# Patient Record
Sex: Female | Born: 1980 | Hispanic: No | Marital: Single | Smoking: Former smoker
Health system: Southern US, Community
[De-identification: ages and names within clinical notes are randomized; demographics above are authoritative.]

## PROBLEM LIST (undated history)

## (undated) DIAGNOSIS — J45909 Unspecified asthma, uncomplicated: Secondary | ICD-10-CM

## (undated) DIAGNOSIS — B1920 Unspecified viral hepatitis C without hepatic coma: Secondary | ICD-10-CM

## (undated) DIAGNOSIS — B9689 Other specified bacterial agents as the cause of diseases classified elsewhere: Secondary | ICD-10-CM

## (undated) DIAGNOSIS — N760 Acute vaginitis: Secondary | ICD-10-CM

## (undated) DIAGNOSIS — K047 Periapical abscess without sinus: Secondary | ICD-10-CM

## (undated) HISTORY — DX: Unspecified viral hepatitis C without hepatic coma: B19.20

## (undated) HISTORY — DX: Other specified bacterial agents as the cause of diseases classified elsewhere: B96.89

## (undated) HISTORY — DX: Other specified bacterial agents as the cause of diseases classified elsewhere: N76.0

## (undated) HISTORY — PX: TYMPANOSTOMY TUBE PLACEMENT: SHX32

---

## 2008-09-23 ENCOUNTER — Emergency Department (HOSPITAL_COMMUNITY): Admission: EM | Admit: 2008-09-23 | Discharge: 2008-09-23 | Payer: Self-pay | Admitting: Emergency Medicine

## 2009-03-24 ENCOUNTER — Emergency Department (HOSPITAL_COMMUNITY): Admission: EM | Admit: 2009-03-24 | Discharge: 2009-03-24 | Payer: Self-pay | Admitting: Orthopaedic Surgery

## 2009-03-25 ENCOUNTER — Emergency Department (HOSPITAL_COMMUNITY): Admission: EM | Admit: 2009-03-25 | Discharge: 2009-03-25 | Payer: Self-pay | Admitting: Emergency Medicine

## 2009-04-24 ENCOUNTER — Emergency Department (HOSPITAL_COMMUNITY): Admission: EM | Admit: 2009-04-24 | Discharge: 2009-04-24 | Payer: Self-pay | Admitting: Emergency Medicine

## 2009-11-30 ENCOUNTER — Emergency Department (HOSPITAL_COMMUNITY): Admission: EM | Admit: 2009-11-30 | Discharge: 2009-11-30 | Payer: Self-pay | Admitting: Emergency Medicine

## 2010-08-04 ENCOUNTER — Emergency Department (HOSPITAL_COMMUNITY)
Admission: EM | Admit: 2010-08-04 | Discharge: 2010-08-04 | Payer: Self-pay | Source: Home / Self Care | Admitting: Emergency Medicine

## 2010-08-04 LAB — WET PREP, GENITAL: Trich, Wet Prep: NONE SEEN

## 2010-08-04 LAB — CBC
MCH: 29.4 pg (ref 26.0–34.0)
MCHC: 32.4 g/dL (ref 30.0–36.0)
Platelets: 226 10*3/uL (ref 150–400)
RDW: 13.1 % (ref 11.5–15.5)

## 2010-08-04 LAB — URINALYSIS, ROUTINE W REFLEX MICROSCOPIC
Bilirubin Urine: NEGATIVE
Ketones, ur: NEGATIVE mg/dL
Nitrite: NEGATIVE
Protein, ur: NEGATIVE mg/dL

## 2010-08-06 LAB — GC/CHLAMYDIA PROBE AMP, GENITAL: Chlamydia, DNA Probe: NEGATIVE

## 2010-09-24 LAB — WET PREP, GENITAL
Clue Cells Wet Prep HPF POC: NONE SEEN
Trich, Wet Prep: NONE SEEN
Yeast Wet Prep HPF POC: NONE SEEN

## 2010-09-24 LAB — GC/CHLAMYDIA PROBE AMP, GENITAL: Chlamydia, DNA Probe: NEGATIVE

## 2010-09-24 LAB — URINALYSIS, ROUTINE W REFLEX MICROSCOPIC
Glucose, UA: NEGATIVE mg/dL
Hgb urine dipstick: NEGATIVE
Ketones, ur: NEGATIVE mg/dL
Nitrite: NEGATIVE
Protein, ur: NEGATIVE mg/dL
Specific Gravity, Urine: 1.029 (ref 1.005–1.030)
Urobilinogen, UA: 0.2 mg/dL (ref 0.0–1.0)
pH: 5 (ref 5.0–8.0)

## 2010-09-24 LAB — PREGNANCY, URINE: Preg Test, Ur: NEGATIVE

## 2010-10-11 LAB — GC/CHLAMYDIA PROBE AMP, GENITAL: Chlamydia, DNA Probe: NEGATIVE

## 2010-10-11 LAB — POCT PREGNANCY, URINE: Preg Test, Ur: NEGATIVE

## 2010-10-11 LAB — WET PREP, GENITAL: Clue Cells Wet Prep HPF POC: NONE SEEN

## 2010-10-12 LAB — RAPID STREP SCREEN (MED CTR MEBANE ONLY): Streptococcus, Group A Screen (Direct): NEGATIVE

## 2010-11-24 ENCOUNTER — Emergency Department (HOSPITAL_COMMUNITY): Payer: Medicaid Other

## 2010-11-24 ENCOUNTER — Emergency Department (HOSPITAL_COMMUNITY)
Admission: EM | Admit: 2010-11-24 | Discharge: 2010-11-24 | Disposition: A | Discharge status: Home / Self Care | Payer: Medicaid Other | Attending: Emergency Medicine | Admitting: Emergency Medicine

## 2010-11-24 DIAGNOSIS — J069 Acute upper respiratory infection, unspecified: Secondary | ICD-10-CM | POA: Insufficient documentation

## 2010-11-24 DIAGNOSIS — R079 Chest pain, unspecified: Secondary | ICD-10-CM | POA: Insufficient documentation

## 2010-11-24 DIAGNOSIS — R509 Fever, unspecified: Secondary | ICD-10-CM | POA: Insufficient documentation

## 2010-11-24 DIAGNOSIS — R05 Cough: Secondary | ICD-10-CM | POA: Insufficient documentation

## 2010-11-24 DIAGNOSIS — R059 Cough, unspecified: Secondary | ICD-10-CM | POA: Insufficient documentation

## 2010-11-24 DIAGNOSIS — R093 Abnormal sputum: Secondary | ICD-10-CM | POA: Insufficient documentation

## 2010-11-24 DIAGNOSIS — R071 Chest pain on breathing: Secondary | ICD-10-CM | POA: Insufficient documentation

## 2011-05-21 ENCOUNTER — Emergency Department (HOSPITAL_COMMUNITY)
Admission: EM | Admit: 2011-05-21 | Discharge: 2011-05-21 | Disposition: A | Discharge status: Home / Self Care | Payer: Medicaid Other | Attending: Emergency Medicine | Admitting: Emergency Medicine

## 2011-05-21 DIAGNOSIS — B9789 Other viral agents as the cause of diseases classified elsewhere: Secondary | ICD-10-CM | POA: Insufficient documentation

## 2011-05-21 DIAGNOSIS — R5383 Other fatigue: Secondary | ICD-10-CM | POA: Insufficient documentation

## 2011-05-21 DIAGNOSIS — B349 Viral infection, unspecified: Secondary | ICD-10-CM

## 2011-05-21 DIAGNOSIS — R05 Cough: Secondary | ICD-10-CM | POA: Insufficient documentation

## 2011-05-21 DIAGNOSIS — R110 Nausea: Secondary | ICD-10-CM | POA: Insufficient documentation

## 2011-05-21 DIAGNOSIS — R5381 Other malaise: Secondary | ICD-10-CM | POA: Insufficient documentation

## 2011-05-21 DIAGNOSIS — R109 Unspecified abdominal pain: Secondary | ICD-10-CM | POA: Insufficient documentation

## 2011-05-21 DIAGNOSIS — R197 Diarrhea, unspecified: Secondary | ICD-10-CM | POA: Insufficient documentation

## 2011-05-21 DIAGNOSIS — R059 Cough, unspecified: Secondary | ICD-10-CM | POA: Insufficient documentation

## 2011-05-21 LAB — URINALYSIS, ROUTINE W REFLEX MICROSCOPIC
Leukocytes, UA: NEGATIVE
Nitrite: NEGATIVE
Specific Gravity, Urine: 1.026 (ref 1.005–1.030)
Urobilinogen, UA: 1 mg/dL (ref 0.0–1.0)
pH: 6 (ref 5.0–8.0)

## 2011-05-21 LAB — PREGNANCY, URINE: Preg Test, Ur: NEGATIVE

## 2011-05-21 MED ORDER — OXYCODONE-ACETAMINOPHEN 5-325 MG PO TABS: 1.0000 | ORAL_TABLET | ORAL | Status: AC | PRN

## 2011-05-21 MED ORDER — OXYCODONE-ACETAMINOPHEN 5-325 MG PO TABS
1.0000 | ORAL_TABLET | Freq: Once | ORAL | Status: DC
  Filled 2011-05-21: qty 1

## 2011-05-21 NOTE — ED Provider Notes (Signed)
History     CSN: [id number] Arrival date & time: 05/21/2011  3:15 PM   None     Chief Complaint  Patient presents with  . Abdominal Pain    (Consider location/radiation/quality/duration/timing/severity/associated sxs/prior treatment) Patient is a 30 y.o. female presenting with abdominal pain. The history is provided by the patient.  Abdominal Pain The primary symptoms of the illness include abdominal pain, fatigue, nausea and diarrhea. The primary symptoms of the illness do not include shortness of breath. The current episode started more than 2 days ago. The onset of the illness was gradual.  Symptoms associated with the illness do not include back pain.   patient has had nauseousness and some diarrhea the last few days. She is gravida itchy throat is not painful. She states her daughters had the same symptoms. She has an occasional cough. Mild abdominal pain. No rebound or guarding. She has no relief with aspirin ibuprofen. She states her muscles do not hurt.  History reviewed. No pertinent past medical history.  Past Surgical History  Procedure Date  . Tympanostomy tube placement     History reviewed. No pertinent family history.  History  Substance Use Topics  . Smoking status: Never Smoker   . Smokeless tobacco: Not on file  . Alcohol Use: No    OB History    Grav Para Term Preterm Abortions TAB SAB Ect Mult Living                  Review of Systems  Constitutional: Positive for appetite change and fatigue. Negative for activity change.  HENT: Negative for rhinorrhea, trouble swallowing, neck stiffness and sinus pressure.   Eyes: Negative for pain.  Respiratory: Negative for chest tightness and shortness of breath.   Cardiovascular: Negative for chest pain and leg swelling.  Gastrointestinal: Positive for nausea, abdominal pain and diarrhea.  Genitourinary: Negative for flank pain.       Patient states that she could be pregnant. Her last period was early in  the month.  Musculoskeletal: Negative for myalgias and back pain.  Skin: Negative for rash.  Neurological: Negative for weakness, numbness and headaches.  Psychiatric/Behavioral: Negative for behavioral problems.    Allergies  Review of patient's allergies indicates no known allergies.  Home Medications   Current Outpatient Rx  Name Route Sig Dispense Refill  . ASPIRIN 325 MG PO TABS Oral Take 325 mg by mouth daily as needed. For pain     . IBUPROFEN 200 MG PO TABS Oral Take 400 mg by mouth every 6 (six) hours as needed. For pain     . OXYCODONE-ACETAMINOPHEN 5-325 MG PO TABS Oral Take 1 tablet by mouth every 4 (four) hours as needed for pain. 15 tablet 0    BP 117/76  Pulse 104  Temp(Src) 99 F (37.2 C) (Oral)  Resp 20  SpO2 100%  LMP 05/09/2011  Physical Exam  Nursing note and vitals reviewed. Constitutional: She is oriented to person, place, and time. She appears well-developed and well-nourished.  HENT:  Head: Normocephalic and atraumatic.  Eyes: EOM are normal. Pupils are equal, round, and reactive to light.  Neck: Normal range of motion. Neck supple. No tracheal deviation present. No thyromegaly present.  Cardiovascular: Normal rate, regular rhythm and normal heart sounds.   No murmur heard. Pulmonary/Chest: Effort normal and breath sounds normal. No respiratory distress. She has no wheezes. She has no rales.  Abdominal: Soft. Bowel sounds are normal. She exhibits no distension. There is no tenderness. There  is no rebound and no guarding.  Musculoskeletal: Normal range of motion.  Lymphadenopathy:    She has no cervical adenopathy.  Neurological: She is alert and oriented to person, place, and time. No cranial nerve deficit.  Skin: Skin is warm and dry.  Psychiatric: She has a normal mood and affect. Her speech is normal.    ED Course  Procedures (including critical care time)  Labs Reviewed  URINALYSIS, ROUTINE W REFLEX MICROSCOPIC - Abnormal; Notable for the  following:    Bilirubin Urine SMALL (*)    Ketones, ur 40 (*)    All other components within normal limits  PREGNANCY, URINE   No results found.   1. Viral infection       MDM  Abdominal pain and sore throat. Feeling bad overall. She's had recent sick contacts. Some mild dehydration on urinalysis. She's tolerating orals she'll be discharged home.        Juliet Rude. Rubin Payor, MD 05/21/11 1704

## 2011-05-21 NOTE — ED Notes (Signed)
Pt c/o abd pain and an "itchy, scratchy" throat.

## 2011-08-13 ENCOUNTER — Emergency Department (HOSPITAL_COMMUNITY)
Admission: EM | Admit: 2011-08-13 | Discharge: 2011-08-13 | Disposition: A | Discharge status: Home / Self Care | Payer: Self-pay | Attending: Emergency Medicine | Admitting: Emergency Medicine

## 2011-08-13 ENCOUNTER — Encounter (HOSPITAL_COMMUNITY): Payer: Self-pay | Admitting: *Deleted

## 2011-08-13 ENCOUNTER — Emergency Department (HOSPITAL_COMMUNITY): Payer: Self-pay

## 2011-08-13 DIAGNOSIS — R059 Cough, unspecified: Secondary | ICD-10-CM | POA: Insufficient documentation

## 2011-08-13 DIAGNOSIS — J069 Acute upper respiratory infection, unspecified: Secondary | ICD-10-CM | POA: Insufficient documentation

## 2011-08-13 DIAGNOSIS — R070 Pain in throat: Secondary | ICD-10-CM | POA: Insufficient documentation

## 2011-08-13 DIAGNOSIS — J3489 Other specified disorders of nose and nasal sinuses: Secondary | ICD-10-CM | POA: Insufficient documentation

## 2011-08-13 DIAGNOSIS — R05 Cough: Secondary | ICD-10-CM | POA: Insufficient documentation

## 2011-08-13 NOTE — ED Provider Notes (Signed)
History     CSN: [id number]  Arrival date & time 08/13/11  1350   First MD Initiated Contact with Patient 08/13/11 1546      Chief Complaint  Patient presents with  . URI    (Consider location/radiation/quality/duration/timing/severity/associated sxs/prior treatment) HPI Comments: Also today with a cough she's had some inspiratory chest pain. It is diffuse a 3/10 and sharp in nature.  Patient is a 31 y.o. female presenting with URI. The history is provided by the patient.  URI The primary symptoms include sore throat and cough. Primary symptoms do not include fever, ear pain, abdominal pain, nausea or vomiting. The current episode started 3 to 5 days ago. This is a new problem. The problem has not changed since onset. The cough began 3 to 5 days ago. The cough is non-productive, dry and hacking.  The onset of the illness is associated with exposure to sick contacts. Symptoms associated with the illness include congestion and rhinorrhea. The illness is not associated with sinus pressure.    History reviewed. No pertinent past medical history.  Past Surgical History  Procedure Date  . Tympanostomy tube placement     No family history on file.  History  Substance Use Topics  . Smoking status: Former Games developer  . Smokeless tobacco: Not on file  . Alcohol Use: No    OB History    Grav Para Term Preterm Abortions TAB SAB Ect Mult Living                  Review of Systems  Constitutional: Negative for fever.  HENT: Positive for congestion, sore throat and rhinorrhea. Negative for ear pain and sinus pressure.   Respiratory: Positive for cough.   Gastrointestinal: Negative for nausea, vomiting and abdominal pain.  All other systems reviewed and are negative.    Allergies  Review of patient's allergies indicates no known allergies.  Home Medications   Current Outpatient Rx  Name Route Sig Dispense Refill  . ASPIRIN 325 MG PO TABS Oral Take 325 mg by mouth daily as  needed. For pain     . IBUPROFEN 200 MG PO TABS Oral Take 400 mg by mouth every 6 (six) hours as needed. For pain       BP 131/85  Pulse 79  Temp(Src) 98 F (36.7 C) (Oral)  Resp 24  Ht 5\' 9"  (1.753 m)  Wt 280 lb (127.007 kg)  BMI 41.35 kg/m2  SpO2 100%  LMP 07/23/2011  Physical Exam  Nursing note and vitals reviewed. Constitutional: She is oriented to person, place, and time. She appears well-developed and well-nourished. No distress.  HENT:  Head: Normocephalic and atraumatic.  Right Ear: Tympanic membrane and ear canal normal.  Left Ear: Tympanic membrane and ear canal normal.  Nose: Mucosal edema and rhinorrhea present.  Mouth/Throat: Posterior oropharyngeal erythema present.  Eyes: Conjunctivae and EOM are normal. Pupils are equal, round, and reactive to light.  Neck: Normal range of motion. Neck supple.  Cardiovascular: Normal rate, regular rhythm and intact distal pulses.   No murmur heard. Pulmonary/Chest: Effort normal and breath sounds normal. No respiratory distress. She has no wheezes. She has no rales.  Abdominal: Soft. She exhibits no distension. There is no tenderness. There is no rebound and no guarding.  Musculoskeletal: Normal range of motion. She exhibits no edema and no tenderness.  Neurological: She is alert and oriented to person, place, and time.  Skin: Skin is warm and dry. No rash noted. No erythema.  Psychiatric: She has a normal mood and affect. Her behavior is normal.    ED Course  Procedures (including critical care time)  Labs Reviewed - No data to display Dg Chest 2 View  08/13/2011  *RADIOLOGY REPORT*  Clinical Data: Cough and chest pain for 3 days.  CHEST - 2 VIEW  Comparison: None.  Findings: Normal heart size with clear lung fields.  No bony abnormality.  No change from priors.  IMPRESSION: No active cardiopulmonary disease.  Original Report Authenticated By: Elsie Stain, M.D.     No diagnosis found.    MDM   Pt with symptoms  consistent with viral URI.  Well appearing here.  No signs of breathing difficulty  No signs of pharyngitis, otitis or abnormal abdominal findings.   CXR wnl and pt to return with any further problems.         Gwyneth Sprout, MD 08/13/11 [phone]

## 2011-08-13 NOTE — ED Notes (Signed)
Pt is her with cold symptoms nose drainage sneezing and then congestion and pain with deep breath in chest

## 2011-09-05 ENCOUNTER — Emergency Department (HOSPITAL_COMMUNITY)
Admission: EM | Admit: 2011-09-05 | Discharge: 2011-09-05 | Disposition: A | Discharge status: Home / Self Care | Payer: Self-pay | Attending: Emergency Medicine | Admitting: Emergency Medicine

## 2011-09-05 ENCOUNTER — Encounter (HOSPITAL_COMMUNITY): Payer: Self-pay | Admitting: Emergency Medicine

## 2011-09-05 DIAGNOSIS — M542 Cervicalgia: Secondary | ICD-10-CM | POA: Insufficient documentation

## 2011-09-05 DIAGNOSIS — K0381 Cracked tooth: Secondary | ICD-10-CM | POA: Insufficient documentation

## 2011-09-05 DIAGNOSIS — R599 Enlarged lymph nodes, unspecified: Secondary | ICD-10-CM | POA: Insufficient documentation

## 2011-09-05 DIAGNOSIS — K029 Dental caries, unspecified: Secondary | ICD-10-CM | POA: Insufficient documentation

## 2011-09-05 DIAGNOSIS — S025XXA Fracture of tooth (traumatic), initial encounter for closed fracture: Secondary | ICD-10-CM

## 2011-09-05 DIAGNOSIS — K089 Disorder of teeth and supporting structures, unspecified: Secondary | ICD-10-CM | POA: Insufficient documentation

## 2011-09-05 DIAGNOSIS — K0889 Other specified disorders of teeth and supporting structures: Secondary | ICD-10-CM

## 2011-09-05 LAB — POCT PREGNANCY, URINE: Preg Test, Ur: NEGATIVE

## 2011-09-05 MED ORDER — PENICILLIN V POTASSIUM 500 MG PO TABS: 500.0000 mg | ORAL_TABLET | Freq: Three times a day (TID) | ORAL | Status: AC

## 2011-09-05 MED ORDER — HYDROCODONE-ACETAMINOPHEN 7.5-325 MG/15ML PO SOLN: 15.0000 mL | ORAL | Status: AC | PRN

## 2011-09-05 MED ORDER — IBUPROFEN 800 MG PO TABS
800.0000 mg | ORAL_TABLET | Freq: Once | ORAL | Status: AC
  Administered 2011-09-05: 800 mg via ORAL
  Filled 2011-09-05: qty 1

## 2011-09-05 MED ORDER — HYDROCODONE-ACETAMINOPHEN 5-325 MG PO TABS
1.0000 | ORAL_TABLET | Freq: Once | ORAL | Status: AC
  Administered 2011-09-05: 1 via ORAL
  Filled 2011-09-05: qty 1

## 2011-09-05 NOTE — ED Notes (Signed)
Pt st's she has had sore throat since this am

## 2011-09-05 NOTE — Discharge Instructions (Signed)
Please read over the instructions below. The swelling lymph node in your left neck is likely a response to your recent cold symptoms or your infected tooth on the left upper side. We are starting you on an antibiotic to treat the tooth for several days before your follow up with his dentist. Take as directed and be sure to finish. If the swollen lymph nodes persist after you have had the tooth pulled you will need to follow up with an ear nose and throat physician we have provided the referral. We have also attached several pages of free and/or low-cost dental resources in the community for you to arrange follow up with a dentist. We have also provided primary care referrals as discussed.  Dental Caries  Tooth decay (dental caries, cavities) is the most common of all oral diseases. It occurs in all ages but is more common in children and young adults.  CAUSES  Bacteria in your mouth combine with foods (particularly sugars and starches) to produce plaque. Plaque is a substance that sticks to the hard surfaces of teeth. The bacteria in the plaque produce acids that attack the enamel of teeth. Repeated acid attacks dissolve the enamel and create holes in the teeth. Root surfaces of teeth may also get these holes.  Other contributing factors include:   Frequent snacking and drinking of cavity-producing foods and liquids.   Poor oral hygiene.   Dry mouth.   Substance abuse such as methamphetamine.   Broken or poor fitting dental restorations.   Eating disorders.   Gastroesophageal reflux disease (GERD).   Certain radiation treatments to the head and neck.  SYMPTOMS  At first, dental decay appears as white, chalky areas on the enamel. In this early stage, symptoms are seldom present. As the decay progresses, pits and holes may appear on the enamel surfaces. Progression of the decay will lead to softening of the hard layers of the tooth. At this point you may experience some pain or achy feeling  after sweet, hot, or cold foods or drinks are consumed. If left untreated, the decay will reach the internal structures of the tooth and produce severe pain. Extensive dental treatment, such as root canal therapy, may be needed to save the tooth at this late stage of decay development.  DIAGNOSIS  Most cavities will be detected during regular check-ups. A thorough medical and dental history will be taken by the dentist. The dentist will use instruments to check the surfaces of your teeth for any breakdown or discoloration. Some dentists have special instruments, such as lasers, that detect tooth decay. Dental X-rays may also show some cavities that are not visible to the eye (such as between the contact areas of the teeth). TREATMENT  Treatment involves removal of the tooth decay and replacement with a restorative material such as silver, gold, or composite (white) material. However, if the decay involves a large area of the tooth and there is little remaining healthy tooth structure, a cap (crown) will be fitted over the remaining structure. If the decay involves the center part of the tooth (pulp), root canal treatment will be needed before any type of dental restoration is placed. If the tooth is severely destroyed by the decay process, leaving the remaining tooth structures unrestorable, the tooth will need to be pulled (extracted). Some early tooth decay may be reversed by fluoride treatments and thorough brushing and flossing at home. PREVENTION   Eat healthy foods. Restrict the amount of sugary, starchy foods and liquids you  consume. Avoid frequent snacking and drinking of unhealthy foods and liquids.   Sealants can help with prevention of cavities. Sealants are composite resins applied onto the biting surfaces of teeth at risk for decay. They smooth out the pits and grooves and prevent food from being trapped in them. This is done in early childhood before tooth decay has started.   Fluoride  tablets may also be prescribed to children between 6 months and 65 years of age if your drinking water is not fluoridated. The fluoride absorbed by the tooth enamel makes teeth less susceptible to decay. Thorough daily cleaning with a toothbrush and dental floss is the best way to prevent cavities. Use of a fluoride toothpaste is highly recommended. Fluoride mouth rinses may be used in specific cases.   Topical application of fluoride by your dentist is important in children.   Regular visits with a dentist for checkups and cleanings are also important.  SEEK IMMEDIATE DENTAL CARE IF:  You have a fever.   You develop redness and swelling of your face, jaw, or neck.   You develop swelling around a tooth.   You are unable to open your mouth or cannot swallow.   You have severe pain uncontrolled by pain medicine.  Document Released: 03/16/2002 Document Revised: 03/06/2011 Document Reviewed: 11/29/2010 [facility] Patient Information 2012 [place], [place]  .Dental Fracture You have a dental fracture or injury. This can mean the tooth is loose, has a chip in the enamel or is broken. If just the outer enamel is chipped, there is a good chance the tooth will not become infected. The only treatment needed may be to smooth off a rough edge. Fractures into the deeper layers (dentin and pulp) cause greater pain and are more likely to become infected. These require you to see a dentist as soon as possible to save the tooth. Loose teeth may need to be wired or bonded with a plastic splint to hold them in place. A paste may be painted on the open area of the broken tooth to reduce the pain. Antibiotics and pain medicine may be prescribed. Choosing a soft or liquid diet and rinsing the mouth out with warm water after meals may be helpful. See your dentist as recommended. Failure to seek care or follow up with a dentist or other specialist as recommended could result in the loss of your tooth, infection, or  permanent dental problems. SEEK MEDICAL CARE IF:   You have increased pain not controlled with medicines.   You have swelling around the tooth, in the face or neck.   You have bleeding which starts, continues, or gets worse.   You have a fever.  Document Released: 08/01/2004 Document Revised: 03/06/2011 Document Reviewed: 05/16/2009 [facility] Patient Information 2012 [place], [place].  Lymphadenopathy Lymphadenopathy means "disease of the lymph glands." But the term is usually used to describe swollen or enlarged lymph glands, also called lymph nodes. These are the bean-shaped organs found in many locations including the neck, underarm, and groin. Lymph glands are part of the immune system, which fights infections in your body. Lymphadenopathy can occur in just one area of the body, such as the neck, or it can be generalized, with lymph node enlargement in several areas. The nodes found in the neck are the most common sites of lymphadenopathy. CAUSES  When your immune system responds to germs (such as viruses or bacteria ), infection-fighting cells and fluid build up. This causes the glands to grow in size. This is usually  not something to worry about. Sometimes, the glands themselves can become infected and inflamed. This is called lymphadenitis. Enlarged lymph nodes can be caused by many diseases:  Bacterial disease, such as strep throat or a skin infection.   Viral disease, such as a common cold.   Other germs, such as lyme disease, tuberculosis, or sexually transmitted diseases.   Cancers, such as lymphoma (cancer of the lymphatic system) or leukemia (cancer of the white blood cells).   Inflammatory diseases such as lupus or rheumatoid arthritis.   Reactions to medications.  Many of the diseases above are rare, but important. This is why you should see your caregiver if you have lymphadenopathy. SYMPTOMS   Swollen, enlarged lumps in the neck, back of the head or other locations.     Tenderness.   Warmth or redness of the skin over the lymph nodes.   Fever.  DIAGNOSIS  Enlarged lymph nodes are often near the source of infection. They can help healthcare providers diagnose your illness. For instance:   Swollen lymph nodes around the jaw might be caused by an infection in the mouth.   Enlarged glands in the neck often signal a throat infection.   Lymph nodes that are swollen in more than one area often indicate an illness caused by a virus.  Your caregiver most likely will know what is causing your lymphadenopathy after listening to your history and examining you. Blood tests, x-rays or other tests may be needed. If the cause of the enlarged lymph node cannot be found, and it does not go away by itself, then a biopsy may be needed. Your caregiver will discuss this with you. TREATMENT  Treatment for your enlarged lymph nodes will depend on the cause. Many times the nodes will shrink to normal size by themselves, with no treatment. Antibiotics or other medicines may be needed for infection. Only take over-the-counter or prescription medicines for pain, discomfort or fever as directed by your caregiver. HOME CARE INSTRUCTIONS  Swollen lymph glands usually return to normal when the underlying medical condition goes away. If they persist, contact your health-care provider. He/she might prescribe antibiotics or other treatments, depending on the diagnosis. Take any medications exactly as prescribed. Keep any follow-up appointments made to check on the condition of your enlarged nodes.  SEEK MEDICAL CARE IF:   Swelling lasts for more than two weeks.   You have symptoms such as weight loss, night sweats, fatigue or fever that does not go away.   The lymph nodes are hard, seem fixed to the skin or are growing rapidly.   Skin over the lymph nodes is red and inflamed. This could mean there is an infection.  SEEK IMMEDIATE MEDICAL CARE IF:   Fluid starts leaking from the area  of the enlarged lymph node.   You develop a fever of 102 F (38.9 C) or greater.   Severe pain develops (not necessarily at the site of a large lymph node).   You develop chest pain or shortness of breath.   You develop worsening abdominal pain.  MAKE SURE YOU:   Understand these instructions.   Will watch your condition.   Will get help right away if you are not doing well or get worse.  Document Released: 04/02/2008 Document Revised: 03/06/2011 Document Reviewed: 04/02/2008 [facility] Patient Information 2012 [place], [place].  RESOURCE GUIDE  Dental Problems  Patients with Medicaid: [facility]  [facility] [address]                                           [phone] [address] [employer] [address] Phone:  [phone]                                                  Phone:  [phone]  If unable to pay or uninsured, contact:  [facility] or [facility]. to become qualified for the adult dental clinic.  Chronic Pain Problems Contact [facility]  [phone] Patients need to be referred by their primary care doctor.  Insufficient Money for Medicine Contact [employer]:  call "211" or [facility] 307-213-2929.  No Primary Care Doctor Call [facility]  6231910914 Other agencies that provide inexpensive medical care    [facility]  [phone]    [facility]  [phone]    [facility]  [phone]    [facility]  [phone]    Planned Parenthood  [phone]    [facility]  [phone]  Psychological Services [facility] Behavioral Health  [phone] [facility]  [phone] [facility] Mental Health   [phone] (emergency services 201-615-8293)  Substance Abuse Resources [facility]  (507)688-3316 [facility] [phone] [facility] [place] [phone] Floydene Flock 209-494-3612 Residential & Outpatient Substance Abuse Program   [phone]  Abuse/Neglect [facility] Child Abuse Hotline (205)579-2354 [facility] Child Abuse Hotline 647-761-0623 (After Hours)  Emergency Shelter [facility] 959-398-7016  Maternity Homes [facility] [place] [facility] 6846933397 [facility] Services [phone]  MRSA Hotline #:   762-178-2446    [facility] Resources  [facility]     [employer]                          [facility] Dept. [address] [place]                       [address]      [address]  Blondell Reveal Phone:  [phone]                                   Phone:  [phone]                 Phone:  [phone]  [facility] Mental Health Phone:  [phone]  [facility] Child Abuse Hotline 817-785-8201 [phone] (After Hours)

## 2011-09-05 NOTE — ED Notes (Addendum)
Pt reports having a 'bump' under her chin that she noticed hurts this morning when she messes with it.  States that she has had a runny nose.  Denies sore throat.  Pt noted to have a small lymph node that is swollen under her chin.

## 2011-09-07 NOTE — ED Provider Notes (Signed)
History     CSN: [id number]  Arrival date & time 09/05/11  1710   First MD Initiated Contact with Patient 09/05/11 2118      Chief Complaint  Patient presents with  . Sore Throat     Patient is a 31 y.o. female presenting with pharyngitis.  Sore Throat This is a new problem. The current episode started today. The problem occurs constantly. The problem has been gradually worsening. Associated symptoms include neck pain and swollen glands. Pertinent negatives include no fever or sore throat. She has tried nothing for the symptoms.  Patient reports she awoke this morning with a painful, swollen nodule to her left neck. Admits to cold symptoms for approximately one week. Was seen in ED 08/13/2011 for cold sx's and dx'd w/ a viral URI. Also states she has had intermittent tooth pain to the left upper tooth that has worsened over the last one to 2 days,  due to a dental carie and partial tooth fracture fracture.   History reviewed. No pertinent past medical history.  Past Surgical History  Procedure Date  . Tympanostomy tube placement     History reviewed. No pertinent family history.  History  Substance Use Topics  . Smoking status: Former Games developer  . Smokeless tobacco: Not on file  . Alcohol Use: No    OB History    Grav Para Term Preterm Abortions TAB SAB Ect Mult Living                  Review of Systems  Constitutional: Negative.  Negative for fever.  HENT: Positive for neck pain. Negative for sore throat.   Eyes: Negative.   Respiratory: Negative.   Cardiovascular: Negative.   Gastrointestinal: Negative.   Genitourinary: Negative.   Skin: Negative.   Neurological: Negative.   Hematological: Negative.   Psychiatric/Behavioral: Negative.     Allergies  Review of patient's allergies indicates no known allergies.  Home Medications   Current Outpatient Rx  Name Route Sig Dispense Refill  . OVER THE COUNTER MEDICATION Oral Take 2 capsules by mouth 2 (two) times  daily as needed. For allergies  Over the counter allergy medication    . HYDROCODONE-ACETAMINOPHEN 7.5-325 MG/15ML PO SOLN Oral Take 15 mLs by mouth every 4 (four) hours as needed for pain. 50 mL 0  . PENICILLIN V POTASSIUM 500 MG PO TABS Oral Take 1 tablet (500 mg total) by mouth 3 (three) times daily. 30 tablet 0    BP 131/78  Pulse 78  Temp(Src) 98 F (36.7 C) (Oral)  Resp 20  SpO2 96%  LMP 07/23/2011  Physical Exam  Constitutional: She is oriented to person, place, and time. She appears well-developed and well-nourished.  HENT:  Head: Normocephalic and atraumatic.  Right Ear: Hearing, tympanic membrane, external ear and ear canal normal.  Left Ear: Hearing, tympanic membrane, external ear and ear canal normal.  Nose: Nose normal.  Mouth/Throat: Uvula is midline, oropharynx is clear and moist and mucous membranes are normal.    Eyes: Conjunctivae are normal.  Neck: Neck supple.         Small approx .5 cm swollen, anterior cerv lymph node to (L) neck that is TTP. No soft tissue swelling or erythema.  Cardiovascular: Normal rate and regular rhythm.   Pulmonary/Chest: Effort normal and breath sounds normal.  Abdominal: Soft. Bowel sounds are normal.  Musculoskeletal: Normal range of motion.  Neurological: She is alert and oriented to person, place, and time.  Skin: Skin is  warm and dry. No erythema.  Psychiatric: She has a normal mood and affect.    ED Course  Procedures   Findings and clinical impression discussed with patient. Will treat with antibiotics for obvious tooth decay and possible abscess to left upper second bicuspid. Will prescribe a short course of medication for pain and provide referrals for dental follow up.   Labs Reviewed  POCT PREGNANCY, URINE  LAB REPORT - SCANNED   No results found.   1. Swollen lymph nodes   2. Pain, dental   3. Dental caries   4. Fracture of tooth       MDM  HPI/PE and clinical findings c/w 1. lymphadenopathy  (swollen, tender .5 cm (L) cervical lymph node likely related to URI and/or advanced dental carie to (L) upper 2nd bicuspid 2. Dental carie/old dental fracture/Dental pain        Leanne Chang, NP 09/08/11 [phone]

## 2011-09-08 NOTE — ED Provider Notes (Signed)
Medical screening examination/treatment/procedure(s) were performed by non-physician practitioner and as supervising physician I was immediately available for consultation/collaboration.  Juliet Rude. Rubin Payor, MD 09/08/11 1610

## 2012-02-04 ENCOUNTER — Encounter (HOSPITAL_COMMUNITY): Payer: Self-pay

## 2012-02-04 ENCOUNTER — Emergency Department (HOSPITAL_COMMUNITY)
Admission: EM | Admit: 2012-02-04 | Discharge: 2012-02-04 | Disposition: A | Discharge status: Home / Self Care | Payer: Self-pay | Attending: Emergency Medicine | Admitting: Emergency Medicine

## 2012-02-04 DIAGNOSIS — M545 Low back pain, unspecified: Secondary | ICD-10-CM | POA: Insufficient documentation

## 2012-02-04 DIAGNOSIS — Z87891 Personal history of nicotine dependence: Secondary | ICD-10-CM | POA: Insufficient documentation

## 2012-02-04 DIAGNOSIS — M549 Dorsalgia, unspecified: Secondary | ICD-10-CM

## 2012-02-04 LAB — POCT I-STAT, CHEM 8
BUN: 12 mg/dL (ref 6–23)
Calcium, Ion: 1.23 mmol/L (ref 1.12–1.23)
Chloride: 104 meq/L (ref 96–112)
Creatinine, Ser: 1 mg/dL (ref 0.50–1.10)
Glucose, Bld: 85 mg/dL (ref 70–99)
HCT: 42 % (ref 36.0–46.0)
Hemoglobin: 14.3 g/dL (ref 12.0–15.0)
Potassium: 3.9 meq/L (ref 3.5–5.1)
Sodium: 138 meq/L (ref 135–145)
TCO2: 23 mmol/L (ref 0–100)

## 2012-02-04 LAB — CBC WITH DIFFERENTIAL/PLATELET
Hemoglobin: 13.1 g/dL (ref 12.0–15.0)
Lymphocytes Relative: 39 % (ref 12–46)
Lymphs Abs: 2.4 10*3/uL (ref 0.7–4.0)
MCV: 86.7 fL (ref 78.0–100.0)
Monocytes Relative: 7 % (ref 3–12)
Neutrophils Relative %: 50 % (ref 43–77)
Platelets: 205 10*3/uL (ref 150–400)
RBC: 4.58 MIL/uL (ref 3.87–5.11)
WBC: 6.2 10*3/uL (ref 4.0–10.5)

## 2012-02-04 LAB — URINALYSIS, ROUTINE W REFLEX MICROSCOPIC
Hgb urine dipstick: NEGATIVE
Nitrite: NEGATIVE
Specific Gravity, Urine: 1.023 (ref 1.005–1.030)
Urobilinogen, UA: 1 mg/dL (ref 0.0–1.0)
pH: 7 (ref 5.0–8.0)

## 2012-02-04 LAB — URINE MICROSCOPIC-ADD ON

## 2012-02-04 LAB — POCT PREGNANCY, URINE: Preg Test, Ur: NEGATIVE

## 2012-02-04 MED ORDER — IBUPROFEN 800 MG PO TABS: 800.0000 mg | ORAL_TABLET | Freq: Three times a day (TID) | ORAL | Status: AC

## 2012-02-04 MED ORDER — CYCLOBENZAPRINE HCL 10 MG PO TABS
10.0000 mg | ORAL_TABLET | Freq: Once | ORAL | Status: AC
  Administered 2012-02-04: 10 mg via ORAL
  Filled 2012-02-04: qty 1

## 2012-02-04 MED ORDER — KETOROLAC TROMETHAMINE 60 MG/2ML IM SOLN
60.0000 mg | Freq: Once | INTRAMUSCULAR | Status: AC
  Administered 2012-02-04: 60 mg via INTRAMUSCULAR
  Filled 2012-02-04: qty 2

## 2012-02-04 MED ORDER — CYCLOBENZAPRINE HCL 10 MG PO TABS: 10.0000 mg | ORAL_TABLET | Freq: Three times a day (TID) | ORAL | Status: AC | PRN

## 2012-02-04 NOTE — ED Notes (Signed)
Pt complains of left flank pain with any position, onset yesterday.

## 2012-02-04 NOTE — ED Provider Notes (Signed)
History     CSN: [id number]  Arrival date & time 02/04/12  1058   First MD Initiated Contact with Patient 02/04/12 1258      Chief Complaint  Patient presents with  . Flank Pain    (Consider location/radiation/quality/duration/timing/severity/associated sxs/prior treatment) Patient is a 31 y.o. female presenting with back pain. The history is provided by the patient. No language interpreter was used.  Back Pain  This is a new problem. The current episode started 12 to 24 hours ago. The problem occurs hourly. The problem has not changed since onset.The pain is associated with no known injury. Pain location: L para lumbar pain. The quality of the pain is described as aching and burning. Radiates to: L buttocks. The pain is at a severity of 7/10. The pain is moderate. The symptoms are aggravated by bending, twisting and certain positions. The pain is the same all the time. Pertinent negatives include no fever, no bowel incontinence, no perianal numbness, no bladder incontinence, no paresthesias, no paresis, no tingling and no weakness. She has tried nothing for the symptoms.  LL back pain that radiates into her L buttocks.  No numbness/ tingling  to lower extremities.  No bowel or bladder incontinence.  Taken nothing for pain.    No past medical history on file.  Past Surgical History  Procedure Date  . Tympanostomy tube placement     No family history on file.  History  Substance Use Topics  . Smoking status: Former Games developer  . Smokeless tobacco: Not on file  . Alcohol Use: No    OB History    Grav Para Term Preterm Abortions TAB SAB Ect Mult Living                  Review of Systems  Constitutional: Negative.  Negative for fever.  HENT: Negative.   Eyes: Negative.   Respiratory: Negative.   Cardiovascular: Negative.   Gastrointestinal: Negative.  Negative for bowel incontinence.  Genitourinary: Negative for bladder incontinence.  Musculoskeletal: Positive for back  pain. Negative for gait problem.  Neurological: Negative.  Negative for tingling, weakness and paresthesias.  Psychiatric/Behavioral: Negative.   All other systems reviewed and are negative.    Allergies  Review of patient's allergies indicates no known allergies.  Home Medications  No current outpatient prescriptions on file.  BP 138/76  Pulse 71  Temp 99 F (37.2 C) (Oral)  Resp 18  SpO2 100%  Physical Exam  Nursing note and vitals reviewed. Constitutional: She is oriented to person, place, and time. She appears well-developed and well-nourished.  HENT:  Head: Normocephalic and atraumatic.  Eyes: Conjunctivae and EOM are normal. Pupils are equal, round, and reactive to light.  Neck: Normal range of motion. Neck supple.  Cardiovascular: Normal rate.   Pulmonary/Chest: Effort normal.  Abdominal: Soft.  Musculoskeletal: Normal range of motion. She exhibits tenderness. She exhibits no edema.       L lower back tenderness L buttocks tenderness.  Ambulating without difficulty.    Neurological: She is alert and oriented to person, place, and time. She has normal reflexes. No cranial nerve deficit. Coordination normal.  Skin: Skin is warm and dry.  Psychiatric: She has a normal mood and affect.    ED Course  Procedures (including critical care time)  Labs Reviewed  URINALYSIS, ROUTINE W REFLEX MICROSCOPIC - Abnormal; Notable for the following:    Leukocytes, UA TRACE (*)     All other components within normal limits  URINE MICROSCOPIC-ADD  ON - Abnormal; Notable for the following:    Squamous Epithelial / LPF FEW (*)     All other components within normal limits  CBC WITH DIFFERENTIAL  POCT PREGNANCY, URINE  POCT I-STAT, CHEM 8   No results found.   No diagnosis found.    MDM  31yo female with LL back pain and radiation to L buttocks.  No red flags or cauda equina symptoms.  Better after ibuprofen in the ER.  rx for muscle relaxor as well.  Follow up with pcp of  choice or one from list.   Labs Reviewed  URINALYSIS, ROUTINE W REFLEX MICROSCOPIC - Abnormal; Notable for the following:    Leukocytes, UA TRACE (*)     All other components within normal limits  URINE MICROSCOPIC-ADD ON - Abnormal; Notable for the following:    Squamous Epithelial / LPF FEW (*)     All other components within normal limits  CBC WITH DIFFERENTIAL  POCT PREGNANCY, URINE  POCT I-STAT, CHEM 8  LAB REPORT - SCANNED          Remi Haggard, NP 02/05/12 1320

## 2012-02-04 NOTE — ED Notes (Signed)
Left side flank pain hurting for a few days denies dysuria vag d/c staes has not started her period this month and states lmp was last of June states she has reg periods and does not use birth control

## 2012-02-07 NOTE — ED Provider Notes (Signed)
Medical screening examination/treatment/procedure(s) were performed by non-physician practitioner and as supervising physician I was immediately available for consultation/collaboration.  Farrah Skoda R. Driana Dazey, MD 02/07/12 1053 

## 2012-07-30 ENCOUNTER — Encounter (HOSPITAL_COMMUNITY): Payer: Self-pay | Admitting: Emergency Medicine

## 2012-07-30 ENCOUNTER — Emergency Department (HOSPITAL_COMMUNITY)
Admission: EM | Admit: 2012-07-30 | Discharge: 2012-07-30 | Disposition: A | Discharge status: Home / Self Care | Payer: Self-pay | Attending: Emergency Medicine | Admitting: Emergency Medicine

## 2012-07-30 DIAGNOSIS — Z3202 Encounter for pregnancy test, result negative: Secondary | ICD-10-CM | POA: Insufficient documentation

## 2012-07-30 DIAGNOSIS — K089 Disorder of teeth and supporting structures, unspecified: Secondary | ICD-10-CM | POA: Insufficient documentation

## 2012-07-30 DIAGNOSIS — K047 Periapical abscess without sinus: Secondary | ICD-10-CM | POA: Insufficient documentation

## 2012-07-30 DIAGNOSIS — Z87891 Personal history of nicotine dependence: Secondary | ICD-10-CM | POA: Insufficient documentation

## 2012-07-30 DIAGNOSIS — K0889 Other specified disorders of teeth and supporting structures: Secondary | ICD-10-CM

## 2012-07-30 LAB — CBC
HCT: 40.9 % (ref 36.0–46.0)
MCHC: 33.3 g/dL (ref 30.0–36.0)
RDW: 13.7 % (ref 11.5–15.5)

## 2012-07-30 LAB — URINALYSIS, ROUTINE W REFLEX MICROSCOPIC
Glucose, UA: NEGATIVE mg/dL
Ketones, ur: 40 mg/dL — AB
Nitrite: NEGATIVE
Protein, ur: 30 mg/dL — AB
Urobilinogen, UA: 0.2 mg/dL (ref 0.0–1.0)

## 2012-07-30 LAB — BASIC METABOLIC PANEL
BUN: 10 mg/dL (ref 6–23)
GFR calc Af Amer: >90 mL/min (ref >90)
GFR calc non Af Amer: >90 mL/min (ref >90)
Potassium: 3.9 mEq/L (ref 3.5–5.1)

## 2012-07-30 LAB — URINE MICROSCOPIC-ADD ON

## 2012-07-30 MED ORDER — HYDROCODONE-ACETAMINOPHEN 5-325 MG PO TABS
2.0000 | ORAL_TABLET | Freq: Once | ORAL | Status: AC
  Administered 2012-07-30: 2 via ORAL
  Filled 2012-07-30: qty 2

## 2012-07-30 MED ORDER — PENICILLIN V POTASSIUM 250 MG PO TABS
500.0000 mg | ORAL_TABLET | Freq: Once | ORAL | Status: AC
  Administered 2012-07-30: 500 mg via ORAL
  Filled 2012-07-30: qty 2

## 2012-07-30 MED ORDER — PENICILLIN V POTASSIUM 500 MG PO TABS: 500.0000 mg | ORAL_TABLET | Freq: Four times a day (QID) | ORAL | Status: DC

## 2012-07-30 MED ORDER — HYDROCODONE-ACETAMINOPHEN 5-325 MG PO TABS: ORAL_TABLET | ORAL | Status: DC

## 2012-07-30 NOTE — ED Notes (Signed)
Pt presents to ED with c/o abdominal pain and nausea and tooth pain. Pt states she feels tooth is draining and making her nauseous.

## 2012-07-30 NOTE — ED Provider Notes (Signed)
History     CSN: [id number]  Arrival date & time 07/30/12  1538   First MD Initiated Contact with Patient 07/30/12 1849      Chief Complaint  Patient presents with  . Abdominal Pain    (Consider location/radiation/quality/duration/timing/severity/associated sxs/prior treatment) HPI  Patient reports she's had pain in her left lower tooth for the past week. She states it's painful to chew. She also states things taste bad. She denies fever, vomiting, diarrhea, cough, or abdominal pain. She does state she has nausea. She states she did see a dentist about a year ago however she did not like that dentist.  PCP none  History reviewed. No pertinent past medical history.  Past Surgical History  Procedure Date  . Tympanostomy tube placement     History reviewed. No pertinent family history.  History  Substance Use Topics  . Smoking status: Former Smoker quit one year ago   . Smokeless tobacco: Not on file  . Alcohol Use: No   employed  OB History    Grav Para Term Preterm Abortions TAB SAB Ect Mult Living                  Review of Systems  All other systems reviewed and are negative.    Allergies  Coconut flavor and Latex  Home Medications   Current Outpatient Rx  Name  Route  Sig  Dispense  Refill  . NAPROXEN 500 MG PO TABS   Oral   Take 500 mg by mouth once.           BP 145/77  Pulse 77  Temp 99.2 F (37.3 C) (Oral)  Resp 16  SpO2 100%  Vital signs normal    Physical Exam  Nursing note and vitals reviewed. Constitutional: She is oriented to person, place, and time. She appears well-developed and well-nourished.  Non-toxic appearance. She does not appear ill. No distress.  HENT:  Head: Normocephalic and atraumatic.  Right Ear: External ear normal.  Left Ear: External ear normal.  Nose: Nose normal. No mucosal edema or rhinorrhea.  Mouth/Throat: Oropharynx is clear and moist and mucous membranes are normal. No dental abscesses or uvula  swelling.       The patient indicates she has pain in #14 which is rotted to the gumline with some mild swelling of the surrounding the tooth. However the pain she presented to the emergency department his #18 without obvious cavity. There is mild swelling of the gum surrounding the tooth. She is missing several other teeth. There is tarter.  Eyes: Conjunctivae normal and EOM are normal. Pupils are equal, round, and reactive to light.  Neck: Normal range of motion and full passive range of motion without pain. Neck supple.       There is no cervical lymphadenopathy or swelling underneath the mandible.  Pulmonary/Chest: Effort normal and breath sounds normal. No respiratory distress. She has no rhonchi. She exhibits no crepitus.  Abdominal: Normal appearance.  Musculoskeletal: Normal range of motion. She exhibits no edema and no tenderness.       Moves all extremities well.   Lymphadenopathy:    She has no cervical adenopathy.  Neurological: She is alert and oriented to person, place, and time. She has normal strength. No cranial nerve deficit.  Skin: Skin is warm, dry and intact. No rash noted. No erythema. No pallor.  Psychiatric: She has a normal mood and affect. Her speech is normal and behavior is normal. Her mood appears not anxious.  ED Course  Procedures (including critical care time)   Medications  penicillin v potassium (VEETID) tablet 500 mg (500 mg Oral Given 07/30/12 1934)  HYDROcodone-acetaminophen (NORCO/VICODIN) 5-325 MG per tablet 2 tablet (2 tablet Oral Given 07/30/12 1934)   Patient verified that she does not have any abdominal pain. She has nausea.  Results for orders placed during the hospital encounter of 07/30/12  URINALYSIS, ROUTINE W REFLEX MICROSCOPIC      Component Value Range   Color, Urine YELLOW  YELLOW   APPearance HAZY (*) CLEAR   Specific Gravity, Urine 1.038 (*) 1.005 - 1.030   pH 5.5  5.0 - 8.0   Glucose, UA NEGATIVE  NEGATIVE mg/dL   Hgb urine  dipstick NEGATIVE  NEGATIVE   Bilirubin Urine SMALL (*) NEGATIVE   Ketones, ur 40 (*) NEGATIVE mg/dL   Protein, ur 30 (*) NEGATIVE mg/dL   Urobilinogen, UA 0.2  0.0 - 1.0 mg/dL   Nitrite NEGATIVE  NEGATIVE   Leukocytes, UA TRACE (*) NEGATIVE  CBC      Component Value Range   WBC 9.5  4.0 - 10.5 K/uL   RBC 4.81  3.87 - 5.11 MIL/uL   Hemoglobin 13.6  12.0 - 15.0 g/dL   HCT 16.1  09.6 - 04.5 %   MCV 85.0  78.0 - 100.0 fL   MCH 28.3  26.0 - 34.0 pg   MCHC 33.3  30.0 - 36.0 g/dL   RDW 40.9  81.1 - 91.4 %   Platelets 239  150 - 400 K/uL  BASIC METABOLIC PANEL      Component Value Range   Sodium 138  135 - 145 mEq/L   Potassium 3.9  3.5 - 5.1 mEq/L   Chloride 100  96 - 112 mEq/L   CO2 25  19 - 32 mEq/L   Glucose, Bld 79  70 - 99 mg/dL   BUN 10  6 - 23 mg/dL   Creatinine, Ser 7.82  0.50 - 1.10 mg/dL   Calcium 9.4  8.4 - 95.6 mg/dL   GFR calc non Af Amer >90  >90 mL/min   GFR calc Af Amer >90  >90 mL/min  POCT PREGNANCY, URINE      Component Value Range   Preg Test, Ur NEGATIVE  NEGATIVE  URINE MICROSCOPIC-ADD ON      Component Value Range   Squamous Epithelial / LPF FEW (*) RARE   WBC, UA 3-6  <3 WBC/hpf   Bacteria, UA FEW (*) RARE   Urine-Other MUCOUS PRESENT     Laboratory interpretation all normal except        1. Toothache   2. Abscessed tooth     New Prescriptions   HYDROCODONE-ACETAMINOPHEN (NORCO/VICODIN) 5-325 MG PER TABLET    Take 1 or 2 po Q 6hrs for pain MUST FILL WITH PEN VK   PENICILLIN V POTASSIUM (VEETID) 500 MG TABLET    Take 1 tablet (500 mg total) by mouth 4 (four) times daily.    Plan discharge  Devoria Albe, MD, FACEP   MDM          Ward Givens, MD 07/30/12 2017

## 2012-08-01 LAB — URINE CULTURE

## 2012-09-14 ENCOUNTER — Encounter (HOSPITAL_COMMUNITY): Payer: Self-pay | Admitting: Emergency Medicine

## 2012-09-14 ENCOUNTER — Emergency Department (HOSPITAL_COMMUNITY)
Admission: EM | Admit: 2012-09-14 | Discharge: 2012-09-14 | Disposition: A | Discharge status: Home / Self Care | Payer: Self-pay | Attending: Emergency Medicine | Admitting: Emergency Medicine

## 2012-09-14 DIAGNOSIS — Z87891 Personal history of nicotine dependence: Secondary | ICD-10-CM | POA: Insufficient documentation

## 2012-09-14 DIAGNOSIS — K2960 Other gastritis without bleeding: Secondary | ICD-10-CM | POA: Insufficient documentation

## 2012-09-14 LAB — URINALYSIS, MICROSCOPIC ONLY
Bilirubin Urine: NEGATIVE
Hgb urine dipstick: NEGATIVE
Nitrite: NEGATIVE
Protein, ur: NEGATIVE mg/dL
Specific Gravity, Urine: 1.029 (ref 1.005–1.030)
Urobilinogen, UA: 0.2 mg/dL (ref 0.0–1.0)

## 2012-09-14 LAB — CBC WITH DIFFERENTIAL/PLATELET
Basophils Absolute: 0 10*3/uL (ref 0.0–0.1)
Basophils Relative: 0 % (ref 0–1)
Eosinophils Absolute: 0.3 10*3/uL (ref 0.0–0.7)
Eosinophils Relative: 3 % (ref 0–5)
HCT: 40.1 % (ref 36.0–46.0)
Hemoglobin: 13.3 g/dL (ref 12.0–15.0)
Lymphocytes Relative: 50 % — ABNORMAL HIGH (ref 12–46)
Lymphs Abs: 4.7 10*3/uL — ABNORMAL HIGH (ref 0.7–4.0)
MCH: 28.2 pg (ref 26.0–34.0)
MCHC: 33.2 g/dL (ref 30.0–36.0)
MCV: 85 fL (ref 78.0–100.0)
Monocytes Absolute: 0.7 10*3/uL (ref 0.1–1.0)
Monocytes Relative: 7 % (ref 3–12)
Neutro Abs: 3.8 10*3/uL (ref 1.7–7.7)
Neutrophils Relative %: 41 % — ABNORMAL LOW (ref 43–77)
Platelets: 239 10*3/uL (ref 150–400)
RBC: 4.72 MIL/uL (ref 3.87–5.11)
RDW: 13.9 % (ref 11.5–15.5)
WBC: 9.4 10*3/uL (ref 4.0–10.5)

## 2012-09-14 LAB — COMPREHENSIVE METABOLIC PANEL
ALT: 18 U/L (ref 0–35)
AST: 19 U/L (ref 0–37)
Albumin: 3.8 g/dL (ref 3.5–5.2)
Alkaline Phosphatase: 85 U/L (ref 39–117)
BUN: 11 mg/dL (ref 6–23)
CO2: 28 mEq/L (ref 19–32)
Calcium: 9.7 mg/dL (ref 8.4–10.5)
Chloride: 99 mEq/L (ref 96–112)
Creatinine, Ser: 1.01 mg/dL (ref 0.50–1.10)
GFR calc Af Amer: 86 mL/min — ABNORMAL LOW (ref >90)
GFR calc non Af Amer: 74 mL/min — ABNORMAL LOW (ref >90)
Glucose, Bld: 97 mg/dL (ref 70–99)
Potassium: 4.2 mEq/L (ref 3.5–5.1)
Sodium: 137 mEq/L (ref 135–145)
Total Bilirubin: 0.2 mg/dL — ABNORMAL LOW (ref 0.3–1.2)
Total Protein: 8.2 g/dL (ref 6.0–8.3)

## 2012-09-14 LAB — URINE CULTURE
Colony Count: NO GROWTH
Culture: NO GROWTH

## 2012-09-14 LAB — LIPASE, BLOOD: Lipase: 34 U/L (ref 11–59)

## 2012-09-14 LAB — POCT I-STAT TROPONIN I: Troponin i, poc: 0 ng/mL (ref 0.00–0.08)

## 2012-09-14 LAB — POCT PREGNANCY, URINE: Preg Test, Ur: NEGATIVE

## 2012-09-14 MED ORDER — PANTOPRAZOLE SODIUM 20 MG PO TBEC: 40.0000 mg | DELAYED_RELEASE_TABLET | Freq: Every day | ORAL | Status: DC

## 2012-09-14 MED ORDER — FAMOTIDINE 20 MG PO TABS: 20.0000 mg | ORAL_TABLET | Freq: Two times a day (BID) | ORAL | Status: DC

## 2012-09-14 MED ORDER — PANTOPRAZOLE SODIUM 40 MG PO TBEC
40.0000 mg | DELAYED_RELEASE_TABLET | Freq: Once | ORAL | Status: AC
  Administered 2012-09-14: 40 mg via ORAL
  Filled 2012-09-14: qty 1

## 2012-09-14 MED ORDER — FAMOTIDINE 20 MG PO TABS
20.0000 mg | ORAL_TABLET | Freq: Once | ORAL | Status: AC
  Administered 2012-09-14: 20 mg via ORAL
  Filled 2012-09-14: qty 1

## 2012-09-14 NOTE — ED Provider Notes (Addendum)
History     CSN: [id number]  Arrival date & time 09/14/12  0158   First MD Initiated Contact with Patient 09/14/12 [phone]      Chief Complaint  Patient presents with  . Abdominal Pain    (Consider location/radiation/quality/duration/timing/severity/associated sxs/prior treatment) The history is provided by the patient.  Megan Valenzuela is a 32 y.o. female otherwise healthy here presenting with abdominal pain. Epigastric pain for the last 2 months. She experiences pain at night before she sleeps and sometimes wakes her up from sleep. Pain does not radiate and denies any nausea vomiting or diarrhea. Pain not worse with food. No urinary symptoms or fevers. She said tonight it was bad and she walked around now she is pain-free.   History reviewed. No pertinent past medical history.  Past Surgical History  Procedure Laterality Date  . Tympanostomy tube placement      No family history on file.  History  Substance Use Topics  . Smoking status: Former Games developer  . Smokeless tobacco: Not on file  . Alcohol Use: No    OB History   Grav Para Term Preterm Abortions TAB SAB Ect Mult Living                  Review of Systems  Gastrointestinal: Positive for abdominal pain.  All other systems reviewed and are negative.    Allergies  Coconut flavor and Latex  Home Medications   Current Outpatient Rx  Name  Route  Sig  Dispense  Refill  . OVER THE COUNTER MEDICATION   Oral   Take by mouth 2 (two) times daily. HCG Weightloss supplement           BP 165/102  Temp(Src) 97.7 F (36.5 C) (Oral)  Resp 20  SpO2 100%  LMP 08/27/2012  Physical Exam  Nursing note and vitals reviewed. Constitutional: She is oriented to person, place, and time. She appears well-developed and well-nourished.  HENT:  Head: Normocephalic.  Mouth/Throat: Oropharynx is clear and moist.    Poor dentition overall. L upper molar there is a broken tooth (chronic), no surrounding erythema or fluctuance    Eyes: Conjunctivae are normal. Pupils are equal, round, and reactive to light.  Neck: Normal range of motion. Neck supple.  Cardiovascular: Normal rate, regular rhythm and normal heart sounds.   Pulmonary/Chest: Effort normal and breath sounds normal. No respiratory distress. She has no wheezes. She has no rales.  Abdominal: Soft. Bowel sounds are normal.  Obese, mild epigastric tenderness, no rebound. No RUQ tenderness.   Musculoskeletal: Normal range of motion.  Neurological: She is alert and oriented to person, place, and time.  Skin: Skin is warm and dry.  Psychiatric: She has a normal mood and affect. Her behavior is normal. Judgment and thought content normal.    ED Course  Procedures (including critical care time)  Labs Reviewed  CBC WITH DIFFERENTIAL - Abnormal; Notable for the following:    Neutrophils Relative 41 (*)    Lymphocytes Relative 50 (*)    Lymphs Abs 4.7 (*)    All other components within normal limits  COMPREHENSIVE METABOLIC PANEL - Abnormal; Notable for the following:    Total Bilirubin 0.2 (*)    GFR calc non Af Amer 74 (*)    GFR calc Af Amer 86 (*)    All other components within normal limits  URINALYSIS, MICROSCOPIC ONLY - Abnormal; Notable for the following:    APPearance CLOUDY (*)    Bacteria, UA MANY (*)  Squamous Epithelial / LPF MANY (*)    All other components within normal limits  URINE CULTURE  LIPASE, BLOOD  POCT PREGNANCY, URINE  POCT I-STAT TROPONIN I   No results found.   No diagnosis found.    MDM  Megan Valenzuela is a 32 y.o. female here with epigastric pain at night. Her symptoms are consistent with reflux. I recommend protonix, pepcid. Not concerned for cholecystitis. UA contaminated and she has no symptoms. She doesn't have a doctor so I gave her resource guide and told her to see a GI doctor.           Richardean Canal, MD 09/14/12 0410  Richardean Canal, MD 09/14/12 [phone]

## 2012-09-14 NOTE — ED Notes (Signed)
C/o upper abd pain x 20 min.  Denies nausea, vomiting, diarrhea, or urinary complaints.  States she has had the same pain intermittently over the past 2 months.

## 2012-09-23 ENCOUNTER — Encounter (HOSPITAL_COMMUNITY): Payer: Self-pay

## 2012-09-23 ENCOUNTER — Emergency Department (HOSPITAL_COMMUNITY)
Admission: EM | Admit: 2012-09-23 | Discharge: 2012-09-23 | Disposition: A | Discharge status: Home / Self Care | Payer: Self-pay | Attending: Emergency Medicine | Admitting: Emergency Medicine

## 2012-09-23 DIAGNOSIS — Z87891 Personal history of nicotine dependence: Secondary | ICD-10-CM | POA: Insufficient documentation

## 2012-09-23 DIAGNOSIS — R112 Nausea with vomiting, unspecified: Secondary | ICD-10-CM | POA: Insufficient documentation

## 2012-09-23 DIAGNOSIS — Z79899 Other long term (current) drug therapy: Secondary | ICD-10-CM | POA: Insufficient documentation

## 2012-09-23 LAB — COMPREHENSIVE METABOLIC PANEL
BUN: 10 mg/dL (ref 6–23)
CO2: 24 mEq/L (ref 19–32)
Calcium: 9.5 mg/dL (ref 8.4–10.5)
Chloride: 104 mEq/L (ref 96–112)
Creatinine, Ser: 0.82 mg/dL (ref 0.50–1.10)
GFR calc Af Amer: >90 mL/min (ref >90)
GFR calc non Af Amer: >90 mL/min (ref >90)
Glucose, Bld: 100 mg/dL — ABNORMAL HIGH (ref 70–99)
Total Bilirubin: 0.1 mg/dL — ABNORMAL LOW (ref 0.3–1.2)

## 2012-09-23 LAB — CBC WITH DIFFERENTIAL/PLATELET
Eosinophils Absolute: 0.3 10*3/uL (ref 0.0–0.7)
Hemoglobin: 13.1 g/dL (ref 12.0–15.0)
Lymphocytes Relative: 34 % (ref 12–46)
Lymphs Abs: 3 10*3/uL (ref 0.7–4.0)
MCH: 28.2 pg (ref 26.0–34.0)
Monocytes Relative: 8 % (ref 3–12)
Neutro Abs: 4.9 10*3/uL (ref 1.7–7.7)
Neutrophils Relative %: 55 % (ref 43–77)
Platelets: 210 10*3/uL (ref 150–400)
RBC: 4.64 MIL/uL (ref 3.87–5.11)
WBC: 8.8 10*3/uL (ref 4.0–10.5)

## 2012-09-23 LAB — LIPASE, BLOOD: Lipase: 43 U/L (ref 11–59)

## 2012-09-23 MED ORDER — ONDANSETRON HCL 8 MG PO TABS: 8.0000 mg | ORAL_TABLET | Freq: Three times a day (TID) | ORAL | Status: DC | PRN

## 2012-09-23 MED ORDER — ONDANSETRON HCL 4 MG/2ML IJ SOLN
4.0000 mg | Freq: Once | INTRAMUSCULAR | Status: AC
  Administered 2012-09-23: 4 mg via INTRAVENOUS
  Filled 2012-09-23: qty 2

## 2012-09-23 MED ORDER — SODIUM CHLORIDE 0.9 % IV BOLUS (SEPSIS)
500.0000 mL | Freq: Once | INTRAVENOUS | Status: AC
  Administered 2012-09-23: 500 mL via INTRAVENOUS

## 2012-09-23 MED ORDER — PANTOPRAZOLE SODIUM 40 MG IV SOLR: 40.0000 mg | Freq: Once | INTRAVENOUS | Status: DC

## 2012-09-23 MED ORDER — PANTOPRAZOLE SODIUM 40 MG PO TBEC
40.0000 mg | DELAYED_RELEASE_TABLET | Freq: Once | ORAL | Status: AC
  Administered 2012-09-23: 40 mg via ORAL
  Filled 2012-09-23: qty 1

## 2012-09-23 MED ORDER — FENTANYL CITRATE 0.05 MG/ML IJ SOLN
100.0000 ug | Freq: Once | INTRAMUSCULAR | Status: AC
  Administered 2012-09-23: 100 ug via INTRAVENOUS
  Filled 2012-09-23: qty 2

## 2012-09-23 NOTE — ED Provider Notes (Signed)
History     CSN: [id number]  Arrival date & time 09/23/12  2956   First MD Initiated Contact with Patient 09/23/12 0305      Chief Complaint  Patient presents with  . Abdominal Pain    (Consider location/radiation/quality/duration/timing/severity/associated sxs/prior treatment) HPI This is a 32 year old female who will about 1 AM with upper abdominal pain. She describes the pain as cramping and "I could hear it gurgling". The pain was moderate to severe earlier but is improving. It is associated with nausea and vomiting. She has vomited twice. The nausea persists. She has had no diarrhea. The pain is not worse with palpation or movement. She took Alka-Seltzer, Vicodin, "a stomach pill" and ginger ale without relief. She denies fever or chills.  History reviewed. No pertinent past medical history.  Past Surgical History  Procedure Laterality Date  . Tympanostomy tube placement      No family history on file.  History  Substance Use Topics  . Smoking status: Former Games developer  . Smokeless tobacco: Never Used  . Alcohol Use: No    OB History   Grav Para Term Preterm Abortions TAB SAB Ect Mult Living                  Review of Systems  All other systems reviewed and are negative.    Allergies  Coconut flavor and Latex  Home Medications   Current Outpatient Rx  Name  Route  Sig  Dispense  Refill  . OVER THE COUNTER MEDICATION   Oral   Take by mouth 2 (two) times daily. HCG Weightloss supplement         . famotidine (PEPCID) 20 MG tablet   Oral   Take 1 tablet (20 mg total) by mouth 2 (two) times daily.   20 tablet   0   . pantoprazole (PROTONIX) 20 MG tablet   Oral   Take 2 tablets (40 mg total) by mouth daily.   30 tablet   0     BP 149/113  Pulse 87  Temp(Src) 98 F (36.7 C) (Oral)  Resp 20  SpO2 100%  LMP 08/27/2012  Physical Exam General: Well-developed, obese female in no acute distress; appearance consistent with age of record HENT:  normocephalic, atraumatic Eyes: pupils equal round and reactive to light; extraocular muscles intact Neck: supple Heart: regular rate and rhythm Lungs: clear to auscultation bilaterally Abdomen: soft; nondistended; nontender; no masses or hepatosplenomegaly; bowel sounds present Extremities: No deformity; full range of motion Neurologic: Awake, alert and oriented; motor function intact in all extremities and symmetric; no facial droop Skin: Warm and dry Psychiatric: Flat affect    ED Course  Procedures (including critical care time)     MDM   Nursing notes and vitals signs, including pulse oximetry, reviewed.  Summary of this visit's results, reviewed by myself:  Labs:  Results for orders placed during the hospital encounter of 09/23/12 (from the past 24 hour(s))  COMPREHENSIVE METABOLIC PANEL     Status: Abnormal   Collection Time    09/23/12  3:20 AM      Result Value Range   Sodium 140  135 - 145 mEq/L   Potassium 4.0  3.5 - 5.1 mEq/L   Chloride 104  96 - 112 mEq/L   CO2 24  19 - 32 mEq/L   Glucose, Bld 100 (*) 70 - 99 mg/dL   BUN 10  6 - 23 mg/dL   Creatinine, Ser 2.13  0.50 - 1.10  mg/dL   Calcium 9.5  8.4 - 45.4 mg/dL   Total Protein 7.8  6.0 - 8.3 g/dL   Albumin 3.7  3.5 - 5.2 g/dL   AST 17  0 - 37 U/L   ALT 15  0 - 35 U/L   Alkaline Phosphatase 81  39 - 117 U/L   Total Bilirubin 0.1 (*) 0.3 - 1.2 mg/dL   GFR calc non Af Amer >90  >90 mL/min   GFR calc Af Amer >90  >90 mL/min  CBC WITH DIFFERENTIAL     Status: None   Collection Time    09/23/12  3:20 AM      Result Value Range   WBC 8.8  4.0 - 10.5 K/uL   RBC 4.64  3.87 - 5.11 MIL/uL   Hemoglobin 13.1  12.0 - 15.0 g/dL   HCT 09.8  11.9 - 14.7 %   MCV 84.5  78.0 - 100.0 fL   MCH 28.2  26.0 - 34.0 pg   MCHC 33.4  30.0 - 36.0 g/dL   RDW 82.9  56.2 - 13.0 %   Platelets 210  150 - 400 K/uL   Neutrophils Relative 55  43 - 77 %   Neutro Abs 4.9  1.7 - 7.7 K/uL   Lymphocytes Relative 34  12 - 46 %   Lymphs  Abs 3.0  0.7 - 4.0 K/uL   Monocytes Relative 8  3 - 12 %   Monocytes Absolute 0.7  0.1 - 1.0 K/uL   Eosinophils Relative 3  0 - 5 %   Eosinophils Absolute 0.3  0.0 - 0.7 K/uL   Basophils Relative 0  0 - 1 %   Basophils Absolute 0.0  0.0 - 0.1 K/uL  LIPASE, BLOOD     Status: None   Collection Time    09/23/12  3:20 AM      Result Value Range   Lipase 43  11 - 59 U/L    4:35 AM Feels better after IV medications and fluids. Symptoms consistent with a gastrointestinal virus although gastritis exacerbation is a possibility. No tenderness to suggest gallbladder disease.       Hanley Seamen, MD 09/23/12 [phone]

## 2012-09-23 NOTE — ED Notes (Signed)
Patient presents with an acute onset of upper abdominal pain, nausea and vomiting since around 1 am, which woke her out of sleep. Last meal around 3 pm yesterday. Ate hamburgers and french fries prepared by her mother at home. Patient states "I think I got food poisoning." To her knowledge, no one else who ate this is sick. Patient tried Catering manager, "a stomach pill", Vicodin and ginger ale with no relief. Denies fevers, chills, sweats, constipation or diarrhea.

## 2012-11-11 ENCOUNTER — Emergency Department (HOSPITAL_COMMUNITY)
Admission: EM | Admit: 2012-11-11 | Discharge: 2012-11-11 | Disposition: A | Discharge status: Home / Self Care | Payer: Medicaid Other | Attending: Emergency Medicine | Admitting: Emergency Medicine

## 2012-11-11 ENCOUNTER — Encounter (HOSPITAL_COMMUNITY): Payer: Self-pay | Admitting: Emergency Medicine

## 2012-11-11 DIAGNOSIS — Z91040 Latex allergy status: Secondary | ICD-10-CM | POA: Insufficient documentation

## 2012-11-11 DIAGNOSIS — K0889 Other specified disorders of teeth and supporting structures: Secondary | ICD-10-CM

## 2012-11-11 DIAGNOSIS — K047 Periapical abscess without sinus: Secondary | ICD-10-CM

## 2012-11-11 DIAGNOSIS — K029 Dental caries, unspecified: Secondary | ICD-10-CM | POA: Insufficient documentation

## 2012-11-11 DIAGNOSIS — K089 Disorder of teeth and supporting structures, unspecified: Secondary | ICD-10-CM | POA: Insufficient documentation

## 2012-11-11 DIAGNOSIS — Z87891 Personal history of nicotine dependence: Secondary | ICD-10-CM | POA: Insufficient documentation

## 2012-11-11 MED ORDER — HYDROCODONE-ACETAMINOPHEN 5-325 MG PO TABS: 1.0000 | ORAL_TABLET | ORAL | Status: DC | PRN

## 2012-11-11 MED ORDER — HYDROCODONE-ACETAMINOPHEN 5-325 MG PO TABS
2.0000 | ORAL_TABLET | Freq: Once | ORAL | Status: AC
  Administered 2012-11-11: 2 via ORAL
  Filled 2012-11-11: qty 2

## 2012-11-11 MED ORDER — PENICILLIN V POTASSIUM 500 MG PO TABS: 500.0000 mg | ORAL_TABLET | Freq: Four times a day (QID) | ORAL | Status: DC

## 2012-11-11 NOTE — Discharge Instructions (Signed)
You have a dental injury. Use the resource guide listed below to help you find a dentist if you do not already have one to followup with. It is very important that you get evaluated by a dentist as soon as possible. Call tomorrow to schedule an appointment. Use your pain medication as prescribed and do not operate heavy machinery while on pain medication. Note that your pain medication contains acetaminophen (Tylenol) & its is not reccommended that you use additional acetaminophen (Tylenol) while taking this medication. Take your full course of antibiotics. Read the instructions below.  Eat a soft or liquid diet and rinse your mouth out after meals with warm water. You should see a dentist or return here at once if you have increased swelling, increased pain or uncontrolled bleeding from the site of your injury.   SEEK MEDICAL CARE IF:   You have increased pain not controlled with medicines.   You have swelling around your tooth, in your face or neck.   You have bleeding which starts, continues, or gets worse.   You have a fever >101  If you are unable to open your mouth  RESOURCE GUIDE  Dental Problems  Patients with Medicaid: [facility]                     [facility] [address]                                           [address] Phone:  [phone]                                                  Phone:  [phone]  If unable to pay or uninsured, contact:  [facility] or [facility] to become qualified for the adult dental clinic.  Chronic Pain Problems Contact [facility]  [phone] Patients need to be referred by their primary care doctor.  Insufficient Money for Medicine Contact [employer]:  call "211" or [facility] 271-5999.  No Primary Care Doctor Call [facility]  832-8000 Other agencies that provide inexpensive medical care    [facility]  [phone]    [facility]  [phone]    [facility]  [phone]    [facility]  832-4777    [facility]  [phone]    [facility]  [phone]  Psychological Services [facility]  [phone] [facility]  [phone] [facility]   800 853-5163 (emergency services 641-4993)  Substance Abuse Resources [facility]  336-882-2125 [facility] 336-784-9470 [facility] 336-285-9073 [facility] 336-845-3988 Residential & Outpatient Substance Abuse Program  800-659-3381  Abuse/Neglect [place] Child Abuse Hotline (336) 641-3795 [place] Child Abuse Hotline 800-378-5315 (After Hours)  Emergency Shelter [facility] (336) 271-5985  [facility] (336) 275-9566 [facility] (704) 372-4663  MRSA Hotline #:   832-7006    [place] [facility]  [facility] [place]     [employer]                            [facility] [address] [place]                       [address]      [address]  [place]                                                [place]                            [place] Phone:  [phone]                                   Phone:  [phone]                 Phone:  [phone]  [facility] Phone:  [phone]  [place] Child Abuse Hotline (336) 342-1394 (336) 342-3537 (After Hours)    

## 2012-11-11 NOTE — ED Provider Notes (Signed)
History     CSN: [id number]  Arrival date & time 11/11/12  2246   First MD Initiated Contact with Patient 11/11/12 2258      Chief Complaint  Patient presents with  . Dental Pain    (Consider location/radiation/quality/duration/timing/severity/associated sxs/prior treatment) Patient is a 32 y.o. female presenting with tooth pain. The history is provided by the patient and medical records. No language interpreter was used.  Dental PainPrimary symptoms do not include headaches, fever or cough.  Additional symptoms do not include: facial swelling, trouble swallowing, drooling, ear pain and nosebleeds.    Nicha Hemann is a 32 y.o. female  with no medical hx presents to the Emergency Department complaining of gradual, persistent, progressively worsening dental pain onset tonight after she was digging at her tooth with her finger nail because she was bored.  Associated symptoms include pain at the site, sensation of swelling around the tooth.  Nothing makes it better and continuing to pick at it makes it worse.  Pt denies fever, chills, headache, neck pain, chest pain, SOB, abd pain, N/V/D.  Pt rates the pain at a 6/10.    History reviewed. No pertinent past medical history.  Past Surgical History  Procedure Laterality Date  . Tympanostomy tube placement      No family history on file.  History  Substance Use Topics  . Smoking status: Former Games developer  . Smokeless tobacco: Never Used  . Alcohol Use: No    OB History   Grav Para Term Preterm Abortions TAB SAB Ect Mult Living                  Review of Systems  Constitutional: Negative for fever, chills and appetite change.  HENT: Positive for dental problem. Negative for ear pain, nosebleeds, facial swelling, rhinorrhea, drooling, trouble swallowing, neck pain, neck stiffness and postnasal drip.   Eyes: Negative for pain and redness.  Respiratory: Negative for cough and wheezing.   Cardiovascular: Negative for chest pain.   Gastrointestinal: Negative for nausea, vomiting and abdominal pain.  Skin: Negative for color change and rash.  Neurological: Negative for weakness, light-headedness and headaches.  All other systems reviewed and are negative.    Allergies  Coconut flavor and Latex  Home Medications   Current Outpatient Rx  Name  Route  Sig  Dispense  Refill  . HYDROcodone-acetaminophen (NORCO/VICODIN) 5-325 MG per tablet   Oral   Take 1 tablet by mouth every 4 (four) hours as needed for pain.   15 tablet   0   . penicillin v potassium (VEETID) 500 MG tablet   Oral   Take 1 tablet (500 mg total) by mouth 4 (four) times daily.   40 tablet   0     BP 127/82  Pulse 80  Temp(Src) 98.3 F (36.8 C) (Oral)  Resp 18  SpO2 97%  LMP 10/14/2012  Physical Exam  Nursing note and vitals reviewed. Constitutional: She is oriented to person, place, and time. She appears well-developed and well-nourished.  HENT:  Head: Normocephalic.  Right Ear: Tympanic membrane, external ear and ear canal normal.  Left Ear: Tympanic membrane, external ear and ear canal normal.  Nose: Nose normal. No mucosal edema or rhinorrhea. Right sinus exhibits no maxillary sinus tenderness and no frontal sinus tenderness. Left sinus exhibits no maxillary sinus tenderness and no frontal sinus tenderness.  Mouth/Throat: Uvula is midline, oropharynx is clear and moist and mucous membranes are normal. No oral lesions. Abnormal dentition. Dental abscesses and  dental caries present. No edematous or lacerations. No oropharyngeal exudate, posterior oropharyngeal edema, posterior oropharyngeal erythema or tonsillar abscesses.  Pt with small area or erythema and fluctuance in the right upper gumline consistent with an abscess  Eyes: Conjunctivae are normal. Pupils are equal, round, and reactive to light. Right eye exhibits no discharge. Left eye exhibits no discharge.  Neck: Normal range of motion and full passive range of motion without  pain. Neck supple. No rigidity. Normal range of motion present.  Cardiovascular: Normal rate, regular rhythm, S1 normal, S2 normal and normal heart sounds.   Pulses:      Radial pulses are 2+ on the right side, and 2+ on the left side.       Dorsalis pedis pulses are 2+ on the right side, and 2+ on the left side.       Posterior tibial pulses are 2+ on the right side, and 2+ on the left side.  Pulmonary/Chest: Effort normal and breath sounds normal. No respiratory distress. She has no wheezes.  Musculoskeletal: Normal range of motion.  Lymphadenopathy:    She has no cervical adenopathy.  Neurological: She is alert and oriented to person, place, and time. She exhibits normal muscle tone. Coordination normal.  Skin: Skin is warm and dry. No rash noted. No erythema.  Psychiatric: She has a normal mood and affect.    ED Course  INCISION AND DRAINAGE Date/Time: 11/11/2012 11:08 PM Performed by: Dierdre Forth Authorized by: Dierdre Forth Consent: Verbal consent obtained. Risks and benefits: risks, benefits and alternatives were discussed Consent given by: patient Patient understanding: patient states understanding of the procedure being performed Patient consent: the patient's understanding of the procedure matches consent given Procedure consent: procedure consent matches procedure scheduled Relevant documents: relevant documents present and verified Site marked: the operative site was marked Required items: required blood products, implants, devices, and special equipment available Patient identity confirmed: verbally with patient and arm band Time out: Immediately prior to procedure a "time out" was called to verify the correct patient, procedure, equipment, support staff and site/side marked as required. Type: abscess Body area: mouth (dental abscess tooth # 5) Patient sedated: no Needle gauge: 18 Complexity: simple Drainage: purulent Drainage amount: moderate Wound  treatment: wound left open Patient tolerance: Patient tolerated the procedure well with no immediate complications.   (including critical care time)  Labs Reviewed - No data to display No results found.   1. Dental abscess   2. Pain, dental       MDM  Sherald Barge presents with dental pain and abscess on exam.  Patient with toothache. Small gross abscess on exam.  I&D without complication of tooth #5 and expression of puss; pt with some relief afterwards.  Exam unconcerning for Ludwig's angina or spread of infection.  Will treat with penicillin and pain medicine.  Urged patient to follow-up with dentist.           Dierdre Forth, PA-C 11/11/12 [id number]

## 2012-11-11 NOTE — ED Notes (Signed)
C/o toothache that started tonight.

## 2012-11-11 NOTE — ED Provider Notes (Signed)
Medical screening examination/treatment/procedure(s) were performed by non-physician practitioner and as supervising physician I was immediately available for consultation/collaboration.    Vida Roller, MD 11/11/12 [phone]

## 2012-11-12 ENCOUNTER — Encounter (HOSPITAL_COMMUNITY): Payer: Self-pay | Admitting: Emergency Medicine

## 2012-11-12 ENCOUNTER — Emergency Department (HOSPITAL_COMMUNITY)
Admission: EM | Admit: 2012-11-12 | Discharge: 2012-11-13 | Disposition: A | Discharge status: Home / Self Care | Payer: Medicaid Other | Attending: Emergency Medicine | Admitting: Emergency Medicine

## 2012-11-12 DIAGNOSIS — Z91040 Latex allergy status: Secondary | ICD-10-CM | POA: Insufficient documentation

## 2012-11-12 DIAGNOSIS — K047 Periapical abscess without sinus: Secondary | ICD-10-CM | POA: Insufficient documentation

## 2012-11-12 DIAGNOSIS — K029 Dental caries, unspecified: Secondary | ICD-10-CM | POA: Insufficient documentation

## 2012-11-12 DIAGNOSIS — Z87891 Personal history of nicotine dependence: Secondary | ICD-10-CM | POA: Insufficient documentation

## 2012-11-12 NOTE — ED Notes (Signed)
Patient seen last night for facial swelling on right side of face.  Patient states that she laid down tonight and woke up with the swelling again.

## 2012-11-13 MED ORDER — HYDROCODONE-ACETAMINOPHEN 5-325 MG PO TABS
1.0000 | ORAL_TABLET | Freq: Once | ORAL | Status: AC
  Administered 2012-11-13: 1 via ORAL
  Filled 2012-11-13: qty 1

## 2012-11-13 MED ORDER — AMOXICILLIN 500 MG PO CAPS: 500.0000 mg | ORAL_CAPSULE | Freq: Three times a day (TID) | ORAL | Status: DC

## 2012-11-13 MED ORDER — AMOXICILLIN 500 MG PO CAPS
500.0000 mg | ORAL_CAPSULE | Freq: Once | ORAL | Status: AC
Start: 2012-11-13 — End: 2012-11-13
  Administered 2012-11-13: 500 mg via ORAL
  Filled 2012-11-13: qty 1

## 2012-11-13 NOTE — ED Notes (Signed)
Pt c/o pain and swelling to right side of face onset yesterday, worse today.  Also st's she has had pain in tooth on that side since yesterday

## 2012-11-13 NOTE — ED Provider Notes (Signed)
Medical screening examination/treatment/procedure(s) were performed by non-physician practitioner and as supervising physician I was immediately available for consultation/collaboration.  Jasmine Awe, MD 11/13/12 [phone]

## 2012-11-13 NOTE — ED Provider Notes (Signed)
History     CSN: [id number]  Arrival date & time 11/12/12  2325   First MD Initiated Contact with Patient 11/13/12 [phone]      Chief Complaint  Patient presents with  . Facial Swelling    (Consider location/radiation/quality/duration/timing/severity/associated sxs/prior treatment) HPI Megan Valenzuela is a 32 y.o. female who presents to ED with complaint of right facial swelling and dental pain. Pt was seen yesterday for same problem, diagnosed with a tooth abscess. States today swelling not improved, feels like worsening. Pt did not fill her antibiotic medication, bc states she could not afford it. States no fever, chills, headache, neck pain or stiffness, no malaise. Pain does not radiate. Pain is sharp.    History reviewed. No pertinent past medical history.  Past Surgical History  Procedure Laterality Date  . Tympanostomy tube placement      No family history on file.  History  Substance Use Topics  . Smoking status: Former Games developer  . Smokeless tobacco: Never Used  . Alcohol Use: No    OB History   Grav Para Term Preterm Abortions TAB SAB Ect Mult Living                  Review of Systems  Constitutional: Negative for fever and chills.  HENT: Positive for facial swelling and dental problem. Negative for ear pain and congestion.   Neurological: Negative for headaches.    Allergies  Coconut flavor and Latex  Home Medications   Current Outpatient Rx  Name  Route  Sig  Dispense  Refill  . HYDROcodone-acetaminophen (NORCO/VICODIN) 5-325 MG per tablet   Oral   Take 1 tablet by mouth every 4 (four) hours as needed for pain.   15 tablet   0   . penicillin v potassium (VEETID) 500 MG tablet   Oral   Take 1 tablet (500 mg total) by mouth 4 (four) times daily.   40 tablet   0     BP 122/83  Pulse 63  Temp(Src) 98.8 F (37.1 C) (Oral)  Resp 18  SpO2 99%  LMP 10/14/2012  Physical Exam  Nursing note and vitals reviewed. Constitutional: She appears  well-developed and well-nourished. No distress.  HENT:  Right upper 1st bicuspid carries with surrounding gum swelling and an abscess. Tender to palpation. Minimal right maxilary facial swelling.   Neck: Neck supple.  Cardiovascular: Normal rate, regular rhythm and normal heart sounds.   Pulmonary/Chest: Effort normal and breath sounds normal. No respiratory distress. She has no wheezes. She has no rales.  Lymphadenopathy:    She has no cervical adenopathy.  Skin: Skin is warm and dry.    ED Course  Procedures (including critical care time)  Labs Reviewed - No data to display No results found.   1. Dental abscess       MDM  Pt with right facial swelling. Will switch to amoxicillin, on 4 dollar list. Given first does in ED. Pt is non toxic, afebrile, joking and smiling int he room. Plan to follow up with an oral surgeon.    Filed Vitals:   11/12/12 2336  BP: 122/83  Pulse: 63  Temp: 98.8 F (37.1 C)  TempSrc: Oral  Resp: 18  SpO2: 99%       Dorsey Authement A Hailey Miles, PA-C 11/13/12 0041

## 2012-11-25 ENCOUNTER — Encounter (HOSPITAL_COMMUNITY): Payer: Self-pay | Admitting: Adult Health

## 2012-11-25 ENCOUNTER — Emergency Department (HOSPITAL_COMMUNITY)
Admission: EM | Admit: 2012-11-25 | Discharge: 2012-11-25 | Disposition: A | Discharge status: Home / Self Care | Payer: Medicaid Other | Attending: Emergency Medicine | Admitting: Emergency Medicine

## 2012-11-25 DIAGNOSIS — IMO0001 Reserved for inherently not codable concepts without codable children: Secondary | ICD-10-CM | POA: Insufficient documentation

## 2012-11-25 DIAGNOSIS — Z87891 Personal history of nicotine dependence: Secondary | ICD-10-CM | POA: Insufficient documentation

## 2012-11-25 DIAGNOSIS — M722 Plantar fascial fibromatosis: Secondary | ICD-10-CM | POA: Insufficient documentation

## 2012-11-25 MED ORDER — IBUPROFEN 800 MG PO TABS: 800.0000 mg | ORAL_TABLET | Freq: Three times a day (TID) | ORAL | Status: DC

## 2012-11-25 NOTE — ED Notes (Addendum)
Presents with left bottom of foot pain that began 4 days ago, pain is located in arch of foot. Described as tightness and "it just hurts" nothing makes pain better, standing ans walking make pain worse.  Denies injury, no edema, no deformity.

## 2012-11-25 NOTE — ED Provider Notes (Signed)
History    This chart was scribed for non-physician practitioner, Roxy Horseman PA-C working with Vida Roller, MD by Donne Anon, ED Scribe. This patient was seen in room TR11C/TR11C and the patient's care was started at 1727.   CSN: [id number]  Arrival date & time 11/25/12  1631   First MD Initiated Contact with Patient 11/25/12 1727      Chief Complaint  Patient presents with  . Foot Pain     The history is provided by the patient. No language interpreter was used.   HPI Comments: Megan Valenzuela is a 32 y.o. female who presents to the Emergency Department complaining of 4 days of gradual onset, gradually worsening, constant left foot pain localized in her arch. She denies any recent injury. She states walking makes the pain worse. She denies swelling, numbness or any other pain. She states she often wears flip flops and has a new job where she stands more than usual. She has not tried any OTC medication.   History reviewed. No pertinent past medical history.  Past Surgical History  Procedure Laterality Date  . Tympanostomy tube placement      History reviewed. No pertinent family history.  History  Substance Use Topics  . Smoking status: Former Games developer  . Smokeless tobacco: Never Used  . Alcohol Use: No    OB History   Grav Para Term Preterm Abortions TAB SAB Ect Mult Living                  Review of Systems  Musculoskeletal: Positive for myalgias. Negative for joint swelling.  All other systems reviewed and are negative.    Allergies  Coconut flavor and Latex  Home Medications  No current outpatient prescriptions on file.  BP 128/76  Pulse 95  Temp(Src) 98.7 F (37.1 C) (Oral)  Resp 16  SpO2 96%  LMP 10/14/2012  Physical Exam  Nursing note and vitals reviewed. Constitutional: She is oriented to person, place, and time. She appears well-developed and well-nourished. No distress.  HENT:  Head: Normocephalic and atraumatic.  Eyes: EOM are  normal.  Neck: Neck supple. No tracheal deviation present.  Cardiovascular: Normal rate and intact distal pulses.   Distal pulses intact. Brisk capillary refill.   Pulmonary/Chest: Effort normal. No respiratory distress.  Musculoskeletal: Normal range of motion.  Tenderness to palpation of left plantar fascia. No bony tenderness. No obvious abnormality or deformity.   Neurological: She is alert and oriented to person, place, and time.  Sensation intact.  Skin: Skin is warm and dry.  Psychiatric: She has a normal mood and affect. Her behavior is normal.    ED Course  Procedures (including critical care time) DIAGNOSTIC STUDIES: Oxygen Saturation is 96% on room air, adequate by my interpretation.    COORDINATION OF CARE: 6:15 PM Discussed treatment plan which includes shoes with good arch support, ice massage, gentle stretching, and ibuprofen 3x daily for 1 week with pt at bedside and pt agreed to plan. Discussed plantar fasciitis diagnosis. Will refer to a foot and ankle physician. Advised pt to follow up in 1 week if symptoms have not improved.  6:21 PM Pt clears [place] rules for ankle imaging, no imaging required at this time.     Labs Reviewed - No data to display No results found.   1. Plantar fasciitis       MDM  Patient with plantar fasciitis. No imaging required at this time based on [place] rules. Will discharge with ibuprofen, and  rice therapy.   I personally performed the services described in this documentation, which was scribed in my presence. The recorded information has been reviewed and is accurate.        Roxy Horseman, PA-C 11/25/12 2344

## 2012-11-25 NOTE — ED Notes (Signed)
Pt ambulated with no noted difficulty.

## 2012-11-26 ENCOUNTER — Emergency Department (HOSPITAL_COMMUNITY)
Admission: EM | Admit: 2012-11-26 | Discharge: 2012-11-26 | Disposition: A | Discharge status: Home / Self Care | Payer: Medicaid Other | Attending: Emergency Medicine | Admitting: Emergency Medicine

## 2012-11-26 ENCOUNTER — Encounter (HOSPITAL_COMMUNITY): Payer: Self-pay | Admitting: *Deleted

## 2012-11-26 DIAGNOSIS — K089 Disorder of teeth and supporting structures, unspecified: Secondary | ICD-10-CM | POA: Insufficient documentation

## 2012-11-26 DIAGNOSIS — Z87891 Personal history of nicotine dependence: Secondary | ICD-10-CM | POA: Insufficient documentation

## 2012-11-26 DIAGNOSIS — K0889 Other specified disorders of teeth and supporting structures: Secondary | ICD-10-CM

## 2012-11-26 DIAGNOSIS — R110 Nausea: Secondary | ICD-10-CM | POA: Insufficient documentation

## 2012-11-26 MED ORDER — ONDANSETRON HCL 4 MG PO TABS: 4.0000 mg | ORAL_TABLET | Freq: Three times a day (TID) | ORAL | Status: DC | PRN

## 2012-11-26 MED ORDER — PENICILLIN V POTASSIUM 500 MG PO TABS: 500.0000 mg | ORAL_TABLET | Freq: Three times a day (TID) | ORAL | Status: DC

## 2012-11-26 NOTE — ED Provider Notes (Signed)
History  This chart was scribed for non-physician practitioner Roxy Horseman, PA-C working with Ward Givens, MD, by Candelaria Stagers, ED Scribe. This patient was seen in room TR06C/TR06C and the patient's care was started at 10:36 PM   CSN: [id number]  Arrival date & time 11/26/12  2045   First MD Initiated Contact with Patient 11/26/12 2111      Chief Complaint  Patient presents with  . Dental Pain     The history is provided by the patient. No language interpreter was used.   HPI Comments: Megan Valenzuela is a 32 y.o. female who presents to the Emergency Department complaining of sudden onset of left lower dental pain that started today.  She reports sharp pains to the tooth and bleeding from the tooth.  She has taken hydrocodone with no relief and reports associated nausea.     History reviewed. No pertinent past medical history.  Past Surgical History  Procedure Laterality Date  . Tympanostomy tube placement      No family history on file.  History  Substance Use Topics  . Smoking status: Former Games developer  . Smokeless tobacco: Never Used  . Alcohol Use: No    OB History   Grav Para Term Preterm Abortions TAB SAB Ect Mult Living                  Review of Systems  HENT: Positive for dental problem (left lower dental pain).   Gastrointestinal: Positive for nausea.  All other systems reviewed and are negative.    Allergies  Coconut flavor and Latex  Home Medications  No current outpatient prescriptions on file.  BP 148/98  Pulse 80  Temp(Src) 99 F (37.2 C) (Oral)  Resp 18  SpO2 98%  LMP 10/14/2012  Physical Exam  Nursing note and vitals reviewed. Constitutional: She is oriented to person, place, and time. She appears well-developed and well-nourished. No distress.  HENT:  Head: Normocephalic and atraumatic.  Mouth/Throat:    Poor dentition throughout.  Affected tooth as diagrammed.  No signs of peritonsillar or tonsillar abscess.  No signs of  gingival abscess. Oropharynx is clear and without exudates.  Uvula is midline.  Airway is intact. No signs of Ludwig's angina.   Eyes: EOM are normal.  Neck: Neck supple. No tracheal deviation present.  Cardiovascular: Normal rate.   Pulmonary/Chest: Effort normal. No respiratory distress.  Musculoskeletal: Normal range of motion.  Neurological: She is alert and oriented to person, place, and time.  Skin: Skin is warm and dry.  Psychiatric: She has a normal mood and affect. Her behavior is normal.    ED Course  Procedures   DIAGNOSTIC STUDIES: Oxygen Saturation is 98% on room air, normal by my interpretation.    COORDINATION OF CARE:  10:38 PM Discussed course of care with pt which includes antibiotic and follow up with a dentist.  Pt understands and agrees.    Labs Reviewed - No data to display No results found.   1. Pain, dental       MDM  Patient with uncomplicated dental pain. Treat the patient with penicillin. She has hydrocodone at home. She is stable and ready for discharge. Recommend dental followup.  I personally performed the services described in this documentation, which was scribed in my presence. The recorded information has been reviewed and is accurate.         Roxy Horseman, PA-C 11/27/12 0002  Roxy Horseman, PA-C 12/01/12 [phone]

## 2012-11-26 NOTE — ED Notes (Addendum)
Toothache: left lower back tooth. Tooth is deteriorating."root problem."  Spit some blood but not now. Took x 2 Vicodin this morning without relief but makes her sick to stomach.

## 2012-11-26 NOTE — ED Notes (Signed)
Pt states "Every time my heart beats, the blood blood come, so I guess I ruptured a nerve." No bleeding noted at this time.

## 2012-11-26 NOTE — ED Provider Notes (Signed)
Medical screening examination/treatment/procedure(s) were performed by non-physician practitioner and as supervising physician I was immediately available for consultation/collaboration.    Vida Roller, MD 11/26/12 [phone]

## 2012-12-02 NOTE — ED Provider Notes (Signed)
Medical screening examination/treatment/procedure(s) were performed by non-physician practitioner and as supervising physician I was immediately available for consultation/collaboration. Devoria Albe, MD, Armando Gang   Ward Givens, MD 12/02/12 Marlyne Beards

## 2012-12-11 ENCOUNTER — Encounter (HOSPITAL_COMMUNITY): Payer: Self-pay | Admitting: Emergency Medicine

## 2012-12-11 ENCOUNTER — Emergency Department (HOSPITAL_COMMUNITY)
Admission: EM | Admit: 2012-12-11 | Discharge: 2012-12-12 | Disposition: A | Discharge status: Home / Self Care | Payer: Medicaid Other | Attending: Emergency Medicine | Admitting: Emergency Medicine

## 2012-12-11 DIAGNOSIS — Z792 Long term (current) use of antibiotics: Secondary | ICD-10-CM | POA: Insufficient documentation

## 2012-12-11 DIAGNOSIS — H9209 Otalgia, unspecified ear: Secondary | ICD-10-CM | POA: Insufficient documentation

## 2012-12-11 DIAGNOSIS — Z87891 Personal history of nicotine dependence: Secondary | ICD-10-CM | POA: Insufficient documentation

## 2012-12-11 DIAGNOSIS — Z79899 Other long term (current) drug therapy: Secondary | ICD-10-CM | POA: Insufficient documentation

## 2012-12-11 DIAGNOSIS — K0889 Other specified disorders of teeth and supporting structures: Secondary | ICD-10-CM

## 2012-12-11 DIAGNOSIS — K089 Disorder of teeth and supporting structures, unspecified: Secondary | ICD-10-CM | POA: Insufficient documentation

## 2012-12-11 DIAGNOSIS — R110 Nausea: Secondary | ICD-10-CM | POA: Insufficient documentation

## 2012-12-11 MED ORDER — AMOXICILLIN 500 MG PO CAPS: 500.0000 mg | ORAL_CAPSULE | Freq: Three times a day (TID) | ORAL | Status: DC

## 2012-12-11 MED ORDER — OXYCODONE-ACETAMINOPHEN 5-325 MG PO TABS: 1.0000 | ORAL_TABLET | ORAL | Status: DC | PRN

## 2012-12-11 MED ORDER — OXYCODONE-ACETAMINOPHEN 5-325 MG PO TABS
2.0000 | ORAL_TABLET | Freq: Once | ORAL | Status: AC
  Administered 2012-12-11: 2 via ORAL
  Filled 2012-12-11: qty 2

## 2012-12-11 NOTE — ED Notes (Signed)
PT. REPORTS PERSISTENT LEFT LOWER MOLAR PAIN FOR SEVERAL DAYS .

## 2012-12-11 NOTE — ED Provider Notes (Signed)
History  This chart was scribed for Marlon Pel, MS, PA-C working with Derwood Kaplan, MD by Ardelia Mems, ED Scribe. This patient was seen in room TR05C/TR05C and the patient's care was started at 11:21 PM.   CSN: [id number]  Arrival date & time 12/11/12  2241    Chief Complaint  Patient presents with  . Dental Pain     The history is provided by the patient. No language interpreter was used.    HPI Comments: Megan Valenzuela is a 32 y.o. female who presents to the Emergency Department complaining of constant, moderate left lower molar pain of 4 days duration. Pt states that the pain radiates to her left ear and "is in her brain". Pt reports associated nausea and difficulty sleeping. Pt states that she finished a course of Penicillin at 9 PM tonight. Pt states that she has been taking Ibuprofen and 5 mg Hydrocodones without relief. Pt denies alcohol use and is a former smoker. Pt denies fever, chills, vomiting, diarrhea or any other symptoms.   History reviewed. No pertinent past medical history.  Past Surgical History  Procedure Laterality Date  . Tympanostomy tube placement      No family history on file.  History  Substance Use Topics  . Smoking status: Former Games developer  . Smokeless tobacco: Never Used  . Alcohol Use: No    OB History   Grav Para Term Preterm Abortions TAB SAB Ect Mult Living                  Review of Systems  Constitutional: Negative for fever and chills.  HENT: Positive for ear pain and dental problem.   Gastrointestinal: Positive for nausea. Negative for vomiting and diarrhea.   A complete 10 system review of systems was obtained and all systems are negative except as noted in the HPI and PMH.   Allergies  Coconut flavor and Latex  Home Medications   Current Outpatient Rx  Name  Route  Sig  Dispense  Refill  . HYDROcodone-acetaminophen (NORCO/VICODIN) 5-325 MG per tablet   Oral   Take 1 tablet by mouth every 4 (four) hours as needed for  pain.         Marland Kitchen ibuprofen (ADVIL,MOTRIN) 200 MG tablet   Oral   Take 600 mg by mouth every 5 (five) hours as needed for pain.         . Naphazoline HCl (CLEAR EYES OP)   Both Eyes   Place 3 drops into both eyes daily as needed (for itchy eyes).         Marland Kitchen amoxicillin (AMOXIL) 500 MG capsule   Oral   Take 1 capsule (500 mg total) by mouth 3 (three) times daily.   21 capsule   0   . oxyCODONE-acetaminophen (PERCOCET/ROXICET) 5-325 MG per tablet   Oral   Take 1-2 tablets by mouth every 4 (four) hours as needed for pain.   15 tablet   0   . penicillin v potassium (VEETID) 500 MG tablet   Oral   Take 1 tablet (500 mg total) by mouth 3 (three) times daily.   30 tablet   0     Triage Vitals: BP 140/86  Pulse 73  Temp(Src) 100 F (37.8 C) (Oral)  Resp 20  SpO2 100%  LMP 10/14/2012  Physical Exam  Nursing note and vitals reviewed. Constitutional: She is oriented to person, place, and time. She appears well-developed and well-nourished. No distress.  HENT:  Head: Normocephalic and  atraumatic.  Mouth/Throat: Uvula is midline, oropharynx is clear and moist and mucous membranes are normal. Normal dentition. Dental caries (Pts tooth shows no obvious abscess but moderate to severe tenderness to palpation of marked tooth) present. No edematous.  Eyes: EOM are normal. Pupils are equal, round, and reactive to light.  Neck: Trachea normal, normal range of motion and full passive range of motion without pain. Neck supple.  Cardiovascular: Normal rate, regular rhythm, normal heart sounds and normal pulses.   Pulmonary/Chest: Effort normal and breath sounds normal. No respiratory distress. Chest wall is not dull to percussion. She exhibits no tenderness, no crepitus, no edema, no deformity and no retraction.  Abdominal: Soft. Normal appearance. She exhibits no distension. There is no tenderness.  Musculoskeletal: Normal range of motion.  Neurological: She is alert and oriented to  person, place, and time. She has normal strength.  Skin: Skin is warm, dry and intact. She is not diaphoretic.  Psychiatric: She has a normal mood and affect. Her speech is normal. Judgment normal. Cognition and memory are normal.    ED Course  Procedures (including critical care time)  DIAGNOSTIC STUDIES: Oxygen Saturation is 100% on RA, normal by my interpretation.    COORDINATION OF CARE: 11:29 PM- Pt advised of plan for treatment and pt agrees.  Given referral to dentist   Labs Reviewed - No data to display No results found.   1. Toothache       MDM   32 y.o.Megan Valenzuela's evaluation in the Emergency Department is complete. It has been determined that no acute conditions requiring further emergency intervention are present at this time. The patient/guardian have been advised of the diagnosis and plan. We have discussed signs and symptoms that warrant return to the ED, such as changes or worsening in symptoms.  Vital signs are stable at discharge. Filed Vitals:   12/11/12 2245  BP: 140/86  Pulse: 73  Temp: 100 F (37.8 C)  Resp: 20    Patient/guardian has voiced understanding and agreed to follow-up with the PCP or specialist.  I personally performed the services described in this documentation, which was scribed in my presence. The recorded information has been reviewed and is accurate.    Dorthula Matas, PA-C 12/11/12 2340

## 2012-12-13 NOTE — ED Provider Notes (Signed)
Medical screening examination/treatment/procedure(s) were performed by non-physician practitioner and as supervising physician I was immediately available for consultation/collaboration.   Joya Gaskins, MD 12/13/12 [phone]

## 2013-01-16 ENCOUNTER — Encounter (HOSPITAL_COMMUNITY): Payer: Self-pay | Admitting: Emergency Medicine

## 2013-01-16 ENCOUNTER — Emergency Department (HOSPITAL_COMMUNITY)
Admission: EM | Admit: 2013-01-16 | Discharge: 2013-01-16 | Disposition: A | Discharge status: Home / Self Care | Payer: Medicaid Other | Attending: Emergency Medicine | Admitting: Emergency Medicine

## 2013-01-16 DIAGNOSIS — Z87891 Personal history of nicotine dependence: Secondary | ICD-10-CM | POA: Insufficient documentation

## 2013-01-16 DIAGNOSIS — K047 Periapical abscess without sinus: Secondary | ICD-10-CM | POA: Insufficient documentation

## 2013-01-16 HISTORY — DX: Periapical abscess without sinus: K04.7

## 2013-01-16 MED ORDER — HYDROCODONE-ACETAMINOPHEN 5-325 MG PO TABS: 1.0000 | ORAL_TABLET | Freq: Four times a day (QID) | ORAL | Status: DC | PRN

## 2013-01-16 MED ORDER — CLINDAMYCIN HCL 150 MG PO CAPS: 150.0000 mg | ORAL_CAPSULE | Freq: Four times a day (QID) | ORAL | Status: DC

## 2013-01-16 NOTE — ED Provider Notes (Signed)
   History    CSN: [id number] Arrival date & time 01/16/13  1101  First MD Initiated Contact with Patient 01/16/13 1124     Chief Complaint  Patient presents with  . Dental Problem   (Consider location/radiation/quality/duration/timing/severity/associated sxs/prior Treatment) HPI  31 year old female presents complaining of dental pain. Patient reports for the past month she has had pain to several different teeth in her mouth. Describe pain as a soreness achy sensation with swelling in the gum, pus drainage, and nasty taste in her mouth. She has been seen twice in the ED for this complaint last month and was given referral to an oral surgeon. She has followup with her surgeon, Dr. Barbette Merino a week ago, states she is scheduled to have eight dental distractions on July 23rd. However, within the past 2-3 days she has had increasing dental pain. Pain is primarily to her left upper and lower teeth. Pain is persistent despite gargle with saltwater, and take ibuprofen. Patient denies fever, headache, neck pain, rash. No other complaints.  Pt is a former smoker.  Past Medical History  Diagnosis Date  . Dental abscess    Past Surgical History  Procedure Laterality Date  . Tympanostomy tube placement     No family history on file. History  Substance Use Topics  . Smoking status: Former Games developer  . Smokeless tobacco: Never Used  . Alcohol Use: No   OB History   Grav Para Term Preterm Abortions TAB SAB Ect Mult Living                 Review of Systems  Constitutional: Negative for fever.  HENT: Positive for dental problem.   Skin: Negative for rash.    Allergies  Coconut flavor and Latex  Home Medications   Current Outpatient Rx  Name  Route  Sig  Dispense  Refill  . ibuprofen (ADVIL,MOTRIN) 200 MG tablet   Oral   Take 600 mg by mouth every 5 (five) hours as needed for pain.         . Naphazoline HCl (CLEAR EYES OP)   Both Eyes   Place 3 drops into both eyes daily as needed  (for itchy eyes).          BP 128/81  Pulse 85  Temp(Src) 98.3 F (36.8 C) (Oral)  Resp 16  SpO2 96% Physical Exam  Nursing note and vitals reviewed. Constitutional: She appears well-developed and well-nourished. No distress.  HENT:  Head: Normocephalic and atraumatic.  Mouth/Throat:    Eyes: Conjunctivae are normal.  Neck: Neck supple.  Lymphadenopathy:    She has no cervical adenopathy.  Neurological: She is alert.  Skin: No rash noted.    ED Course  Procedures (including critical care time)  11:40 AM Pt with significant dental decay, has schedule with Dr. Barbette Merino on July 23rd.  Since abscesses are not amenable to drainage at this time, will d/c with pain meds and clindamycin abx.  Care instruction provide.  Pt will contact her doctor on Monday for close f/u.   Labs Reviewed - No data to display No results found. 1. Abscess, dental     MDM  BP 128/81  Pulse 85  Temp(Src) 98.3 F (36.8 C) (Oral)  Resp 16  SpO2 96%   Fayrene Helper, PA-C 01/16/13 1147

## 2013-01-16 NOTE — ED Notes (Signed)
Onset one month ago having intermittent dental pain and dental abscess right upper and left upper side of mouth.  Pain currently 8/10 achy throbbing. Airway intact bilateral equal chest rise and fall resting comfortably on stretcher.

## 2013-01-16 NOTE — ED Notes (Signed)
Patient not in room

## 2013-01-16 NOTE — ED Provider Notes (Signed)
Medical screening examination/treatment/procedure(s) were performed by non-physician practitioner and as supervising physician I was immediately available for consultation/collaboration.  Brezlyn Manrique L Syria Kestner, MD 01/16/13 2053 

## 2013-01-18 ENCOUNTER — Encounter (HOSPITAL_COMMUNITY): Payer: Self-pay | Admitting: Family Medicine

## 2013-01-18 ENCOUNTER — Emergency Department (HOSPITAL_COMMUNITY)
Admission: EM | Admit: 2013-01-18 | Discharge: 2013-01-18 | Disposition: A | Discharge status: Home / Self Care | Payer: Medicaid Other | Attending: Emergency Medicine | Admitting: Emergency Medicine

## 2013-01-18 DIAGNOSIS — Z91040 Latex allergy status: Secondary | ICD-10-CM | POA: Insufficient documentation

## 2013-01-18 DIAGNOSIS — B373 Candidiasis of vulva and vagina: Secondary | ICD-10-CM

## 2013-01-18 DIAGNOSIS — Z881 Allergy status to other antibiotic agents status: Secondary | ICD-10-CM | POA: Insufficient documentation

## 2013-01-18 DIAGNOSIS — Z87891 Personal history of nicotine dependence: Secondary | ICD-10-CM | POA: Insufficient documentation

## 2013-01-18 DIAGNOSIS — Z3202 Encounter for pregnancy test, result negative: Secondary | ICD-10-CM | POA: Insufficient documentation

## 2013-01-18 DIAGNOSIS — B3731 Acute candidiasis of vulva and vagina: Secondary | ICD-10-CM | POA: Insufficient documentation

## 2013-01-18 LAB — WET PREP, GENITAL
Clue Cells Wet Prep HPF POC: NONE SEEN
Trich, Wet Prep: NONE SEEN

## 2013-01-18 LAB — URINE MICROSCOPIC-ADD ON

## 2013-01-18 LAB — PREGNANCY, URINE: Preg Test, Ur: NEGATIVE

## 2013-01-18 LAB — URINALYSIS, ROUTINE W REFLEX MICROSCOPIC
Bilirubin Urine: NEGATIVE
Glucose, UA: NEGATIVE mg/dL
Hgb urine dipstick: NEGATIVE
Ketones, ur: NEGATIVE mg/dL
Nitrite: NEGATIVE
Protein, ur: NEGATIVE mg/dL
Specific Gravity, Urine: 1.026 (ref 1.005–1.030)
Urobilinogen, UA: 1 mg/dL (ref 0.0–1.0)
pH: 5.5 (ref 5.0–8.0)

## 2013-01-18 MED ORDER — PENICILLIN V POTASSIUM 500 MG PO TABS: 500.0000 mg | ORAL_TABLET | Freq: Four times a day (QID) | ORAL | Status: DC

## 2013-01-18 MED ORDER — FLUCONAZOLE 100 MG PO TABS
150.0000 mg | ORAL_TABLET | Freq: Once | ORAL | Status: DC
  Filled 2013-01-18: qty 2

## 2013-01-18 MED ORDER — FLUCONAZOLE 100 MG PO TABS
200.0000 mg | ORAL_TABLET | Freq: Once | ORAL | Status: AC
  Administered 2013-01-18: 200 mg via ORAL

## 2013-01-18 NOTE — ED Notes (Signed)
Pt made aware need for urine sample. Given urine cup.

## 2013-01-18 NOTE — ED Notes (Signed)
Per pt sts was recently here for tooth infection and given abx. sts since has had yeast infection with burning. sts she wants a new medication.

## 2013-01-18 NOTE — ED Provider Notes (Signed)
History    CSN: [id number] Arrival date & time 01/18/13  1252  First MD Initiated Contact with Patient 01/18/13 1502     Chief Complaint  Patient presents with  . Vaginitis   (Consider location/radiation/quality/duration/timing/severity/associated sxs/prior Treatment) HPI Patient presents emergency Department with complaints of vaginal irritation and itching.  Patient, states it started after she started taking clindamycin. the patient denies chest pain, shortness breath, nausea, vomiting, abdominal pain, headache, or weakness.  Patient, states she did not take any medications prior to arrival.  Patient, states, that nothing seems make her condition, better or worse Past Medical History  Diagnosis Date  . Dental abscess    Past Surgical History  Procedure Laterality Date  . Tympanostomy tube placement     History reviewed. No pertinent family history. History  Substance Use Topics  . Smoking status: Former Games developer  . Smokeless tobacco: Never Used  . Alcohol Use: No   OB History   Grav Para Term Preterm Abortions TAB SAB Ect Mult Living                 Review of Systems All other systems negative except as documented in the HPI. All pertinent positives and negatives as reviewed in the HPI. Allergies  Clindamycin/lincomycin; Coconut flavor; and Latex  Home Medications   Current Outpatient Rx  Name  Route  Sig  Dispense  Refill  . HYDROcodone-acetaminophen (NORCO/VICODIN) 5-325 MG per tablet   Oral   Take 1 tablet by mouth every 6 (six) hours as needed for pain.   20 tablet   0   . ibuprofen (ADVIL,MOTRIN) 200 MG tablet   Oral   Take 600 mg by mouth every 5 (five) hours as needed for pain.         . Naphazoline HCl (CLEAR EYES OP)   Both Eyes   Place 3 drops into both eyes daily as needed (for itchy eyes).         . clindamycin (CLEOCIN) 150 MG capsule   Oral   Take 1 capsule (150 mg total) by mouth every 6 (six) hours.   28 capsule   0    BP 99/66   Pulse 72  Temp(Src) 98.2 F (36.8 C) (Oral)  Resp 16  SpO2 98% Physical Exam  Constitutional: She is oriented to person, place, and time. She appears well-developed and well-nourished. No distress.  HENT:  Head: Normocephalic and atraumatic.  Cardiovascular: Normal rate and regular rhythm.   Pulmonary/Chest: Effort normal and breath sounds normal.  Abdominal: Soft. Bowel sounds are normal. She exhibits no distension. There is no tenderness.  Genitourinary: Vagina normal. Cervix exhibits discharge. Cervix exhibits no motion tenderness and no friability. Right adnexum displays no mass, no tenderness and no fullness. Left adnexum displays no mass, no tenderness and no fullness.  Neurological: She is alert and oriented to person, place, and time.  Skin: Skin is warm and dry.    ED Course  Procedures (including critical care time) Labs Reviewed  WET PREP, GENITAL - Abnormal; Notable for the following:    Yeast Wet Prep HPF POC FEW (*)    WBC, Wet Prep HPF POC FEW (*)    All other components within normal limits  URINALYSIS, ROUTINE W REFLEX MICROSCOPIC - Abnormal; Notable for the following:    Color, Urine AMBER (*)    APPearance CLOUDY (*)    Leukocytes, UA MODERATE (*)    All other components within normal limits  URINE MICROSCOPIC-ADD ON - Abnormal;  Notable for the following:    Squamous Epithelial / LPF FEW (*)    Bacteria, UA FEW (*)    All other components within normal limits  GC/CHLAMYDIA PROBE AMP  URINE CULTURE  PREGNANCY, URINE   \Patient be treated for candidal infection of the vaginal area.  She is told to return here as needed.  Advised to follow up with her GYN doctor.  Also educate the patient on antibiotic use and its side effect of vaginal yeast overgrowth  MDM    Carlyle Dolly, PA-C 01/18/13 1647

## 2013-01-19 LAB — URINE CULTURE
Colony Count: NO GROWTH
Culture: NO GROWTH

## 2013-01-19 LAB — GC/CHLAMYDIA PROBE AMP
CT Probe RNA: NEGATIVE
GC Probe RNA: NEGATIVE

## 2013-01-19 NOTE — ED Provider Notes (Signed)
Medical screening examination/treatment/procedure(s) were performed by non-physician practitioner and as supervising physician I was immediately available for consultation/collaboration.    Christopher J. Pollina, MD 01/19/13 1611 

## 2013-01-30 ENCOUNTER — Emergency Department (HOSPITAL_COMMUNITY)
Admission: EM | Admit: 2013-01-30 | Discharge: 2013-01-30 | Discharge status: Against Medical Advice | Payer: Medicaid Other | Attending: Emergency Medicine | Admitting: Emergency Medicine

## 2013-01-30 ENCOUNTER — Encounter (HOSPITAL_COMMUNITY): Payer: Self-pay | Admitting: *Deleted

## 2013-01-30 DIAGNOSIS — R509 Fever, unspecified: Secondary | ICD-10-CM | POA: Insufficient documentation

## 2013-01-30 DIAGNOSIS — Z87891 Personal history of nicotine dependence: Secondary | ICD-10-CM | POA: Insufficient documentation

## 2013-01-30 DIAGNOSIS — R110 Nausea: Secondary | ICD-10-CM | POA: Insufficient documentation

## 2013-01-30 NOTE — ED Notes (Signed)
Pt not in lobby x 2 when called to go back to room

## 2013-01-30 NOTE — ED Notes (Signed)
Pt reports having dental extractions recently and now isnt feeling well. Reports fevers, nausea, no vomiting. No acute distress noted at this time, airway is intact.

## 2013-01-30 NOTE — ED Notes (Signed)
Called for pt with no answer 

## 2013-01-30 NOTE — ED Notes (Signed)
Pt did not answer x 3 

## 2013-06-20 ENCOUNTER — Encounter (HOSPITAL_COMMUNITY): Payer: Self-pay | Admitting: Emergency Medicine

## 2013-06-20 ENCOUNTER — Emergency Department (HOSPITAL_COMMUNITY): Payer: Medicaid Other

## 2013-06-20 ENCOUNTER — Emergency Department (HOSPITAL_COMMUNITY)
Admission: EM | Admit: 2013-06-20 | Discharge: 2013-06-20 | Disposition: A | Discharge status: Home / Self Care | Payer: Medicaid Other | Attending: Emergency Medicine | Admitting: Emergency Medicine

## 2013-06-20 DIAGNOSIS — E669 Obesity, unspecified: Secondary | ICD-10-CM | POA: Insufficient documentation

## 2013-06-20 DIAGNOSIS — R0789 Other chest pain: Secondary | ICD-10-CM | POA: Insufficient documentation

## 2013-06-20 DIAGNOSIS — Z8719 Personal history of other diseases of the digestive system: Secondary | ICD-10-CM | POA: Insufficient documentation

## 2013-06-20 DIAGNOSIS — Z87891 Personal history of nicotine dependence: Secondary | ICD-10-CM | POA: Insufficient documentation

## 2013-06-20 DIAGNOSIS — Z91040 Latex allergy status: Secondary | ICD-10-CM | POA: Insufficient documentation

## 2013-06-20 NOTE — ED Provider Notes (Signed)
CSN: [id number]     Arrival date & time 06/20/13  1548 History   First MD Initiated Contact with Patient 06/20/13 1721     Chief Complaint  Patient presents with  . movement in chest    (Consider location/radiation/quality/duration/timing/severity/associated sxs/prior Treatment) The history is provided by the patient and medical records.   This is a 32 y.o. F with no significant PMH presenting to the ED for "movement" in her chest for the past 5 days.  Pt denies chest pain or SOB.  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  States after her work-outs she always uses the sauna and hot tub-- states this is when she senses the movement.  States it feels different than just normal palpitations.  No personal or family cardiac hx.  Former smoker.  States she has been taking gas-x with some improvement of symptoms.  Past Medical History  Diagnosis Date  . Dental abscess    Past Surgical History  Procedure Laterality Date  . Tympanostomy tube placement     No family history on file. History  Substance Use Topics  . Smoking status: Former Games developer  . Smokeless tobacco: Never Used  . Alcohol Use: No   OB History   Grav Para Term Preterm Abortions TAB SAB Ect Mult Living                 Review of Systems  Cardiovascular:       "movement in chest"  All other systems reviewed and are negative.    Allergies  Coconut flavor and Latex  Home Medications  No current outpatient prescriptions on file. BP 135/80  Pulse 102  Temp(Src) 98 F (36.7 C) (Oral)  Resp 18  Wt 282 lb 3.2 oz (128.005 kg)  SpO2 98%  Physical Exam  Nursing note and vitals reviewed. Constitutional: She is oriented to person, place, and time. She appears well-developed and well-nourished. No distress.  Obese, NAD, texting on cell phone  HENT:  Head: Normocephalic and atraumatic.  Mouth/Throat: Oropharynx is clear and moist.  Eyes: Conjunctivae and EOM are normal.  Pupils are equal, round, and reactive to light.  Neck: Normal range of motion. Neck supple.  Cardiovascular: Normal rate, regular rhythm and normal heart sounds.   Pulmonary/Chest: Effort normal and breath sounds normal. No respiratory distress. She has no wheezes.  Chest wall non-tender to palpation  Abdominal: Soft. Bowel sounds are normal. There is no tenderness. There is no guarding.  Musculoskeletal: Normal range of motion. She exhibits no edema.  Neurological: She is alert and oriented to person, place, and time.  Skin: Skin is warm and dry. She is not diaphoretic.  Psychiatric: She has a normal mood and affect.    ED Course  Procedures (including critical care time) Labs Review Labs Reviewed - No data to display Imaging Review No results found.  EKG Interpretation    Date/Time:  Sunday June 20 2013 15:54:47 EST Ventricular Rate:  97 PR Interval:  126 QRS Duration: 88 QT Interval:  348 QTC Calculation: 441 R Axis:   48 Text Interpretation:  Normal sinus rhythm Normal ECG Confirmed by HORTON  MD, COURTNEY ([id number]) on 06/20/2013 6:43:27 PM            MDM   1. Chest discomfort    EKG NSR, no acute ischemic changes.  CXR negative for acute findings.  Pt without chest pain or SOB, VS have remained stable-- low suspicion for ACS, PE,  dissection, or other acute cardiac event.  Pt reassured.  Instructed she may continue taking gas-x and tums as needed for sx if it continue to help.  Strict return precautions advised should sx change or worsen.  Garlon Hatchet, PA-C 06/20/13 2017

## 2013-06-20 NOTE — ED Notes (Signed)
The pt is c/o some type movement in her rt mid-chest for 5 days.  No pain.  In talking to the pt she just started working out and when she finishes she gets into a hot tube and that is when she has irregular  Beating of her heart and that feels like movement

## 2013-06-20 NOTE — ED Provider Notes (Signed)
Medical screening examination/treatment/procedure(s) were performed by non-physician practitioner and as supervising physician I was immediately available for consultation/collaboration.  EKG Interpretation    Date/Time:  Sunday June 20 2013 15:54:47 EST Ventricular Rate:  97 PR Interval:  126 QRS Duration: 88 QT Interval:  348 QTC Calculation: 441 R Axis:   48 Text Interpretation:  Normal sinus rhythm Normal ECG Confirmed by Wilkie Aye  MD, COURTNEY ([id number]) on 06/20/2013 6:43:27 PM             Shon Baton, MD 06/20/13 2220

## 2013-07-16 ENCOUNTER — Encounter (HOSPITAL_COMMUNITY): Payer: Self-pay | Admitting: Emergency Medicine

## 2013-07-16 ENCOUNTER — Emergency Department (HOSPITAL_COMMUNITY): Payer: Medicaid Other

## 2013-07-16 DIAGNOSIS — S298XXA Other specified injuries of thorax, initial encounter: Secondary | ICD-10-CM | POA: Insufficient documentation

## 2013-07-16 DIAGNOSIS — S301XXA Contusion of abdominal wall, initial encounter: Secondary | ICD-10-CM | POA: Insufficient documentation

## 2013-07-16 DIAGNOSIS — Z3202 Encounter for pregnancy test, result negative: Secondary | ICD-10-CM | POA: Insufficient documentation

## 2013-07-16 DIAGNOSIS — Y92410 Unspecified street and highway as the place of occurrence of the external cause: Secondary | ICD-10-CM | POA: Insufficient documentation

## 2013-07-16 DIAGNOSIS — Z91040 Latex allergy status: Secondary | ICD-10-CM | POA: Insufficient documentation

## 2013-07-16 DIAGNOSIS — IMO0002 Reserved for concepts with insufficient information to code with codable children: Secondary | ICD-10-CM | POA: Insufficient documentation

## 2013-07-16 DIAGNOSIS — R000 Tachycardia, unspecified: Secondary | ICD-10-CM | POA: Insufficient documentation

## 2013-07-16 DIAGNOSIS — E669 Obesity, unspecified: Secondary | ICD-10-CM | POA: Insufficient documentation

## 2013-07-16 DIAGNOSIS — Y9389 Activity, other specified: Secondary | ICD-10-CM | POA: Insufficient documentation

## 2013-07-16 DIAGNOSIS — Z87891 Personal history of nicotine dependence: Secondary | ICD-10-CM | POA: Insufficient documentation

## 2013-07-16 DIAGNOSIS — S60229A Contusion of unspecified hand, initial encounter: Secondary | ICD-10-CM | POA: Insufficient documentation

## 2013-07-16 LAB — CBC WITH DIFFERENTIAL/PLATELET
Basophils Absolute: 0 10*3/uL (ref 0.0–0.1)
Basophils Relative: 0 % (ref 0–1)
Eosinophils Absolute: 0.3 10*3/uL (ref 0.0–0.7)
Eosinophils Relative: 3 % (ref 0–5)
HEMATOCRIT: 39.3 % (ref 36.0–46.0)
HEMOGLOBIN: 13.3 g/dL (ref 12.0–15.0)
LYMPHS ABS: 3.2 10*3/uL (ref 0.7–4.0)
LYMPHS PCT: 33 % (ref 12–46)
MCH: 28.9 pg (ref 26.0–34.0)
MCHC: 33.8 g/dL (ref 30.0–36.0)
MCV: 85.2 fL (ref 78.0–100.0)
MONO ABS: 0.8 10*3/uL (ref 0.1–1.0)
MONOS PCT: 8 % (ref 3–12)
NEUTROS ABS: 5.4 10*3/uL (ref 1.7–7.7)
NEUTROS PCT: 56 % (ref 43–77)
Platelets: 237 10*3/uL (ref 150–400)
RBC: 4.61 MIL/uL (ref 3.87–5.11)
RDW: 13.7 % (ref 11.5–15.5)
WBC: 9.7 10*3/uL (ref 4.0–10.5)

## 2013-07-16 LAB — URINALYSIS, ROUTINE W REFLEX MICROSCOPIC
Bilirubin Urine: NEGATIVE
GLUCOSE, UA: NEGATIVE mg/dL
Hgb urine dipstick: NEGATIVE
KETONES UR: NEGATIVE mg/dL
LEUKOCYTES UA: NEGATIVE
NITRITE: NEGATIVE
PH: 7 (ref 5.0–8.0)
Protein, ur: NEGATIVE mg/dL
SPECIFIC GRAVITY, URINE: 1.023 (ref 1.005–1.030)
Urobilinogen, UA: 0.2 mg/dL (ref 0.0–1.0)

## 2013-07-16 LAB — BASIC METABOLIC PANEL
BUN: 12 mg/dL (ref 6–23)
CHLORIDE: 101 meq/L (ref 96–112)
CO2: 25 meq/L (ref 19–32)
CREATININE: 0.88 mg/dL (ref 0.50–1.10)
Calcium: 9.3 mg/dL (ref 8.4–10.5)
GFR calc non Af Amer: 87 mL/min — ABNORMAL LOW (ref >90)
GLUCOSE: 94 mg/dL (ref 70–99)
POTASSIUM: 4.3 meq/L (ref 3.7–5.3)
Sodium: 140 mEq/L (ref 137–147)
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
## 2013-07-17 ENCOUNTER — Encounter (HOSPITAL_COMMUNITY): Payer: Self-pay | Admitting: Radiology

## 2013-07-17 ENCOUNTER — Emergency Department (HOSPITAL_COMMUNITY)
Admission: EM | Admit: 2013-07-17 | Discharge: 2013-07-17 | Disposition: A | Discharge status: Home / Self Care | Payer: Medicaid Other | Attending: Emergency Medicine | Admitting: Emergency Medicine

## 2013-07-17 ENCOUNTER — Emergency Department (HOSPITAL_COMMUNITY): Payer: Medicaid Other

## 2013-07-17 DIAGNOSIS — S301XXA Contusion of abdominal wall, initial encounter: Secondary | ICD-10-CM

## 2013-07-17 DIAGNOSIS — T148XXA Other injury of unspecified body region, initial encounter: Secondary | ICD-10-CM

## 2013-07-17 DIAGNOSIS — S60222A Contusion of left hand, initial encounter: Secondary | ICD-10-CM

## 2013-07-17 LAB — PREGNANCY, URINE: Preg Test, Ur: NEGATIVE

## 2013-07-17 MED ORDER — SODIUM CHLORIDE 0.9 % IV SOLN
Freq: Once | INTRAVENOUS | Status: AC
Start: 2013-07-17 — End: 2013-07-17
  Administered 2013-07-17: 1000 mL via INTRAVENOUS

## 2013-07-17 MED ORDER — OXYCODONE-ACETAMINOPHEN 5-325 MG PO TABS
2.0000 | ORAL_TABLET | Freq: Once | ORAL | Status: AC
  Administered 2013-07-17: 2 via ORAL
  Filled 2013-07-17: qty 2

## 2013-07-17 MED ORDER — IBUPROFEN 600 MG PO TABS: 600.0000 mg | ORAL_TABLET | Freq: Four times a day (QID) | ORAL | Status: DC | PRN

## 2013-07-17 MED ORDER — HYDROMORPHONE HCL PF 1 MG/ML IJ SOLN
1.0000 mg | Freq: Once | INTRAMUSCULAR | Status: DC
  Filled 2013-07-17: qty 1

## 2013-07-17 MED ORDER — IOHEXOL 300 MG/ML  SOLN
100.0000 mL | Freq: Once | INTRAMUSCULAR | Status: AC | PRN
  Administered 2013-07-17: 100 mL via INTRAVENOUS

## 2013-07-17 MED ORDER — OXYCODONE-ACETAMINOPHEN 5-325 MG PO TABS: 1.0000 | ORAL_TABLET | Freq: Four times a day (QID) | ORAL | Status: DC | PRN

## 2013-07-17 MED ORDER — ONDANSETRON HCL 4 MG/2ML IJ SOLN
4.0000 mg | Freq: Once | INTRAMUSCULAR | Status: DC
  Filled 2013-07-17: qty 2

## 2013-07-17 NOTE — ED Notes (Signed)
Patient transported to XR. 

## 2013-07-17 NOTE — ED Provider Notes (Signed)
Medical screening examination/treatment/procedure(s) were performed by non-physician practitioner and as supervising physician I was immediately available for consultation/collaboration.  Flint MelterElliott L Azyiah Bo, MD 07/17/13 [phone]

## 2013-07-17 NOTE — ED Notes (Signed)
Pt is still in radiology. CT to attempt to start an IV.

## 2013-07-17 NOTE — ED Notes (Signed)
Tomasa BlaseSchultz, NP at bedside.
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
## 2013-07-17 NOTE — ED Notes (Signed)
Asked for and received a cup of ice water.

## 2013-07-17 NOTE — Discharge Instructions (Signed)
Your x-rays, and CT scan are normal.  You have been given a prescription for Percocet to help control your pain.  Please take this as directed along with the ibuprofen, and as discussed please apply warm compresses/heating pad to your bruises.  This will help them heal faster

## 2013-07-27 ENCOUNTER — Emergency Department (HOSPITAL_COMMUNITY)
Admission: EM | Admit: 2013-07-27 | Discharge: 2013-07-27 | Disposition: A | Discharge status: Home / Self Care | Payer: Medicaid Other | Attending: Emergency Medicine | Admitting: Emergency Medicine

## 2013-07-27 ENCOUNTER — Encounter (HOSPITAL_COMMUNITY): Payer: Self-pay | Admitting: Emergency Medicine

## 2013-07-27 DIAGNOSIS — M79609 Pain in unspecified limb: Secondary | ICD-10-CM | POA: Insufficient documentation

## 2013-07-27 DIAGNOSIS — J45909 Unspecified asthma, uncomplicated: Secondary | ICD-10-CM | POA: Insufficient documentation

## 2013-07-27 HISTORY — DX: Unspecified asthma, uncomplicated: J45.909

## 2013-07-27 LAB — CBC WITH DIFFERENTIAL/PLATELET
BASOS ABS: 0 10*3/uL (ref 0.0–0.1)
BASOS PCT: 0 % (ref 0–1)
EOS ABS: 0.2 10*3/uL (ref 0.0–0.7)
Eosinophils Relative: 3 % (ref 0–5)
HCT: 38.8 % (ref 36.0–46.0)
Hemoglobin: 13 g/dL (ref 12.0–15.0)
Lymphocytes Relative: 36 % (ref 12–46)
Lymphs Abs: 2.7 10*3/uL (ref 0.7–4.0)
MCH: 28.7 pg (ref 26.0–34.0)
MCHC: 33.5 g/dL (ref 30.0–36.0)
MCV: 85.7 fL (ref 78.0–100.0)
MONOS PCT: 7 % (ref 3–12)
Monocytes Absolute: 0.6 10*3/uL (ref 0.1–1.0)
NEUTROS PCT: 54 % (ref 43–77)
Neutro Abs: 4.1 10*3/uL (ref 1.7–7.7)
PLATELETS: 251 10*3/uL (ref 150–400)
RBC: 4.53 MIL/uL (ref 3.87–5.11)
RDW: 13.9 % (ref 11.5–15.5)
WBC: 7.7 10*3/uL (ref 4.0–10.5)

## 2013-07-27 LAB — COMPREHENSIVE METABOLIC PANEL
ALBUMIN: 3.9 g/dL (ref 3.5–5.2)
ALK PHOS: 102 U/L (ref 39–117)
ALT: 21 U/L (ref 0–35)
AST: 19 U/L (ref 0–37)
BUN: 8 mg/dL (ref 6–23)
CO2: 25 mEq/L (ref 19–32)
Calcium: 9.2 mg/dL (ref 8.4–10.5)
Chloride: 101 mEq/L (ref 96–112)
Creatinine, Ser: 0.86 mg/dL (ref 0.50–1.10)
GFR calc Af Amer: >90 mL/min (ref >90)
GFR calc non Af Amer: 90 mL/min — ABNORMAL LOW (ref >90)
Glucose, Bld: 89 mg/dL (ref 70–99)
POTASSIUM: 4 meq/L (ref 3.7–5.3)
SODIUM: 140 meq/L (ref 137–147)
TOTAL PROTEIN: 8.4 g/dL — AB (ref 6.0–8.3)
Total Bilirubin: 0.3 mg/dL (ref 0.3–1.2)

## 2013-07-27 NOTE — ED Notes (Addendum)
Both legs swelling since today  Feels tight  No injury  No heart problems started to take some weight loss meds this month

## 2013-07-27 NOTE — ED Notes (Signed)
Pt called no answer x1 

## 2013-10-11 ENCOUNTER — Emergency Department (HOSPITAL_COMMUNITY)
Admission: EM | Admit: 2013-10-11 | Discharge: 2013-10-12 | Disposition: A | Discharge status: Home / Self Care | Payer: Medicaid Other | Attending: Emergency Medicine | Admitting: Emergency Medicine

## 2013-10-11 DIAGNOSIS — J45909 Unspecified asthma, uncomplicated: Secondary | ICD-10-CM | POA: Insufficient documentation

## 2013-10-11 DIAGNOSIS — J029 Acute pharyngitis, unspecified: Secondary | ICD-10-CM

## 2013-10-11 DIAGNOSIS — Z91040 Latex allergy status: Secondary | ICD-10-CM | POA: Insufficient documentation

## 2013-10-11 DIAGNOSIS — Z8719 Personal history of other diseases of the digestive system: Secondary | ICD-10-CM | POA: Insufficient documentation

## 2013-10-11 DIAGNOSIS — Z87891 Personal history of nicotine dependence: Secondary | ICD-10-CM | POA: Insufficient documentation

## 2013-10-11 LAB — RAPID STREP SCREEN (MED CTR MEBANE ONLY): STREPTOCOCCUS, GROUP A SCREEN (DIRECT): NEGATIVE

## 2013-10-11 NOTE — ED Notes (Signed)
Pt c/o sore throat for one week. Pt reports taking Theraflu and Alkaseltzer without relief. Airway is intact, no acute respiratory distress noted. Pt is A&Ox4, respirations equal and unlabored, skin warm and dry.

## 2013-10-12 NOTE — ED Provider Notes (Signed)
Medical screening examination/treatment/procedure(s) were performed by non-physician practitioner and as supervising physician I was immediately available for consultation/collaboration.   EKG Interpretation None        Regan Mcbryar, MD 10/12/13 0735 

## 2013-10-12 NOTE — ED Provider Notes (Signed)
CSN: [id number]     Arrival date & time 10/11/13  2034 History   First MD Initiated Contact with Patient 10/11/13 2344     Chief Complaint  Patient presents with  . Sore Throat     (Consider location/radiation/quality/duration/timing/severity/associated sxs/prior Treatment) HPI History provided by pt.   Pt presents w/ sore throat x 1 week.  Has had relief w/ theraflu.  Associated w/ nasal congestion.  No known fever and denies dyspnea, dysphagia, cough.  Mother has similar sx.  No PMH.    Past Medical History  Diagnosis Date  . Dental abscess   . Asthma    Past Surgical History  Procedure Laterality Date  . Tympanostomy tube placement     No family history on file. History  Substance Use Topics  . Smoking status: Former Games developermoker  . Smokeless tobacco: Never Used  . Alcohol Use: No   OB History   Grav Para Term Preterm Abortions TAB SAB Ect Mult Living                 Review of Systems  All other systems reviewed and are negative.      Allergies  Coconut flavor and Latex  Home Medications  No current outpatient prescriptions on file. BP 129/74  Pulse 85  Temp(Src) 98.4 F (36.9 C) (Oral)  Resp 18  Ht 5\' 7"  (1.702 m)  Wt 283 lb 4.8 oz (128.504 kg)  BMI 44.36 kg/m2  SpO2 97% Physical Exam  Nursing note and vitals reviewed. Constitutional: She is oriented to person, place, and time. She appears well-developed and well-nourished. No distress.  HENT:  Head: Normocephalic and atraumatic.  Posterior pharynx minimally erythematous.  No tonsillar edema or exudate.  Uvula mid-line.  No trismus.    Eyes:  Normal appearance  Neck: Normal range of motion. Neck supple.  Cardiovascular: Normal rate and regular rhythm.   Pulmonary/Chest: Effort normal and breath sounds normal. No respiratory distress.  Musculoskeletal: Normal range of motion.  Lymphadenopathy:    She has no cervical adenopathy.  Neurological: She is alert and oriented to person, place, and time.  Skin:  Skin is warm and dry. No rash noted.  Psychiatric: She has a normal mood and affect. Her behavior is normal.    ED Course  Procedures (including critical care time) Labs Review Labs Reviewed  RAPID STREP SCREEN  CULTURE, GROUP A STREP   Imaging Review No results found.   EKG Interpretation None      MDM   Final diagnoses:  Viral pharyngitis    32yo healthy F presents w/ 1 week sore throat. Low clinical suspicion for strep pharyngitis and rapid strep negative.  Will treat symptomatically for viral pharyngitis w/ ibuprofen/tylenol prn, rest and fluids.  Return precautions discussed.     Otilio Miuatherine E Naz Denunzio, PA-C 10/12/13 0024  Arie Sabinaatherine E Arthur Aydelotte, PA-C 10/12/13 0025

## 2013-10-12 NOTE — Discharge Instructions (Signed)
Take ibuprofen with food, up to 800mg  three times a day, as needed for pain.  You can alternate this medication with tylenol.   Get rest and drink plenty of fluids.  Return to the ER if your pain worsens or you have difficulty with breathing or swallowing.

## 2013-10-12 NOTE — ED Notes (Addendum)
Pt. Left prior to being given discharge papers.

## 2013-10-13 LAB — CULTURE, GROUP A STREP

## 2013-11-20 ENCOUNTER — Encounter (HOSPITAL_COMMUNITY): Payer: Self-pay | Admitting: Emergency Medicine

## 2013-11-20 ENCOUNTER — Emergency Department (HOSPITAL_COMMUNITY)
Admission: EM | Admit: 2013-11-20 | Discharge: 2013-11-20 | Disposition: A | Discharge status: Home / Self Care | Payer: Medicaid Other | Attending: Emergency Medicine | Admitting: Emergency Medicine

## 2013-11-20 DIAGNOSIS — Z87891 Personal history of nicotine dependence: Secondary | ICD-10-CM | POA: Insufficient documentation

## 2013-11-20 DIAGNOSIS — J45909 Unspecified asthma, uncomplicated: Secondary | ICD-10-CM | POA: Insufficient documentation

## 2013-11-20 DIAGNOSIS — Z91040 Latex allergy status: Secondary | ICD-10-CM | POA: Insufficient documentation

## 2013-11-20 DIAGNOSIS — Z8719 Personal history of other diseases of the digestive system: Secondary | ICD-10-CM | POA: Insufficient documentation

## 2013-11-20 DIAGNOSIS — J329 Chronic sinusitis, unspecified: Secondary | ICD-10-CM | POA: Insufficient documentation

## 2013-11-20 LAB — RAPID STREP SCREEN (MED CTR MEBANE ONLY): Streptococcus, Group A Screen (Direct): NEGATIVE

## 2013-11-20 MED ORDER — AMOXICILLIN 500 MG PO CAPS: 500.0000 mg | ORAL_CAPSULE | Freq: Three times a day (TID) | ORAL | Status: DC

## 2013-11-20 NOTE — Discharge Instructions (Signed)
Take amoxicillin as directed until gone. Refer to attached documents for more information.  °

## 2013-11-20 NOTE — ED Notes (Signed)
Presents with sore throat began one week ago associated with nasal drainage and feeling hot yesterday. OTC medications not helping. Describes throat as itchy and irritation.

## 2013-11-20 NOTE — ED Provider Notes (Signed)
CSN: [id number]     Arrival date & time 11/20/13  1521 History  This chart was scribed for  Megan BeckKaitlyn Lanyah Spengler, PA by Megan Valenzuela, ED Scribe. This patient was seen in room TR07C/TR07C and the patient's care was started at 4:44 PM.    Chief Complaint  Patient presents with  . Sore Throat    Patient is a 33 y.o. female presenting with pharyngitis. The history is provided by the patient. No language interpreter was used.  Sore Throat The current episode started more than 1 week ago. The problem occurs constantly. She has tried acetaminophen for the symptoms.   HPI Comments: Megan Valenzuela is a 33 y.o. female who presents to the Emergency Department complaining of sore throat onset 1 week prior. States she has associated rhinorrhea, cough, sinus pressure and drainage. She states that she has been trying different OTC medication with no relief to her She states that she has not made any recent sick contacts. She denies any other related symptoms.   Past Medical History  Diagnosis Date  . Dental abscess   . Asthma    Past Surgical History  Procedure Laterality Date  . Tympanostomy tube placement     History reviewed. No pertinent family history. History  Substance Use Topics  . Smoking status: Former Games developermoker  . Smokeless tobacco: Never Used  . Alcohol Use: No   OB History   Grav Para Term Preterm Abortions TAB SAB Ect Mult Living                 Review of Systems  HENT: Positive for rhinorrhea, sinus pressure and sore throat.   All other systems reviewed and are negative.  Allergies  Coconut flavor and Latex  Home Medications   Prior to Admission medications   Not on File   Triage Vitals: BP 142/104  Pulse 92  Temp(Src) 98.3 F (36.8 C) (Oral)  Resp 18  Wt 275 lb 6.4 oz (124.921 kg)  SpO2 97%  LMP 10/21/2013  Physical Exam  Nursing note and vitals reviewed. Constitutional: She is oriented to person, place, and time. She appears well-developed and  well-nourished. No distress.  HENT:  Head: Normocephalic and atraumatic.  Right Ear: External ear normal.  Left Ear: External ear normal.  no sinus tenderness to palpation, one isolated erythematous ulceration noted on posterior soft palate, no tonsillar edema or exudate,  Eyes: EOM are normal.  Neck: Neck supple. No tracheal deviation present.  Cardiovascular: Normal rate.   Pulmonary/Chest: Effort normal. No respiratory distress.  Musculoskeletal: Normal range of motion.  Lymphadenopathy:    She has cervical adenopathy.  Neurological: She is alert and oriented to person, place, and time.  Skin: Skin is warm and dry.  Psychiatric: She has a normal mood and affect. Her behavior is normal.    ED Course  Procedures (including critical care time) DIAGNOSTIC STUDIES: Oxygen Saturation is 97% on RA, adequate by my interpretation.    COORDINATION OF CARE: 4:52 PM-Discussed treatment plan which includes medications with pt at bedside and pt agreed to plan.      Labs Review Labs Reviewed  RAPID STREP SCREEN  CULTURE, GROUP A STREP    Imaging Review No results found.   EKG Interpretation None      MDM   Final diagnoses:  Sinusitis    Patient will have amoxicillin for sinusitis. Vitals stable and patient afebrile.   I personally performed the services described in this documentation, which was scribed in my presence.  The recorded information has been reviewed and is accurate.    Megan BeckKaitlyn Heide Brossart, PA-C 11/20/13 2048

## 2013-11-22 NOTE — ED Provider Notes (Signed)
Medical screening examination/treatment/procedure(s) were performed by non-physician practitioner and as supervising physician I was immediately available for consultation/collaboration.   EKG Interpretation None       Amyrie Illingworth R. Jefry Lesinski, MD 11/22/13 0008 

## 2013-11-23 LAB — CULTURE, GROUP A STREP

## 2014-02-16 ENCOUNTER — Emergency Department (HOSPITAL_COMMUNITY)
Admission: EM | Admit: 2014-02-16 | Discharge: 2014-02-16 | Discharge status: Against Medical Advice | Payer: Medicaid Other | Attending: Emergency Medicine | Admitting: Emergency Medicine

## 2014-02-16 ENCOUNTER — Encounter (HOSPITAL_COMMUNITY): Payer: Self-pay | Admitting: Emergency Medicine

## 2014-02-16 DIAGNOSIS — Z91040 Latex allergy status: Secondary | ICD-10-CM | POA: Insufficient documentation

## 2014-02-16 DIAGNOSIS — Z3202 Encounter for pregnancy test, result negative: Secondary | ICD-10-CM | POA: Diagnosis not present

## 2014-02-16 DIAGNOSIS — Z87891 Personal history of nicotine dependence: Secondary | ICD-10-CM | POA: Insufficient documentation

## 2014-02-16 DIAGNOSIS — R5383 Other fatigue: Secondary | ICD-10-CM

## 2014-02-16 DIAGNOSIS — R1033 Periumbilical pain: Secondary | ICD-10-CM | POA: Insufficient documentation

## 2014-02-16 DIAGNOSIS — E669 Obesity, unspecified: Secondary | ICD-10-CM | POA: Insufficient documentation

## 2014-02-16 DIAGNOSIS — J45909 Unspecified asthma, uncomplicated: Secondary | ICD-10-CM | POA: Insufficient documentation

## 2014-02-16 DIAGNOSIS — R1084 Generalized abdominal pain: Secondary | ICD-10-CM

## 2014-02-16 DIAGNOSIS — R109 Unspecified abdominal pain: Secondary | ICD-10-CM | POA: Insufficient documentation

## 2014-02-16 DIAGNOSIS — R5381 Other malaise: Secondary | ICD-10-CM | POA: Diagnosis not present

## 2014-02-16 DIAGNOSIS — Z8719 Personal history of other diseases of the digestive system: Secondary | ICD-10-CM | POA: Diagnosis not present

## 2014-02-16 LAB — URINALYSIS, ROUTINE W REFLEX MICROSCOPIC
Bilirubin Urine: NEGATIVE
Glucose, UA: NEGATIVE mg/dL
HGB URINE DIPSTICK: NEGATIVE
Ketones, ur: NEGATIVE mg/dL
LEUKOCYTES UA: NEGATIVE
Nitrite: NEGATIVE
PROTEIN: NEGATIVE mg/dL
Specific Gravity, Urine: 1.022 (ref 1.005–1.030)
UROBILINOGEN UA: 0.2 mg/dL (ref 0.0–1.0)
pH: 5 (ref 5.0–8.0)

## 2014-02-16 LAB — CBC WITH DIFFERENTIAL/PLATELET
Basophils Absolute: 0 10*3/uL (ref 0.0–0.1)
Basophils Relative: 0 % (ref 0–1)
EOS ABS: 0.3 10*3/uL (ref 0.0–0.7)
EOS PCT: 4 % (ref 0–5)
HCT: 39.8 % (ref 36.0–46.0)
HEMOGLOBIN: 12.8 g/dL (ref 12.0–15.0)
LYMPHS ABS: 3.2 10*3/uL (ref 0.7–4.0)
Lymphocytes Relative: 37 % (ref 12–46)
MCH: 28.2 pg (ref 26.0–34.0)
MCHC: 32.2 g/dL (ref 30.0–36.0)
MCV: 87.7 fL (ref 78.0–100.0)
MONOS PCT: 8 % (ref 3–12)
Monocytes Absolute: 0.7 10*3/uL (ref 0.1–1.0)
Neutro Abs: 4.5 10*3/uL (ref 1.7–7.7)
Neutrophils Relative %: 51 % (ref 43–77)
PLATELETS: 185 10*3/uL (ref 150–400)
RBC: 4.54 MIL/uL (ref 3.87–5.11)
RDW: 13.9 % (ref 11.5–15.5)
WBC: 8.7 10*3/uL (ref 4.0–10.5)

## 2014-02-16 LAB — COMPREHENSIVE METABOLIC PANEL
ALT: 15 U/L (ref 0–35)
ANION GAP: 14 (ref 5–15)
AST: 18 U/L (ref 0–37)
Albumin: 3.7 g/dL (ref 3.5–5.2)
Alkaline Phosphatase: 77 U/L (ref 39–117)
BUN: 10 mg/dL (ref 6–23)
CALCIUM: 8.8 mg/dL (ref 8.4–10.5)
CO2: 21 mEq/L (ref 19–32)
Chloride: 104 mEq/L (ref 96–112)
Creatinine, Ser: 0.8 mg/dL (ref 0.50–1.10)
GFR calc non Af Amer: >90 mL/min (ref >90)
GLUCOSE: 85 mg/dL (ref 70–99)
Potassium: 4 mEq/L (ref 3.7–5.3)
Sodium: 139 mEq/L (ref 137–147)
Total Bilirubin: 0.2 mg/dL — ABNORMAL LOW (ref 0.3–1.2)
Total Protein: 7.5 g/dL (ref 6.0–8.3)

## 2014-02-16 LAB — WET PREP, GENITAL
Clue Cells Wet Prep HPF POC: NONE SEEN
Trich, Wet Prep: NONE SEEN
Yeast Wet Prep HPF POC: NONE SEEN

## 2014-02-16 LAB — LIPASE, BLOOD: Lipase: 32 U/L (ref 11–59)

## 2014-02-16 LAB — POC URINE PREG, ED: PREG TEST UR: NEGATIVE

## 2014-02-16 NOTE — ED Notes (Signed)
Pelvic cart set up at bedside  

## 2014-02-16 NOTE — ED Provider Notes (Signed)
CSN: [id number]     Arrival date & time 02/16/14  1441 History   First MD Initiated Contact with Patient 02/16/14 1723     Chief Complaint  Patient presents with  . Abdominal Pain     (Consider location/radiation/quality/duration/timing/severity/associated sxs/prior Treatment) HPI Pt is a 33yo female presenting to ED with c/o gradually worsening diffuse abdominal pain with associated generalized weakness and fatigue that started earlier this morning. Pain is sharp, stabbing, 9/10 at worst, worse in pelvic region.  Pt states she thinks she may have a tampon in her vagina x1 day but is not sure.  Denies fever, n/v/d. States she is coming off of her menstrual cycle but denies vaginal discharge or bleeding. Denies urinary symptoms.    Past Medical History  Diagnosis Date  . Dental abscess   . Asthma    Past Surgical History  Procedure Laterality Date  . Tympanostomy tube placement     No family history on file. History  Substance Use Topics  . Smoking status: Former Games developermoker  . Smokeless tobacco: Never Used  . Alcohol Use: No   OB History   Grav Para Term Preterm Abortions TAB SAB Ect Mult Living                 Review of Systems  Constitutional: Positive for fatigue. Negative for fever, chills and appetite change.  Respiratory: Negative for cough and shortness of breath.   Cardiovascular: Negative for chest pain and palpitations.  Gastrointestinal: Positive for abdominal pain. Negative for nausea, vomiting, diarrhea, constipation and blood in stool.  Genitourinary: Positive for vaginal discharge and pelvic pain. Negative for dysuria, urgency, frequency, hematuria, flank pain, decreased urine volume, vaginal bleeding, vaginal pain and menstrual problem.  Neurological: Positive for weakness ( generalized). Negative for syncope, numbness and headaches.  All other systems reviewed and are negative.     Allergies  Coconut flavor and Latex  Home Medications   Prior to  Admission medications   Not on File   BP 129/79  Pulse 88  Temp(Src) 98.6 F (37 C) (Oral)  Resp 16  SpO2 99% Physical Exam  Nursing note and vitals reviewed. Constitutional: She appears well-developed and well-nourished. No distress.  Obese female lying on exam bed, NAD.  HENT:  Head: Normocephalic and atraumatic.  Eyes: Conjunctivae are normal. No scleral icterus.  Neck: Normal range of motion.  Cardiovascular: Normal rate, regular rhythm and normal heart sounds.   Pulmonary/Chest: Effort normal and breath sounds normal. No respiratory distress. She has no wheezes. She has no rales. She exhibits no tenderness.  Abdominal: Soft. Bowel sounds are normal. She exhibits no distension and no mass. There is tenderness. There is no rebound and no guarding.  Obese abdomen, soft, diffuse tenderness, worse in pelvic region.  Genitourinary:  Chaperoned exam. Normal external exam. Vaginal exam: no foreign bodies, including tampons, seen.  Scant yellow discharge present. Mild CMT, no adnexal tenderness or masses.   Musculoskeletal: Normal range of motion.  Neurological: She is alert.  Skin: Skin is warm and dry. She is not diaphoretic.    ED Course  Procedures (including critical care time) Labs Review Labs Reviewed  WET PREP, GENITAL - Abnormal; Notable for the following:    WBC, Wet Prep HPF POC MODERATE (*)    All other components within normal limits  COMPREHENSIVE METABOLIC PANEL - Abnormal; Notable for the following:    Total Bilirubin 0.2 (*)    All other components within normal limits  GC/CHLAMYDIA PROBE AMP  CBC WITH DIFFERENTIAL  LIPASE, BLOOD  URINALYSIS, ROUTINE W REFLEX MICROSCOPIC  POC URINE PREG, ED    Imaging Review No results found.   EKG Interpretation None      MDM   Final diagnoses:  None    Pt is a 33yo female presenting to ED with c/o diffuse abdominal pain with associated generalized weakness and fatigue x1 day.  On exam, obese, soft abdomen,  diffuse tenderness, worse in lower abdomen in pelvic region. Pelvic exam: scant yellow discharge present with mild CMT.  Pt left immediately after pelvic exam was performed, prior to workup being completed. Stated she will return tomorrow if she needs to.     Junius Finner, PA-C 02/16/14 1900

## 2014-02-16 NOTE — ED Notes (Signed)
Pt states "I have to leave by 6pm" EDPA made aware.

## 2014-02-16 NOTE — ED Notes (Addendum)
Pt presents to department for evaluation of diffuse abdominal pain. Onset yesterday. Pt states she feels weak and tired. Pt is alert and oriented x4. 8/10 pain upon arrival. No nausea/vomiting. Pt also states she could have tampon lodged in vagina x1 day, but is unsure.

## 2014-02-17 LAB — GC/CHLAMYDIA PROBE AMP
CT Probe RNA: NEGATIVE
GC Probe RNA: NEGATIVE

## 2014-02-17 NOTE — ED Provider Notes (Signed)
Medical screening examination/treatment/procedure(s) were performed by non-physician practitioner and as supervising physician I was immediately available for consultation/collaboration.   EKG Interpretation None        [name] Record, MD 02/17/14 [phone]

## 2014-11-13 ENCOUNTER — Encounter (HOSPITAL_COMMUNITY): Payer: Self-pay | Admitting: *Deleted

## 2014-11-13 DIAGNOSIS — Z87891 Personal history of nicotine dependence: Secondary | ICD-10-CM | POA: Diagnosis not present

## 2014-11-13 DIAGNOSIS — Z3202 Encounter for pregnancy test, result negative: Secondary | ICD-10-CM | POA: Diagnosis not present

## 2014-11-13 DIAGNOSIS — R05 Cough: Secondary | ICD-10-CM | POA: Diagnosis present

## 2014-11-13 DIAGNOSIS — Z91040 Latex allergy status: Secondary | ICD-10-CM | POA: Insufficient documentation

## 2014-11-13 DIAGNOSIS — J45909 Unspecified asthma, uncomplicated: Secondary | ICD-10-CM | POA: Diagnosis not present

## 2014-11-13 DIAGNOSIS — Z8719 Personal history of other diseases of the digestive system: Secondary | ICD-10-CM | POA: Insufficient documentation

## 2014-11-13 LAB — URINALYSIS, ROUTINE W REFLEX MICROSCOPIC
BILIRUBIN URINE: NEGATIVE
Glucose, UA: NEGATIVE mg/dL
Hgb urine dipstick: NEGATIVE
Ketones, ur: NEGATIVE mg/dL
Leukocytes, UA: NEGATIVE
Nitrite: NEGATIVE
PROTEIN: NEGATIVE mg/dL
Specific Gravity, Urine: 1.021 (ref 1.005–1.030)
UROBILINOGEN UA: 1 mg/dL (ref 0.0–1.0)
pH: 7.5 (ref 5.0–8.0)

## 2014-11-13 LAB — POC URINE PREG, ED: Preg Test, Ur: NEGATIVE

## 2014-11-13 NOTE — ED Notes (Signed)
The pt is c/o a headache sore throat   With a noin-productive cough sweating intermittently. foir 3 days.  shes afraid she is going to die from pneumonia lmp march

## 2014-11-14 ENCOUNTER — Emergency Department (HOSPITAL_COMMUNITY)
Admission: EM | Admit: 2014-11-14 | Discharge: 2014-11-14 | Disposition: A | Discharge status: Home / Self Care | Payer: Medicaid Other | Attending: Emergency Medicine | Admitting: Emergency Medicine

## 2014-11-14 DIAGNOSIS — J309 Allergic rhinitis, unspecified: Secondary | ICD-10-CM

## 2014-11-14 NOTE — ED Notes (Signed)
Pt seen and examined by EDP, informed by EDP that they were clear for discharge. Pt left before being seen and assessed by this RN. Left prior to discharge teaching/VS/esignature.

## 2014-11-14 NOTE — ED Provider Notes (Signed)
CSN: [id number]     Arrival date & time 11/13/14  2310 History   First MD Initiated Contact with Patient 11/14/14 0022     This chart was scribed for Megan MoMatthew Berton Butrick, MD by Arlan OrganAshley Leger, ED Scribe. This patient was seen in room D34C/D34C and the patient's care was started 12:29 AM.   Chief Complaint  Patient presents with  . Cough   The history is provided by the patient. No language interpreter was used.    HPI Comments: Megan SparkKeonte Nashay Valenzuela is a 34 y.o. female with a PMHx of Asthma and seasonal allergies who presents to the Emergency Department complaining of constant, ongoing, unchanged sore throat x 3 days. Feeling described as "itchy" which is exacerbated with coughing. She also reports a HA that has been ongoing for a few days. Ms. Megan Valenzuela has not tried any OTC medications or home remedies to help manage symptoms. No recent nausea, vomiting, diarrhea, constipation, fever, or chills. No known allergies to medications.  Past Medical History  Diagnosis Date  . Dental abscess   . Asthma    Past Surgical History  Procedure Laterality Date  . Tympanostomy tube placement     No family history on file. History  Substance Use Topics  . Smoking status: Former Games developermoker  . Smokeless tobacco: Never Used  . Alcohol Use: No   OB History    No data available     Review of Systems  Constitutional: Negative for fever and chills.  HENT: Positive for sore throat.   Respiratory: Negative for cough and shortness of breath.   Cardiovascular: Negative for chest pain.  Skin: Negative for rash.  Neurological: Positive for headaches.  Psychiatric/Behavioral: Negative for confusion.  All other systems reviewed and are negative.     Allergies  Coconut flavor and Latex  Home Medications   Prior to Admission medications   Not on File   Triage Vitals: BP 135/86 mmHg  Pulse 96  Temp(Src) 98.1 F (36.7 C)  Resp 20  Ht 5\' 8"  (1.727 m)  Wt 282 lb (127.914 kg)  BMI 42.89 kg/m2  SpO2 98%   LMP 09/13/2014   Physical Exam  Constitutional: She is oriented to person, place, and time. She appears well-developed and well-nourished.  HENT:  Head: Normocephalic and atraumatic.  Right Ear: External ear normal.  Left Ear: External ear normal.  Mouth/Throat: No oropharyngeal exudate, posterior oropharyngeal edema or posterior oropharyngeal erythema.  Eyes: Conjunctivae and EOM are normal. Pupils are equal, round, and reactive to light.  Neck: Normal range of motion. Neck supple.  Cardiovascular: Normal rate, regular rhythm, normal heart sounds and intact distal pulses.   Pulmonary/Chest: Effort normal and breath sounds normal.  Abdominal: Soft. Bowel sounds are normal. There is no tenderness.  Musculoskeletal: Normal range of motion.  Neurological: She is alert and oriented to person, place, and time.  Skin: Skin is warm and dry.  Vitals reviewed.   ED Course  Procedures (including critical care time)  DIAGNOSTIC STUDIES: Oxygen Saturation is 98% on RA, Normal by my interpretation.    COORDINATION OF CARE: 12:41 AM- Will order urinalysis and urine preg. Discussed treatment plan with pt at bedside and pt agreed to plan.     Labs Review Labs Reviewed  URINALYSIS, ROUTINE W REFLEX MICROSCOPIC  POC URINE PREG, ED    Imaging Review No results found.   EKG Interpretation None      MDM   Final diagnoses:  Allergic rhinitis, unspecified allergic rhinitis type  34 y.o. female with pertinent PMH of asthma presents with itchy throat, watery eyes, runny nose consistent with allergic rhinitis.  Exam reassuring.  DC home in stable condition.    I have reviewed all laboratory and imaging studies if ordered as above  1. Allergic rhinitis, unspecified allergic rhinitis type           Megan MoMatthew Ishana Blades, MD 11/14/14 [phone]

## 2014-11-14 NOTE — Discharge Instructions (Signed)

## 2015-07-05 ENCOUNTER — Emergency Department (HOSPITAL_COMMUNITY): Payer: Medicaid Other

## 2015-07-05 ENCOUNTER — Encounter (HOSPITAL_COMMUNITY): Payer: Self-pay | Admitting: Emergency Medicine

## 2015-07-05 DIAGNOSIS — Z87891 Personal history of nicotine dependence: Secondary | ICD-10-CM | POA: Insufficient documentation

## 2015-07-05 DIAGNOSIS — Z3202 Encounter for pregnancy test, result negative: Secondary | ICD-10-CM | POA: Insufficient documentation

## 2015-07-05 DIAGNOSIS — J45909 Unspecified asthma, uncomplicated: Secondary | ICD-10-CM | POA: Insufficient documentation

## 2015-07-05 DIAGNOSIS — Z91040 Latex allergy status: Secondary | ICD-10-CM | POA: Insufficient documentation

## 2015-07-05 DIAGNOSIS — Z8719 Personal history of other diseases of the digestive system: Secondary | ICD-10-CM | POA: Diagnosis not present

## 2015-07-05 DIAGNOSIS — R079 Chest pain, unspecified: Secondary | ICD-10-CM | POA: Diagnosis present

## 2015-07-05 DIAGNOSIS — R0789 Other chest pain: Secondary | ICD-10-CM | POA: Insufficient documentation

## 2015-07-05 LAB — CBC
HEMATOCRIT: 40.5 % (ref 36.0–46.0)
HEMOGLOBIN: 12.9 g/dL (ref 12.0–15.0)
MCH: 27.8 pg (ref 26.0–34.0)
MCHC: 31.9 g/dL (ref 30.0–36.0)
MCV: 87.3 fL (ref 78.0–100.0)
Platelets: 222 10*3/uL (ref 150–400)
RBC: 4.64 MIL/uL (ref 3.87–5.11)
RDW: 13.9 % (ref 11.5–15.5)
WBC: 9.3 10*3/uL (ref 4.0–10.5)

## 2015-07-05 LAB — I-STAT TROPONIN, ED: TROPONIN I, POC: 0 ng/mL (ref 0.00–0.08)

## 2015-07-05 LAB — BASIC METABOLIC PANEL
Anion gap: 10 (ref 5–15)
BUN: 11 mg/dL (ref 6–20)
CALCIUM: 9.3 mg/dL (ref 8.9–10.3)
CO2: 27 mmol/L (ref 22–32)
CREATININE: 0.9 mg/dL (ref 0.44–1.00)
Chloride: 104 mmol/L (ref 101–111)
GFR calc Af Amer: >60 mL/min (ref >60)
GFR calc non Af Amer: >60 mL/min (ref >60)
GLUCOSE: 83 mg/dL (ref 65–99)
Potassium: 4.3 mmol/L (ref 3.5–5.1)
Sodium: 141 mmol/L (ref 135–145)

## 2015-07-05 LAB — I-STAT BETA HCG BLOOD, ED (MC, WL, AP ONLY)

## 2015-07-05 NOTE — ED Notes (Addendum)
Pt from home for eval of substernal cp that started last night, states pain gets worse with movement and denies any radiation. Pt also denies any other cardiac symptoms. nad ntoed.   Pt also asking for pregnancy test. States lmp was November 2016.

## 2015-07-06 ENCOUNTER — Emergency Department (HOSPITAL_COMMUNITY)
Admission: EM | Admit: 2015-07-06 | Discharge: 2015-07-06 | Disposition: A | Discharge status: Home / Self Care | Payer: Medicaid Other | Attending: Emergency Medicine | Admitting: Emergency Medicine

## 2015-07-06 DIAGNOSIS — R079 Chest pain, unspecified: Secondary | ICD-10-CM

## 2015-07-06 MED ORDER — KETOROLAC TROMETHAMINE 30 MG/ML IJ SOLN
30.0000 mg | Freq: Once | INTRAMUSCULAR | Status: DC
Start: 2015-07-06 — End: 2015-07-06

## 2015-07-06 NOTE — ED Notes (Signed)
Pt left with receiving medications, discharge paperwork, or signing e signature. Pt did not inform staff that they were leaving

## 2015-07-06 NOTE — ED Provider Notes (Signed)
CSN: [id number]     Arrival date & time 07/05/15  2220 History  By signing my name below, I, Jarvis Morgan, attest that this documentation has been prepared under the direction and in the presence of No att. providers found. Electronically Signed: Jarvis Morgan, ED Scribe. 07/07/2015. 3:25 PM.    Chief Complaint  Patient presents with  . Chest Pain   The history is provided by the patient. No language interpreter was used.    HPI Comments: Megan Valenzuela is a 34 y.o. female with no PMHx who presents to the Emergency Department from home complaining of intermittent, moderate, non radiating, substernal chest pain that began last night. She states the pain is exacerbated with movement. She has not taken any medications prior to arrival. Pt is not currently on any birth control medication. She denies any recent travel or immobilization. Pt denies any h/o cardiac issues or family h/o cardiac issues. She notes she frequently eats a lot of spicy foods and is unsure if that is contributory to the chest pain. Pt is a former smoker. She does not have a PCP. She denies any SOB, diaphoresis, cough, fever, nausea, vomiting, leg swelling, or other associated symptoms.    Past Medical History  Diagnosis Date  . Dental abscess   . Asthma    Past Surgical History  Procedure Laterality Date  . Tympanostomy tube placement     No family history on file. Social History  Substance Use Topics  . Smoking status: Former Games developer  . Smokeless tobacco: Never Used  . Alcohol Use: No   OB History    No data available     Review of Systems  Constitutional: Negative for fever, chills and diaphoresis.  HENT: Negative for congestion, postnasal drip, rhinorrhea and sinus pressure.   Eyes: Negative for pain and redness.  Respiratory: Negative for cough, chest tightness and shortness of breath.   Cardiovascular: Positive for chest pain. Negative for leg swelling.  Gastrointestinal: Negative for nausea,  vomiting, abdominal pain and diarrhea.  Genitourinary: Negative for dysuria, urgency and frequency.  Musculoskeletal: Negative for myalgias, back pain and neck pain.  Skin: Negative for rash.  Neurological: Negative for dizziness, seizures, weakness and numbness.  Hematological: Does not bruise/bleed easily.      Allergies  Coconut flavor and Latex  Home Medications   Prior to Admission medications   Not on File   Triage Vitals: BP 136/86 mmHg  Pulse 87  Temp(Src) 98.2 F (36.8 C) (Oral)  Resp 16  Ht  (1.727 m)  Wt 260 lb (117.935 kg)  BMI 39.54 kg/m2  SpO2 98%  LMP 06/05/2015  Physical Exam  Constitutional: She is oriented to person, place, and time. She appears well-developed and well-nourished. No distress.  HENT:  Head: Normocephalic and atraumatic.  Eyes: Conjunctivae and EOM are normal.  Neck: Neck supple. No tracheal deviation present.  Cardiovascular: Normal rate, regular rhythm and normal heart sounds.   Pulmonary/Chest: Effort normal and breath sounds normal. No respiratory distress. She exhibits tenderness (reproduceable chest tenderness over left chest wall).  Abdominal: Soft. She exhibits no distension. There is no tenderness. There is no guarding.  Musculoskeletal: Normal range of motion.  Neurological: She is alert and oriented to person, place, and time.  Skin: Skin is warm and dry.  Psychiatric: She has a normal mood and affect. Her behavior is normal.  Nursing note and vitals reviewed.   ED Course  Procedures (including critical care time)  DIAGNOSTIC STUDIES: Oxygen Saturation  is 98% on RA, normal by my interpretation.    COORDINATION OF CARE:  10:29 PM- Will order BMP, CBC, I-stat troponin, beta hCG, CXR and 12 lead EKG.  Pt advised of plan for treatment and pt agrees.   Labs Review Labs Reviewed  BASIC METABOLIC PANEL  CBC  I-STAT TROPOININ, ED  I-STAT BETA HCG BLOOD, ED ([facility], [facility], [facility] ONLY)    Imaging Review Dg Chest 2  View  07/06/2015  CLINICAL DATA:  Acute onset of central chest discomfort. Initial encounter. EXAM: CHEST  2 VIEW COMPARISON:  Chest radiograph performed 07/17/2013 FINDINGS: The lungs are well-aerated. Mild vascular congestion is noted. There is no evidence of focal opacification, pleural effusion or pneumothorax. The heart is normal in size; the mediastinal contour is within normal limits. No acute osseous abnormalities are seen. IMPRESSION: Mild vascular congestion noted.  Lungs otherwise clear. Electronically Signed   By: Roanna RaiderJeffery  Chang M.D.   On: 07/06/2015 00:08   I have personally reviewed and evaluated these images and lab results as part of my medical decision-making.   EKG Interpretation   Date/Time:  Wednesday July 05 2015 22:20:31 EST Ventricular Rate:  86 PR Interval:  126 QRS Duration: 90 QT Interval:  382 QTC Calculation: 457 R Axis:   38 Text Interpretation:  Normal sinus rhythm Normal ECG No significant change  since last tracing Confirmed by NGUYEN, EMILY ([id number]) on 07/06/2015  3:12:29 AM      MDM  Patient seen and evaluated in stable condition.  Patient with reproducible chest tenderness.  Troponin, EKG unremarkable.  Wells low risk and PERC negative. HeartScore 1.  Patient well appearing.  PAtient given Toradol for symptom control and discharged in stable condition with instruction to follow up outpatient and with strict return precautions. Final diagnoses:  Chest pain, unspecified chest pain type    1. Chest pain, likely chest wall I personally performed the services described in this documentation, which was scribed in my presence. The recorded information has been reviewed and is accurate.    Leta BaptistEmily Roe Nguyen, MD 07/07/15 [phone]

## 2015-07-06 NOTE — Discharge Instructions (Signed)
You were seen today for your chest pain.  At this time this does not appear to be life threatening and appears to most likely be coming from the muscles and bones in your chest wall.  Take Motrin to help control your symptoms.  Return with sudden shortness of breath or sudden sever chest pain.  Follow up with a primary care physician outpatient.  Nonspecific Chest Pain  Chest pain can be caused by many different conditions. There is always a chance that your pain could be related to something serious, such as a heart attack or a blood clot in your lungs. Chest pain can also be caused by conditions that are not life-threatening. If you have chest pain, it is very important to follow up with your health care provider. CAUSES  Chest pain can be caused by:  Heartburn.  Pneumonia or bronchitis.  Anxiety or stress.  Inflammation around your heart (pericarditis) or lung (pleuritis or pleurisy).  A blood clot in your lung.  A collapsed lung (pneumothorax). It can develop suddenly on its own (spontaneous pneumothorax) or from trauma to the chest.  Shingles infection (varicella-zoster virus).  Heart attack.  Damage to the bones, muscles, and cartilage that make up your chest wall. This can include:  Bruised bones due to injury.  Strained muscles or cartilage due to frequent or repeated coughing or overwork.  Fracture to one or more ribs.  Sore cartilage due to inflammation (costochondritis). RISK FACTORS  Risk factors for chest pain may include:  Activities that increase your risk for trauma or injury to your chest.  Respiratory infections or conditions that cause frequent coughing.  Medical conditions or overeating that can cause heartburn.  Heart disease or family history of heart disease.  Conditions or health behaviors that increase your risk of developing a blood clot.  Having had chicken pox (varicella zoster). SIGNS AND SYMPTOMS Chest pain can feel like:  Burning or  tingling on the surface of your chest or deep in your chest.  Crushing, pressure, aching, or squeezing pain.  Dull or sharp pain that is worse when you move, cough, or take a deep breath.  Pain that is also felt in your back, neck, shoulder, or arm, or pain that spreads to any of these areas. Your chest pain may come and go, or it may stay constant. DIAGNOSIS Lab tests or other studies may be needed to find the cause of your pain. Your health care provider may have you take a test called an ambulatory ECG (electrocardiogram). An ECG records your heartbeat patterns at the time the test is performed. You may also have other tests, such as:  Transthoracic echocardiogram (TTE). During echocardiography, sound waves are used to create a picture of all of the heart structures and to look at how blood flows through your heart.  Transesophageal echocardiogram (TEE).This is a more advanced imaging test that obtains images from inside your body. It allows your health care provider to see your heart in finer detail.  Cardiac monitoring. This allows your health care provider to monitor your heart rate and rhythm in real time.  Holter monitor. This is a portable device that records your heartbeat and can help to diagnose abnormal heartbeats. It allows your health care provider to track your heart activity for several days, if needed.  Stress tests. These can be done through exercise or by taking medicine that makes your heart beat more quickly.  Blood tests.  Imaging tests. TREATMENT  Your treatment depends on what  is causing your chest pain. Treatment may include:  Medicines. These may include:  Acid blockers for heartburn.  Anti-inflammatory medicine.  Pain medicine for inflammatory conditions.  Antibiotic medicine, if an infection is present.  Medicines to dissolve blood clots.  Medicines to treat coronary artery disease.  Supportive care for conditions that do not require medicines.  This may include:  Resting.  Applying heat or cold packs to injured areas.  Limiting activities until pain decreases. HOME CARE INSTRUCTIONS  If you were prescribed an antibiotic medicine, finish it all even if you start to feel better.  Avoid any activities that bring on chest pain.  Do not use any tobacco products, including cigarettes, chewing tobacco, or electronic cigarettes. If you need help quitting, ask your health care provider.  Do not drink alcohol.  Take medicines only as directed by your health care provider.  Keep all follow-up visits as directed by your health care provider. This is important. This includes any further testing if your chest pain does not go away.  If heartburn is the cause for your chest pain, you may be told to keep your head raised (elevated) while sleeping. This reduces the chance that acid will go from your stomach into your esophagus.  Make lifestyle changes as directed by your health care provider. These may include:  Getting regular exercise. Ask your health care provider to suggest some activities that are safe for you.  Eating a heart-healthy diet. A registered dietitian can help you to learn healthy eating options.  Maintaining a healthy weight.  Managing diabetes, if necessary.  Reducing stress. SEEK MEDICAL CARE IF:  Your chest pain does not go away after treatment.  You have a rash with blisters on your chest.  You have a fever. SEEK IMMEDIATE MEDICAL CARE IF:   Your chest pain is worse.  You have an increasing cough, or you cough up blood.  You have severe abdominal pain.  You have severe weakness.  You faint.  You have chills.  You have sudden, unexplained chest discomfort.  You have sudden, unexplained discomfort in your arms, back, neck, or jaw.  You have shortness of breath at any time.  You suddenly start to sweat, or your skin gets clammy.  You feel nauseous or you vomit.  You suddenly feel  light-headed or dizzy.  Your heart begins to beat quickly, or it feels like it is skipping beats. These symptoms may represent a serious problem that is an emergency. Do not wait to see if the symptoms will go away. Get medical help right away. Call your local emergency services (911 in the [place] Do not drive yourself to the hospital.   This information is not intended to replace advice given to you by your health care provider. Make sure you discuss any questions you have with your health care provider.   Document Released: 04/03/2005 Document Revised: 07/15/2014 Document Reviewed: 01/28/2014 Elsevier Interactive Patient Education [employer]

## 2015-07-19 ENCOUNTER — Encounter (HOSPITAL_COMMUNITY): Payer: Self-pay | Admitting: *Deleted

## 2015-07-19 ENCOUNTER — Emergency Department (HOSPITAL_COMMUNITY)
Admission: EM | Admit: 2015-07-19 | Discharge: 2015-07-19 | Disposition: A | Discharge status: Home / Self Care | Payer: Medicaid Other | Attending: Emergency Medicine | Admitting: Emergency Medicine

## 2015-07-19 DIAGNOSIS — Z87891 Personal history of nicotine dependence: Secondary | ICD-10-CM | POA: Diagnosis not present

## 2015-07-19 DIAGNOSIS — Z3202 Encounter for pregnancy test, result negative: Secondary | ICD-10-CM | POA: Diagnosis not present

## 2015-07-19 DIAGNOSIS — J45909 Unspecified asthma, uncomplicated: Secondary | ICD-10-CM | POA: Diagnosis not present

## 2015-07-19 DIAGNOSIS — Z91040 Latex allergy status: Secondary | ICD-10-CM | POA: Diagnosis not present

## 2015-07-19 DIAGNOSIS — R112 Nausea with vomiting, unspecified: Secondary | ICD-10-CM | POA: Diagnosis not present

## 2015-07-19 DIAGNOSIS — Z8719 Personal history of other diseases of the digestive system: Secondary | ICD-10-CM | POA: Diagnosis not present

## 2015-07-19 LAB — COMPREHENSIVE METABOLIC PANEL
ALK PHOS: 95 U/L (ref 38–126)
ALT: 22 U/L (ref 14–54)
AST: 24 U/L (ref 15–41)
Albumin: 3.8 g/dL (ref 3.5–5.0)
Anion gap: 10 (ref 5–15)
BUN: 12 mg/dL (ref 6–20)
CALCIUM: 9.3 mg/dL (ref 8.9–10.3)
CHLORIDE: 103 mmol/L (ref 101–111)
CO2: 24 mmol/L (ref 22–32)
CREATININE: 0.88 mg/dL (ref 0.44–1.00)
GFR calc Af Amer: >60 mL/min (ref >60)
Glucose, Bld: 125 mg/dL — ABNORMAL HIGH (ref 65–99)
Potassium: 4.2 mmol/L (ref 3.5–5.1)
Sodium: 137 mmol/L (ref 135–145)
Total Bilirubin: 0.6 mg/dL (ref 0.3–1.2)
Total Protein: 8.2 g/dL — ABNORMAL HIGH (ref 6.5–8.1)

## 2015-07-19 LAB — CBC
HCT: 43.9 % (ref 36.0–46.0)
Hemoglobin: 14.2 g/dL (ref 12.0–15.0)
MCH: 27.6 pg (ref 26.0–34.0)
MCHC: 32.3 g/dL (ref 30.0–36.0)
MCV: 85.2 fL (ref 78.0–100.0)
PLATELETS: 241 10*3/uL (ref 150–400)
RBC: 5.15 MIL/uL — ABNORMAL HIGH (ref 3.87–5.11)
RDW: 13.5 % (ref 11.5–15.5)
WBC: 10.1 10*3/uL (ref 4.0–10.5)

## 2015-07-19 LAB — I-STAT BETA HCG BLOOD, ED (MC, WL, AP ONLY)

## 2015-07-19 LAB — LIPASE, BLOOD: LIPASE: 32 U/L (ref 11–51)

## 2015-07-19 MED ORDER — PROMETHAZINE HCL 25 MG PO TABS: 25.0000 mg | ORAL_TABLET | Freq: Four times a day (QID) | ORAL | Status: DC | PRN

## 2015-07-19 MED ORDER — ONDANSETRON 8 MG PO TBDP: 8.0000 mg | ORAL_TABLET | Freq: Three times a day (TID) | ORAL | Status: DC | PRN

## 2015-07-19 MED ORDER — ONDANSETRON 4 MG PO TBDP
8.0000 mg | ORAL_TABLET | Freq: Once | ORAL | Status: AC
  Administered 2015-07-19: 8 mg via ORAL
  Filled 2015-07-19: qty 2

## 2015-07-19 NOTE — ED Notes (Signed)
Pt verbalized understanding of d/c instructions, prescriptions, and follow-up care. No further questions/concerns, VSS, assisted to lobby in wheelchair.  

## 2015-07-19 NOTE — ED Notes (Signed)
Pt reports N/V/D since 3am today. Pt states that she has generalized abdominal "burning."

## 2015-07-19 NOTE — ED Provider Notes (Signed)
CSN: [id number]     Arrival date & time 07/19/15  4782 History   First MD Initiated Contact with Patient 07/19/15 [phone]     Chief Complaint  Patient presents with  . Emesis  . Diarrhea      HPI Patient presents to the emergency department with complaints of nausea vomiting which began today around 3 AM.  She states she had one formed stool.  Denies diarrhea.  She reports a burning and a rumbling sensation in her abdomen.  She denies abdominal pain or tenderness.  She denies fevers and chills.  No recent illness.  No recent sick contacts.  Symptoms are moderate in severity.  Nothing worsens or improves her symptoms.  Denies hematemesis.  No melena or hematochezia.   Past Medical History  Diagnosis Date  . Dental abscess   . Asthma    Past Surgical History  Procedure Laterality Date  . Tympanostomy tube placement     No family history on file. Social History  Substance Use Topics  . Smoking status: Former Games developer  . Smokeless tobacco: Never Used  . Alcohol Use: No   OB History    No data available     Review of Systems  All other systems reviewed and are negative.     Allergies  Coconut flavor and Latex  Home Medications   Prior to Admission medications   Medication Sig Start Date End Date Taking? Authorizing Provider  ondansetron (ZOFRAN ODT) 8 MG disintegrating tablet Take 1 tablet (8 mg total) by mouth every 8 (eight) hours as needed for nausea or vomiting. 07/19/15   Azalia Bilis, MD  promethazine (PHENERGAN) 25 MG tablet Take 1 tablet (25 mg total) by mouth every 6 (six) hours as needed for nausea or vomiting. 07/19/15   Azalia Bilis, MD   BP 101/74 mmHg  Pulse 93  Temp(Src) 97.6 F (36.4 C) (Oral)  Resp 16  SpO2 100%  LMP 06/05/2015 Physical Exam  Constitutional: She is oriented to person, place, and time. She appears well-developed and well-nourished. No distress.  HENT:  Head: Normocephalic and atraumatic.  Eyes: EOM are normal.  Neck: Normal range of  motion.  Cardiovascular: Normal rate, regular rhythm and normal heart sounds.   Pulmonary/Chest: Effort normal and breath sounds normal.  Abdominal: Soft. She exhibits no distension. There is no tenderness.  Musculoskeletal: Normal range of motion.  Neurological: She is alert and oriented to person, place, and time.  Skin: Skin is warm and dry.  Psychiatric: She has a normal mood and affect. Judgment normal.  Nursing note and vitals reviewed.   ED Course  Procedures (including critical care time) Labs Review Labs Reviewed  COMPREHENSIVE METABOLIC PANEL - Abnormal; Notable for the following:    Glucose, Bld 125 (*)    Total Protein 8.2 (*)    All other components within normal limits  CBC - Abnormal; Notable for the following:    RBC 5.15 (*)    All other components within normal limits  LIPASE, BLOOD  I-STAT BETA HCG BLOOD, ED ([facility], [facility], [facility] ONLY)    Imaging Review No results found. I have personally reviewed and evaluated these images and lab results as part of my medical decision-making.   EKG Interpretation None      MDM   Final diagnoses:  Nausea and vomiting, vomiting of unspecified type    Well-appearing.  Tolerating fluids now.  Vital signs without abnormality.  Discharge home in good condition.  Likely viral process.  Repeat abdominal  exam is benign.  Home with Zofran and Phenergan when necessary nausea.  Instructions to orally hydrate herself.  Instructions to return to the emergency department for new or worsening symptoms.  Primary care follow-up.    Azalia BilisKevin Bayler Nehring, MD 07/19/15 [phone]

## 2015-07-19 NOTE — Discharge Instructions (Signed)

## 2015-12-28 ENCOUNTER — Emergency Department (HOSPITAL_COMMUNITY)
Admission: EM | Admit: 2015-12-28 | Discharge: 2015-12-28 | Disposition: A | Discharge status: Home / Self Care | Payer: No Typology Code available for payment source

## 2015-12-28 NOTE — ED Notes (Signed)
No answer at this time for triage.

## 2015-12-28 NOTE — ED Notes (Signed)
No response for triage at this time

## 2015-12-28 NOTE — ED Notes (Signed)
Patient called in main ED waiting area x3 with no response

## 2016-04-19 ENCOUNTER — Emergency Department (HOSPITAL_COMMUNITY): Payer: Medicaid Other

## 2016-04-19 ENCOUNTER — Encounter (HOSPITAL_COMMUNITY): Payer: Self-pay

## 2016-04-19 ENCOUNTER — Emergency Department (HOSPITAL_COMMUNITY)
Admission: EM | Admit: 2016-04-19 | Discharge: 2016-04-19 | Disposition: A | Discharge status: Home / Self Care | Payer: Medicaid Other | Attending: Emergency Medicine | Admitting: Emergency Medicine

## 2016-04-19 DIAGNOSIS — Z87891 Personal history of nicotine dependence: Secondary | ICD-10-CM | POA: Diagnosis not present

## 2016-04-19 DIAGNOSIS — J45909 Unspecified asthma, uncomplicated: Secondary | ICD-10-CM | POA: Insufficient documentation

## 2016-04-19 DIAGNOSIS — Z91040 Latex allergy status: Secondary | ICD-10-CM | POA: Diagnosis not present

## 2016-04-19 DIAGNOSIS — R0789 Other chest pain: Secondary | ICD-10-CM | POA: Insufficient documentation

## 2016-04-19 LAB — BASIC METABOLIC PANEL
ANION GAP: 9 (ref 5–15)
BUN: 6 mg/dL (ref 6–20)
CALCIUM: 9.6 mg/dL (ref 8.9–10.3)
CO2: 26 mmol/L (ref 22–32)
Chloride: 102 mmol/L (ref 101–111)
Creatinine, Ser: 0.97 mg/dL (ref 0.44–1.00)
Glucose, Bld: 107 mg/dL — ABNORMAL HIGH (ref 65–99)
POTASSIUM: 4 mmol/L (ref 3.5–5.1)
Sodium: 137 mmol/L (ref 135–145)

## 2016-04-19 LAB — CBC
HEMATOCRIT: 41.7 % (ref 36.0–46.0)
HEMOGLOBIN: 13.5 g/dL (ref 12.0–15.0)
MCH: 27.9 pg (ref 26.0–34.0)
MCHC: 32.4 g/dL (ref 30.0–36.0)
MCV: 86.2 fL (ref 78.0–100.0)
Platelets: 218 10*3/uL (ref 150–400)
RBC: 4.84 MIL/uL (ref 3.87–5.11)
RDW: 13.7 % (ref 11.5–15.5)
WBC: 7.5 10*3/uL (ref 4.0–10.5)

## 2016-04-19 LAB — I-STAT TROPONIN, ED: TROPONIN I, POC: 0 ng/mL (ref 0.00–0.08)

## 2016-04-19 NOTE — ED Triage Notes (Signed)
Per Pt, Pt is coming from home with complaints of mid-center chest pain that started about a month ago. Reports aching that radiates into the neck. States, "I started jogging about a month ago and when I turn side to side it hurts"

## 2016-04-19 NOTE — ED Notes (Signed)
Pt not in room when RN went to see pt. Pt seen walking out of ER towards elevators. No staff informed by pt that she was leaving. EDP aware.

## 2016-04-19 NOTE — ED Provider Notes (Signed)
[facility]-EMERGENCY DEPT Provider Note   CSN: [id number] Arrival date & time: 04/19/16  [id number]     History   Chief Complaint Chief Complaint  Patient presents with  . Chest Pain    HPI Megan Valenzuela is a 35 y.o. female.  35 year old African-American female past medical history significant for asthma presents to the ED today with chest wall tenderness. Patient states the pain started approximate one month ago when she started jogging for the first time to loose weight. Patient describes the pain as "muscle cramping". States that moving makes the pain worse. The pain is localized to the left lateral wall. The pain does not radiate. The pain is not associated with exertion. It is not pleuritic in nature. States the pain starts after she finishes jogging and is intermittent. Patient denies any shortness of breath with the episode. She denies any nausea or emesis. Denies any diaphoresis. Patient states that when she leans forward the pain is worse and feels like a burning sensation in her throat. Patient states that when she sits up the pain resolves. States that she is able to reproduce the chest pain with palpation. Patient states that she "looked on Web M.D. today and thought she was having an infection around her heart." Patient has tried Tylenol and ibuprofen with relief. States that the pain is worse at night when she turned side-to-side in the bed. Patient has history of chest discomfort. She was started on PPI after being seen in ED for chest pain in December 2016. Patient states that she is not taking any medications. Patient with history of asthma but states she is not using any inhalers. She denies any chest pain at this time. Patient states she has not had cp since yesterday morning. Patient denies any oral contraceptive use, recent hospitalizations/surgeries, prolonged immobilizations, history of DVT, unilateral leg swelling, tobacco use. Pt denies any h/o cardiac issues or family h/o  cardiac issues. Patient denies any fever, chills, ha, vision changes, cough, sob, abd pain, nausea, emesis, urinary symptoms, change in bowel habits, numbness/tingling.      Past Medical History:  Diagnosis Date  . Asthma   . Dental abscess     There are no active problems to display for this patient.   Past Surgical History:  Procedure Laterality Date  . TYMPANOSTOMY TUBE PLACEMENT      OB History    No data available       Home Medications    Prior to Admission medications   Medication Sig Start Date End Date Taking? Authorizing Provider  ondansetron (ZOFRAN ODT) 8 MG disintegrating tablet Take 1 tablet (8 mg total) by mouth every 8 (eight) hours as needed for nausea or vomiting. Patient not taking: Reported on 04/19/2016 07/19/15   Azalia BilisKevin Campos, MD  promethazine (PHENERGAN) 25 MG tablet Take 1 tablet (25 mg total) by mouth every 6 (six) hours as needed for nausea or vomiting. Patient not taking: Reported on 04/19/2016 07/19/15   Azalia BilisKevin Campos, MD    Family History No family history on file.  Social History Social History  Substance Use Topics  . Smoking status: Former Games developermoker  . Smokeless tobacco: Never Used  . Alcohol use No     Allergies   Latex   Review of Systems Review of Systems  Constitutional: Negative for chills and fever.  HENT: Negative for congestion, ear pain, rhinorrhea and sore throat.   Eyes: Negative for pain and visual disturbance.  Respiratory: Negative for cough and shortness of breath.  Cardiovascular: Positive for chest pain. Negative for palpitations and leg swelling.  Gastrointestinal: Negative for abdominal pain, diarrhea, nausea and vomiting.  Genitourinary: Negative for dysuria, flank pain, frequency, hematuria and urgency.  Musculoskeletal: Negative for arthralgias and back pain.  Skin: Negative for color change and rash.  Neurological: Negative for dizziness, syncope, weakness, light-headedness, numbness and headaches.  All  other systems reviewed and are negative.    Physical Exam Updated Vital Signs BP 103/61   Pulse 71   Temp 98.4 F (36.9 C) (Oral)   Resp 18   Ht 5\' 8"  (1.727 m)   Wt 117.9 kg   LMP 04/12/2016   SpO2 99%   BMI 39.53 kg/m   Physical Exam  Constitutional: She appears well-developed and well-nourished. No distress.  Obese female  HENT:  Head: Normocephalic and atraumatic.  Mouth/Throat: Oropharynx is clear and moist.  Eyes: Conjunctivae are normal. Right eye exhibits no discharge. Left eye exhibits no discharge. No scleral icterus.  Neck: Normal range of motion. Neck supple. No thyromegaly present.  Cardiovascular: Normal rate, regular rhythm, normal heart sounds and intact distal pulses.  Exam reveals no gallop and no friction rub.   No murmur heard. Pulses:      Radial pulses are 2+ on the right side, and 2+ on the left side.       Dorsalis pedis pulses are 2+ on the right side, and 2+ on the left side.  Patient without chest pain  Pulmonary/Chest: Effort normal and breath sounds normal. No respiratory distress. She has no wheezes. She exhibits no tenderness.  Abdominal: Soft. Bowel sounds are normal. She exhibits no distension. There is no tenderness. There is no rebound and no guarding.  Musculoskeletal: Normal range of motion.  No lower extremity edema. No calf tenderness, no ecchymosis or bruising noticed.  Lymphadenopathy:    She has no cervical adenopathy.  Neurological: She is alert.  Skin: Skin is warm and dry. Capillary refill takes less than 2 seconds.  Nursing note and vitals reviewed.    ED Treatments / Results  Labs (all labs ordered are listed, but only abnormal results are displayed) Labs Reviewed  BASIC METABOLIC PANEL - Abnormal; Notable for the following:       Result Value   Glucose, Bld 107 (*)    All other components within normal limits  CBC  I-STAT TROPOININ, ED    EKG  EKG Interpretation None       Radiology Dg Chest 2  View  Result Date: 04/19/2016 CLINICAL DATA:  Mid chest pain and pressure since beginning to job 2 months ago. History of asthma, former smoker. EXAM: CHEST  2 VIEW COMPARISON:  PA and lateral chest x-ray of July 05, 2015 FINDINGS: The lungs are adequately inflated. There is no focal infiltrate. There is no pleural effusion. The heart and pulmonary vascularity are normal. The mediastinum is normal in width. The bony thorax exhibits no acute abnormality. IMPRESSION: Mild interstitial changes consistent with the history of smoking and reactive airway disease. No pneumonia, CHF, nor other acute cardiopulmonary abnormality. Electronically Signed   By: David  [place] M.D.   On: 04/19/2016 10:07    Procedures Procedures (including critical care time)  Medications Ordered in ED Medications - No data to display   Initial Impression / Assessment and Plan / ED Course  I have reviewed the triage vital signs and the nursing notes.  Pertinent labs & imaging results that were available during my care of the patient were reviewed by  me and considered in my medical decision making (see chart for details).  Clinical Course  Patient is to be discharged with recommendation to follow up with PCP in regards to today's hospital visit. Chest pain is not likely of cardiac or pulmonary etiology d/t presentation, low risk wells and perc negative, VSS, no tracheal deviation, no JVD or new murmur, RRR, breath sounds equal bilaterally, EKG without acute abnormalities and was reviewed by Dr. Clydene Pugh, negative troponin, and negative CXR. Heart pathway score of 1. Patient is without chest pain in the past 24 hours. Chest pain is likely msk related to starting exercise recently. She may also have a degree of acid reflux. Pt has been advised to start a PPI and use conservative therapies including tylenol, ibuprofen, and heating pad and return to the ED is CP becomes exertional, associated with diaphoresis or nausea, radiates to  left jaw/arm, worsens or becomes concerning in any way. Pt appears reliable for follow up and is agreeable to discharge. Given Engineer, building services for pcp. Patient is hemodynamically stable in in NAD. Discharged home with strict return precautions. Case discussed with Dr. Clydene Pugh who is agreeable to plan.   Final Clinical Impressions(s) / ED Diagnoses   Final diagnoses:  Chest wall pain    New Prescriptions Discharge Medication List as of 04/19/2016 12:03 PM       Rise Mu, PA-C 04/19/16 1732    Lyndal Pulley, MD 04/19/16 1820

## 2016-06-20 ENCOUNTER — Encounter (HOSPITAL_COMMUNITY): Payer: Self-pay | Admitting: Emergency Medicine

## 2016-06-20 ENCOUNTER — Emergency Department (HOSPITAL_COMMUNITY)
Admission: EM | Admit: 2016-06-20 | Discharge: 2016-06-20 | Disposition: A | Discharge status: Home / Self Care | Payer: Medicaid Other | Attending: Emergency Medicine | Admitting: Emergency Medicine

## 2016-06-20 DIAGNOSIS — Z87891 Personal history of nicotine dependence: Secondary | ICD-10-CM | POA: Insufficient documentation

## 2016-06-20 DIAGNOSIS — Z91040 Latex allergy status: Secondary | ICD-10-CM | POA: Insufficient documentation

## 2016-06-20 DIAGNOSIS — J45909 Unspecified asthma, uncomplicated: Secondary | ICD-10-CM | POA: Insufficient documentation

## 2016-06-20 DIAGNOSIS — J069 Acute upper respiratory infection, unspecified: Secondary | ICD-10-CM | POA: Insufficient documentation

## 2016-06-20 DIAGNOSIS — J029 Acute pharyngitis, unspecified: Secondary | ICD-10-CM | POA: Diagnosis present

## 2016-06-20 LAB — RAPID STREP SCREEN (MED CTR MEBANE ONLY): Streptococcus, Group A Screen (Direct): NEGATIVE

## 2016-06-20 NOTE — ED Triage Notes (Signed)
Pt states she has had a headache and sore throat now for the past week. Attempted Thera-Flu with no relief from the sore throat, head has subsided. Pt does not have PCP and wants to make sure she does not have strep throat.

## 2016-06-20 NOTE — ED Provider Notes (Addendum)
[facility]PT Provider Note   CSN: [id number] Arrival date & time: 06/20/16  1910     History   Chief Complaint Chief Complaint  Patient presents with  . Sore Throat    HPI Megan Valenzuela is a 35 y.o. female.  The history is provided by the patient.  URI   This is a new problem. Episode onset: 1 week. The problem has not changed since onset.Maximum temperature: subjective. Associated symptoms include congestion, headaches, rhinorrhea, sore throat and cough. Pertinent negatives include no chest pain, no abdominal pain, no diarrhea, no nausea, no vomiting, no dysuria, no ear pain and no rash. Treatments tried: thera flu. The treatment provided moderate relief.   Reports sick contacts with similar sxs.  Past Medical History:  Diagnosis Date  . Asthma   . Dental abscess     There are no active problems to display for this patient.   Past Surgical History:  Procedure Laterality Date  . TYMPANOSTOMY TUBE PLACEMENT      OB History    No data available       Home Medications    Prior to Admission medications   Medication Sig Start Date End Date Taking? Authorizing Provider  ondansetron (ZOFRAN ODT) 8 MG disintegrating tablet Take 1 tablet (8 mg total) by mouth every 8 (eight) hours as needed for nausea or vomiting. Patient not taking: Reported on 04/19/2016 07/19/15   Azalia BilisKevin Campos, MD  promethazine (PHENERGAN) 25 MG tablet Take 1 tablet (25 mg total) by mouth every 6 (six) hours as needed for nausea or vomiting. Patient not taking: Reported on 04/19/2016 07/19/15   Azalia BilisKevin Campos, MD    Family History No family history on file.  Social History Social History  Substance Use Topics  . Smoking status: Former Games developermoker  . Smokeless tobacco: Never Used  . Alcohol use No     Allergies   Latex   Review of Systems Review of Systems  Constitutional: Negative for chills and fever.  HENT: Positive for congestion, rhinorrhea and sore throat. Negative for ear  pain.   Eyes: Negative for pain and visual disturbance.  Respiratory: Positive for cough. Negative for shortness of breath.   Cardiovascular: Negative for chest pain and palpitations.  Gastrointestinal: Negative for abdominal pain, diarrhea, nausea and vomiting.  Genitourinary: Negative for dysuria and hematuria.  Musculoskeletal: Negative for arthralgias and back pain.  Skin: Negative for color change and rash.  Neurological: Positive for headaches. Negative for seizures and syncope.  All other systems reviewed and are negative.    Physical Exam Updated Vital Signs BP 138/95 (BP Location: Right Arm)   Pulse 86   Temp 98 F (36.7 C)   Resp 20   Ht 5\' 8"  (1.727 m)   Wt 260 lb (117.9 kg)   LMP 06/05/2016 (Approximate)   SpO2 96%   BMI 39.53 kg/m   Physical Exam  Constitutional: She is oriented to person, place, and time. She appears well-developed and well-nourished. No distress.  HENT:  Head: Normocephalic and atraumatic.  Right Ear: No mastoid tenderness. Tympanic membrane is not perforated, not erythematous and not bulging. No middle ear effusion.  Left Ear: No mastoid tenderness. Tympanic membrane is not perforated, not erythematous and not bulging.  No middle ear effusion.  Nose: Nose normal.  Mouth/Throat: Mucous membranes are normal. No oropharyngeal exudate, posterior oropharyngeal edema, posterior oropharyngeal erythema or tonsillar abscesses. Tonsils are 1+ on the right. Tonsils are 1+ on the left. No tonsillar exudate.  Post nasal  drip  Eyes: Conjunctivae and EOM are normal. Pupils are equal, round, and reactive to light. Right eye exhibits no discharge. Left eye exhibits no discharge. No scleral icterus.  Neck: Normal range of motion. Neck supple.  Cardiovascular: Normal rate and regular rhythm.  Exam reveals no gallop and no friction rub.   No murmur heard. Pulmonary/Chest: Effort normal and breath sounds normal. No stridor. No respiratory distress. She has no  rales.  Abdominal: Soft. She exhibits no distension. There is no tenderness.  Musculoskeletal: She exhibits no edema or tenderness.  Neurological: She is alert and oriented to person, place, and time.  Skin: Skin is warm and dry. No rash noted. She is not diaphoretic. No erythema.  Psychiatric: She has a normal mood and affect.  Vitals reviewed.    ED Treatments / Results  Labs (all labs ordered are listed, but only abnormal results are displayed) Labs Reviewed  RAPID STREP SCREEN (NOT AT [facility])  CULTURE, GROUP A STREP [facility])    EKG  EKG Interpretation None       Radiology No results found.  Procedures Procedures (including critical care time)  Medications Ordered in ED Medications - No data to display   Initial Impression / Assessment and Plan / ED Course  I have reviewed the triage vital signs and the nursing notes.  Pertinent labs & imaging results that were available during my care of the patient were reviewed by me and considered in my medical decision making (see chart for details).  Clinical Course     35 y.o. female presents with cough, rhinorrhea, sore throat and subjective fever for 7 days. adequate oral hydration. Rest of history as above.  Patient AFVSS and appears well. No signs of toxicity. No hypoxia, tachypnea or other signs of respiratory distress. No sign of clinical dehydration. Lung exam clear. Rest of exam as above.  Rapid strep negative.  Most consistent with viral upper respiratory infection.   No evidence suggestive of pharyngitis, AOM, PNA, or meningitis.   Chest x-ray not indicated at this time.  Discussed symptomatic treatment with the patient and they will follow closely with their PCP.    Final Clinical Impressions(s) / ED Diagnoses   Final diagnoses:  Upper respiratory tract infection, unspecified type   Disposition: Discharge  Condition: Good  I have discussed the results, Dx and Tx plan with the patient who expressed  understanding and agree(s) with the plan. Discharge instructions discussed at great length. The patient was given strict return precautions who verbalized understanding of the instructions. No further questions at time of discharge.    Current Discharge Medication List      Follow Up: primary care provider  Schedule an appointment as soon as possible for a visit  in 5-7 days, If symptoms do not improve or  worsen        Nira ConnPedro Eduardo Cardama, MD 06/20/16 2051

## 2016-06-20 NOTE — ED Notes (Signed)
Declined W/C at D/C and was escorted to lobby by RN. 

## 2016-06-23 LAB — CULTURE, GROUP A STREP (THRC)

## 2016-11-22 ENCOUNTER — Emergency Department (HOSPITAL_COMMUNITY)
Admission: EM | Admit: 2016-11-22 | Discharge: 2016-11-23 | Disposition: A | Discharge status: Home / Self Care | Payer: Medicaid Other | Attending: Emergency Medicine | Admitting: Emergency Medicine

## 2016-11-22 ENCOUNTER — Emergency Department (HOSPITAL_COMMUNITY): Payer: Medicaid Other

## 2016-11-22 ENCOUNTER — Encounter (HOSPITAL_COMMUNITY): Payer: Self-pay | Admitting: Nurse Practitioner

## 2016-11-22 DIAGNOSIS — Z91040 Latex allergy status: Secondary | ICD-10-CM | POA: Diagnosis not present

## 2016-11-22 DIAGNOSIS — R05 Cough: Secondary | ICD-10-CM | POA: Diagnosis present

## 2016-11-22 DIAGNOSIS — T7849XA Other allergy, initial encounter: Secondary | ICD-10-CM | POA: Diagnosis not present

## 2016-11-22 DIAGNOSIS — J45909 Unspecified asthma, uncomplicated: Secondary | ICD-10-CM | POA: Diagnosis not present

## 2016-11-22 DIAGNOSIS — Z9109 Other allergy status, other than to drugs and biological substances: Secondary | ICD-10-CM

## 2016-11-22 DIAGNOSIS — Z87891 Personal history of nicotine dependence: Secondary | ICD-10-CM | POA: Insufficient documentation

## 2016-11-22 NOTE — ED Notes (Signed)
Patient transported to X-ray 

## 2016-11-22 NOTE — ED Notes (Addendum)
Pt c/o productive cough with whitish sputum, weakness, congestion for about 2 weeks. Denies n/v.

## 2016-11-22 NOTE — ED Provider Notes (Signed)
[facility]-EMERGENCY DEPT Provider Note   CSN: [id number] Arrival date & time: 11/22/16  2129     History   Chief Complaint Chief Complaint  Patient presents with  . URI    HPI Megan Valenzuela is a 36 y.o. female.  HPI   Megan Valenzuela is a 36 y.o. female, with a history of Asthma, presenting to the ED with Dry cough, nasal congestion, sneezing, postnasal drip, itchy throat for the past several weeks. Has not tried any medications or therapies. Has continued to smoke cigarettes. Denies fever/chills, shortness of breath, chest pain, N/V/D, or any other complaints.   Past Medical History:  Diagnosis Date  . Asthma   . Dental abscess     There are no active problems to display for this patient.   Past Surgical History:  Procedure Laterality Date  . TYMPANOSTOMY TUBE PLACEMENT      OB History    No data available       Home Medications    Prior to Admission medications   Medication Sig Start Date End Date Taking? Authorizing Provider  ondansetron (ZOFRAN ODT) 8 MG disintegrating tablet Take 1 tablet (8 mg total) by mouth every 8 (eight) hours as needed for nausea or vomiting. Patient not taking: Reported on 04/19/2016 07/19/15   Azalia Bilis, MD  promethazine (PHENERGAN) 25 MG tablet Take 1 tablet (25 mg total) by mouth every 6 (six) hours as needed for nausea or vomiting. Patient not taking: Reported on 04/19/2016 07/19/15   Azalia Bilis, MD    Family History History reviewed. No pertinent family history.  Social History Social History  Substance Use Topics  . Smoking status: Former Games developer  . Smokeless tobacco: Never Used  . Alcohol use No     Allergies   Latex   Review of Systems Review of Systems  Constitutional: Negative for chills and fever.  HENT: Positive for congestion, postnasal drip, rhinorrhea and sneezing. Negative for trouble swallowing and voice change.   Eyes: Positive for itching.  Respiratory: Positive for cough. Negative for  shortness of breath.   Cardiovascular: Negative for chest pain.  Gastrointestinal: Negative for abdominal pain, diarrhea, nausea and vomiting.  All other systems reviewed and are negative.    Physical Exam Updated Vital Signs Pulse 85   Temp 98.1 F (36.7 C) (Oral)   Resp 18   Ht 5\' 8"  (1.727 m)   Wt 260 lb (117.9 kg)   LMP 10/23/2016 (Approximate)   SpO2 100%   BMI 39.53 kg/m   Physical Exam  Constitutional: She appears well-developed and well-nourished. No distress.  HENT:  Head: Normocephalic and atraumatic.  Nose: Nose normal.  Mouth/Throat: Oropharynx is clear and moist.  Eyes: Conjunctivae are normal.  Neck: Neck supple.  Cardiovascular: Normal rate, regular rhythm, normal heart sounds and intact distal pulses.   Pulmonary/Chest: Effort normal and breath sounds normal. No respiratory distress.  Abdominal: Soft. There is no tenderness. There is no guarding.  Musculoskeletal: She exhibits no edema.  Lymphadenopathy:    She has no cervical adenopathy.  Neurological: She is alert.  Skin: Skin is warm and dry. She is not diaphoretic.  Psychiatric: She has a normal mood and affect. Her behavior is normal.  Nursing note and vitals reviewed.    ED Treatments / Results  Labs (all labs ordered are listed, but only abnormal results are displayed) Labs Reviewed - No data to display  EKG  EKG Interpretation None       Radiology Dg Chest  2 View  Result Date: 11/22/2016 CLINICAL DATA:  Chest pain EXAM: CHEST  2 VIEW COMPARISON:  Chest radiograph 04/19/2016 FINDINGS: The heart size and mediastinal contours are within normal limits. Both lungs are clear. The visualized skeletal structures are unremarkable. IMPRESSION: No active cardiopulmonary disease. Electronically Signed   By: Deatra RobinsonKevin  Herman M.D.   On: 11/22/2016 23:03    Procedures Procedures (including critical care time)  Medications Ordered in ED Medications - No data to display   Initial Impression /  Assessment and Plan / ED Course  I have reviewed the triage vital signs and the nursing notes.  Pertinent labs & imaging results that were available during my care of the patient were reviewed by me and considered in my medical decision making (see chart for details).     Patient presents with symptoms of environmental allergies. No signs of active infection on exam. PCP follow-up and management recommended. The patient was given instructions for home care as well as return precautions. Patient voices understanding of these instructions, accepts the plan, and is comfortable with discharge.  Final Clinical Impressions(s) / ED Diagnoses   Final diagnoses:  Environmental allergies    New Prescriptions Discharge Medication List as of 11/22/2016 11:44 PM       Anselm PancoastJoy, Shawn C, PA-C 11/23/16 0015    Tegeler, Canary Brimhristopher J, MD 11/23/16 [phone]

## 2016-11-22 NOTE — Discharge Instructions (Signed)
Your symptoms are consistent with environmental allergies. Highly recommend quitting tobacco use.  Hand washing: Wash your hands throughout the day, but especially before and after touching the face, using the restroom, sneezing, coughing, or touching surfaces that have been coughed or sneezed upon. Hydration: Symptoms will be intensified and complicated by dehydration. Dehydration can also extend the duration of symptoms. Drink plenty of fluids and get plenty of rest. You should be drinking at least half a liter of water an hour to stay hydrated. Electrolyte drinks are also encouraged. You should be drinking enough fluids to make your urine light yellow, almost clear. If this is not the case, you are not drinking enough water. Please note that some of the treatments indicated below will not be effective if you are not adequately hydrated. Pain: Ibuprofen, Naproxen, or Tylenol for pain.  Congestion: Plain Mucinex may help relieve congestion. Saline sinus rinses and saline nasal sprays may also help relieve congestion.  Sore throat: Warm liquids or Chloraseptic spray may help soothe a sore throat. Gargle twice a day with a salt water solution made from a half teaspoon of salt in a cup of warm water.  May initiate a daily Claritin or Zyrtec. These medications are available over-the-counter. Follow up: Follow up with a primary care provider, as needed, for any future management of this issue.

## 2016-11-22 NOTE — ED Triage Notes (Signed)
Pt is c/o of multiple symptoms, postnasal drip, sinus discomfort, nasal drip and is concerned for a pneumonia. Requesting a CXR.

## 2017-02-22 ENCOUNTER — Encounter (HOSPITAL_COMMUNITY): Payer: Self-pay | Admitting: Emergency Medicine

## 2017-02-22 ENCOUNTER — Emergency Department (HOSPITAL_COMMUNITY)
Admission: EM | Admit: 2017-02-22 | Discharge: 2017-02-22 | Disposition: A | Discharge status: Home / Self Care | Payer: Medicaid Other | Attending: Emergency Medicine | Admitting: Emergency Medicine

## 2017-02-22 DIAGNOSIS — Z87891 Personal history of nicotine dependence: Secondary | ICD-10-CM | POA: Diagnosis not present

## 2017-02-22 DIAGNOSIS — H578 Other specified disorders of eye and adnexa: Secondary | ICD-10-CM | POA: Diagnosis not present

## 2017-02-22 DIAGNOSIS — H1033 Unspecified acute conjunctivitis, bilateral: Secondary | ICD-10-CM | POA: Insufficient documentation

## 2017-02-22 DIAGNOSIS — H10023 Other mucopurulent conjunctivitis, bilateral: Secondary | ICD-10-CM | POA: Diagnosis present

## 2017-02-22 DIAGNOSIS — J45909 Unspecified asthma, uncomplicated: Secondary | ICD-10-CM | POA: Diagnosis not present

## 2017-02-22 DIAGNOSIS — Z5321 Procedure and treatment not carried out due to patient leaving prior to being seen by health care provider: Secondary | ICD-10-CM | POA: Insufficient documentation

## 2017-02-22 MED ORDER — TETRACAINE HCL 0.5 % OP SOLN
1.0000 [drp] | Freq: Once | OPHTHALMIC | Status: AC
  Administered 2017-02-22: 1 [drp] via OPHTHALMIC
  Filled 2017-02-22: qty 4

## 2017-02-22 MED ORDER — FLUORESCEIN SODIUM 0.6 MG OP STRP
ORAL_STRIP | OPHTHALMIC | Status: AC
  Administered 2017-02-22: 1
  Filled 2017-02-22: qty 1

## 2017-02-22 MED ORDER — ERYTHROMYCIN 5 MG/GM OP OINT
TOPICAL_OINTMENT | Freq: Once | OPHTHALMIC | Status: AC
  Administered 2017-02-22: 15:00:00 via OPHTHALMIC
  Filled 2017-02-22: qty 3.5

## 2017-02-22 MED ORDER — FLUORESCEIN SODIUM 0.6 MG OP STRP
1.0000 | ORAL_STRIP | Freq: Once | OPHTHALMIC | Status: AC
  Administered 2017-02-22: 1 via OPHTHALMIC
  Filled 2017-02-22: qty 1

## 2017-02-22 NOTE — ED Triage Notes (Signed)
Patient here with complaints of bilateral eye irration. Yellow drainage in the mornings. OTC "pink eye" medication with some relief.

## 2017-02-22 NOTE — ED Triage Notes (Signed)
Pt states since yesterday her eyes have been having drainage and burning.

## 2017-02-22 NOTE — Discharge Instructions (Signed)
Use the eye ointment three times a day for the next week. Call Dr. Alinda Dooms office for follow up. Return here as needed.

## 2017-02-22 NOTE — ED Provider Notes (Signed)
[facility]-EMERGENCY DEPT Provider Note   CSN: [id number] Arrival date & time: 02/22/17  1242  By signing my name below, I, [name], attest that this documentation has been prepared under the direction and in the presence of Kerrie Buffalo, NP Electronically Signed: [name], ED Scribe. 02/22/17. 3:10 PM.  History   Chief Complaint Chief Complaint  Patient presents with  . Eye Drainage  . Conjunctivitis   The history is provided by the patient. No language interpreter was used.  Conjunctivitis  This is a new problem. The current episode started yesterday. The problem occurs constantly. The problem has not changed since onset.Pertinent negatives include no headaches. Nothing aggravates the symptoms. Nothing relieves the symptoms.   HPI Comments: Megan Valenzuela is an otherwise healthy 36 y.o. female who presents to the Emergency Department complaining of right eye pain with associated eye redness that began yesterday. Patient reports that she was outside and thought something my have irritated her right eye. She rubbed it and then this morning both eye had drainage and crusting that was yellow. The pt states that her associated yellow eye discharge began this morning. Per note, the pt tried some OTC medications for "pink eye" with inadequate relief.  She denies any injury to the eye. She also denies any positive sick contacts. The pt denies fever.   Past Medical History:  Diagnosis Date  . Asthma   . Dental abscess     There are no active problems to display for this patient.   Past Surgical History:  Procedure Laterality Date  . TYMPANOSTOMY TUBE PLACEMENT      OB History    No data available       Home Medications    Prior to Admission medications   Medication Sig Start Date End Date Taking? Authorizing Provider  ondansetron (ZOFRAN ODT) 8 MG disintegrating tablet Take 1 tablet (8 mg total) by mouth every 8 (eight) hours as needed for nausea or  vomiting. Patient not taking: Reported on 04/19/2016 07/19/15   Azalia Bilis, MD  promethazine (PHENERGAN) 25 MG tablet Take 1 tablet (25 mg total) by mouth every 6 (six) hours as needed for nausea or vomiting. Patient not taking: Reported on 04/19/2016 07/19/15   Azalia Bilis, MD    Family History No family history on file.  Social History Social History  Substance Use Topics  . Smoking status: Former Games developer  . Smokeless tobacco: Never Used  . Alcohol use No     Allergies   Latex   Review of Systems Review of Systems  Constitutional: Negative for fever.  HENT: Negative.   Eyes: Positive for photophobia, discharge, redness and itching. Negative for pain (irritation). Visual disturbance: blurry vision yesterday.  Gastrointestinal: Negative for nausea and vomiting.  Skin: Negative for rash.  Neurological: Negative for headaches.  Psychiatric/Behavioral: The patient is not nervous/anxious.      Physical Exam Updated Vital Signs BP (!) 127/92 (BP Location: Left Arm)   Pulse 68   Temp 98.5 F (36.9 C)   Resp 18   SpO2 98%   Physical Exam  Constitutional: She is oriented to person, place, and time. She appears well-developed and well-nourished. No distress.  HENT:  Head: Normocephalic.  Eyes: Pupils are equal, round, and reactive to light. EOM and lids are normal. Lids are everted and swept, no foreign bodies found. Right eye exhibits exudate. Right conjunctiva is injected. Left conjunctiva is injected.  Slit lamp exam:      The right eye shows  no corneal abrasion, no corneal ulcer, no foreign body and no fluorescein uptake.       The left eye shows no corneal abrasion, no corneal ulcer, no foreign body and no fluorescein uptake.  Neck: Normal range of motion.  Cardiovascular: Normal rate.   Pulmonary/Chest: Effort normal.  Abdominal: She exhibits no distension.  Musculoskeletal: Normal range of motion.  Neurological: She is alert and oriented to person, place, and  time.  Skin: Skin is warm and dry.  Psychiatric: She has a normal mood and affect. Her behavior is normal.  Nursing note and vitals reviewed.    ED Treatments / Results   DIAGNOSTIC STUDIES: Oxygen Saturation is 98% on RA, normal by my interpretation.   COORDINATION OF CARE: 2:10 PM-Discussed next steps with pt. Pt verbalized understanding and is agreeable with the plan.   Labs (all labs ordered are listed, but only abnormal results are displayed) Labs Reviewed - No data to display  Radiology No results found.  Procedures Procedures (including critical care time)  Medications Ordered in ED Medications  erythromycin ophthalmic ointment (not administered)  tetracaine (PONTOCAINE) 0.5 % ophthalmic solution 1 drop (1 drop Both Eyes Given 02/22/17 1438)  fluorescein ophthalmic strip 1 strip (1 strip Both Eyes Given 02/22/17 1438)  fluorescein 0.6 MG ophthalmic strip (1 strip  Given 02/22/17 1438)     Initial Impression / Assessment and Plan / ED Course  I have reviewed the triage vital signs and the nursing notes.  No foreign bodies noted. No surrounding erythema, swelling, vision changes/loss suspicious for orbital or periorbital cellulitis. No signs of iritis. No symptoms of retinal detachment. No ophthalmologic emergency suspected. Outpatient referral given in case of no improvement. Will treat for conjunctivitis with erythromycin opth ointment with fist dose instilled prior to d/c.   Final Clinical Impressions(s) / ED Diagnoses   Final diagnoses:  Acute bacterial conjunctivitis of both eyes    New Prescriptions New Prescriptions   No medications on file  I personally performed the services described in this documentation, which was scribed in my presence. The recorded information has been reviewed and is accurate.    Kerrie Buffalo [place], [place] 02/22/17 1517    Alvira Monday, MD 02/24/17 [phone]

## 2017-04-19 ENCOUNTER — Encounter (HOSPITAL_COMMUNITY): Payer: Self-pay | Admitting: Emergency Medicine

## 2017-04-19 ENCOUNTER — Emergency Department (HOSPITAL_COMMUNITY)
Admission: EM | Admit: 2017-04-19 | Discharge: 2017-04-19 | Disposition: A | Discharge status: Home / Self Care | Payer: Medicaid Other | Attending: Emergency Medicine | Admitting: Emergency Medicine

## 2017-04-19 DIAGNOSIS — J029 Acute pharyngitis, unspecified: Secondary | ICD-10-CM | POA: Insufficient documentation

## 2017-04-19 DIAGNOSIS — Z87891 Personal history of nicotine dependence: Secondary | ICD-10-CM | POA: Diagnosis not present

## 2017-04-19 DIAGNOSIS — J45909 Unspecified asthma, uncomplicated: Secondary | ICD-10-CM | POA: Diagnosis not present

## 2017-04-19 DIAGNOSIS — B349 Viral infection, unspecified: Secondary | ICD-10-CM | POA: Insufficient documentation

## 2017-04-19 LAB — RAPID STREP SCREEN (MED CTR MEBANE ONLY): STREPTOCOCCUS, GROUP A SCREEN (DIRECT): NEGATIVE

## 2017-04-19 NOTE — ED Provider Notes (Signed)
[facility]-EMERGENCY DEPT Provider Note   CSN: [id number] Arrival date & time: 04/19/17  0730     History   Chief Complaint Chief Complaint  Patient presents with  . Sore Throat    HPI Megan Valenzuela is a 36 y.o. female.  The history is provided by the patient.  Sore Throat  This is a new problem. The current episode started yesterday. The problem occurs constantly. The problem has been gradually worsening. Associated symptoms include headaches. Pertinent negatives include no chest pain, no abdominal pain and no shortness of breath. The symptoms are aggravated by swallowing. Nothing relieves the symptoms. She has tried nothing for the symptoms.   36 year old female who presents with sore throat. Onset yesterday, worsening over past day. Associated with mild cough with sputum, runny nose, congestion, mild headache. No fever, but feels chills. No nausea, vomiting, diarrhea, urinary complaints. Husband with URI symptoms recently. Headache and abdominal discomfort yesterday with generalized aches, but all of this resolved.  Past Medical History:  Diagnosis Date  . Asthma   . Dental abscess     There are no active problems to display for this patient.   Past Surgical History:  Procedure Laterality Date  . TYMPANOSTOMY TUBE PLACEMENT      OB History    No data available       Home Medications    Prior to Admission medications   Medication Sig Start Date End Date Taking? Authorizing Provider  ondansetron (ZOFRAN ODT) 8 MG disintegrating tablet Take 1 tablet (8 mg total) by mouth every 8 (eight) hours as needed for nausea or vomiting. Patient not taking: Reported on 04/19/2016 07/19/15   Azalia Bilis, MD  promethazine (PHENERGAN) 25 MG tablet Take 1 tablet (25 mg total) by mouth every 6 (six) hours as needed for nausea or vomiting. Patient not taking: Reported on 04/19/2016 07/19/15   Azalia Bilis, MD    Family History No family history on file.  Social  History Social History  Substance Use Topics  . Smoking status: Former Games developer  . Smokeless tobacco: Never Used  . Alcohol use No     Allergies   Flagyl [metronidazole] and Latex   Review of Systems Review of Systems  Constitutional: Positive for chills. Negative for fever.  HENT: Positive for congestion and sore throat.   Respiratory: Negative for shortness of breath.   Cardiovascular: Negative for chest pain.  Gastrointestinal: Negative for abdominal pain.  Allergic/Immunologic: Negative for immunocompromised state.  Neurological: Positive for headaches.  Hematological: Does not bruise/bleed easily.     Physical Exam Updated Vital Signs BP (!) 135/95   Pulse (!) 108   Temp (!) 97.5 F (36.4 C) (Oral)   Resp 16   LMP 03/22/2017   SpO2 97%   Physical Exam Physical Exam  Nursing note and vitals reviewed. Constitutional: Well developed, well nourished, non-toxic, and in no acute distress Head: Normocephalic and atraumatic.  Mouth/Throat: Oropharynx is clear and moist.  Neck: Normal range of motion. Neck supple. full next extension. Cardiovascular: Normal rate and regular rhythm.   Pulmonary/Chest: Effort normal and breath sounds normal.  Abdominal: Soft. There is no tenderness. There is no rebound and no guarding.  Musculoskeletal: Normal range of motion.  Neurological: Alert, no facial droop, fluent speech, moves all extremities symmetrically Skin: Skin is warm and dry.  Psychiatric: Cooperative   ED Treatments / Results  Labs (all labs ordered are listed, but only abnormal results are displayed) Labs Reviewed  RAPID STREP SCREEN (  NOT AT [facility])  CULTURE, GROUP A STREP [facility])    EKG  EKG Interpretation None       Radiology No results found.  Procedures Procedures (including critical care time)  Medications Ordered in ED Medications - No data to display   Initial Impression / Assessment and Plan / ED Course  I have reviewed the triage vital  signs and the nursing notes.  Pertinent labs & imaging results that were available during my care of the patient were reviewed by me and considered in my medical decision making (see chart for details).     36 year old female who presents with one day of sore throat. She is well-appearing and in no acute distress. Oropharynx overall clear. Handling secretions. Breathing comfortably. Normal range of motion of the neck. At this time there does not appear to be any concern for soft tissue neck abscess or other serious infections. Her strep test is negative. This is likely viral pharyngitis with URI symptoms. Discussed continued supportive care management. Strict return and follow-up instructions reviewed. She expressed understanding of all discharge instructions and felt comfortable with the plan of care.   Final Clinical Impressions(s) / ED Diagnoses   Final diagnoses:  Viral pharyngitis    New Prescriptions New Prescriptions   No medications on file     Lavera Guise, MD 04/19/17 1000

## 2017-04-19 NOTE — ED Triage Notes (Signed)
Patient c/o sore throat since yesterday and has gotten worse over night.

## 2017-04-19 NOTE — Discharge Instructions (Signed)
Your sore throat is due to a virus. Your strep test is negative.   You need time for this to get better on it's own. It can take 1-2 weeks to fully resolve Take ibuprofen and tylenol for pain Drink fluids and get rest.  Return for worsening symptoms, including fevers, difficulty breathing, swelling of the neck or throat, or any other symptoms concerning to you.

## 2017-04-20 LAB — CULTURE, GROUP A STREP (THRC)

## 2017-05-14 ENCOUNTER — Emergency Department (HOSPITAL_COMMUNITY)
Admission: EM | Admit: 2017-05-14 | Discharge: 2017-05-14 | Disposition: A | Discharge status: Home / Self Care | Payer: Medicaid Other | Attending: Emergency Medicine | Admitting: Emergency Medicine

## 2017-05-14 ENCOUNTER — Encounter (HOSPITAL_COMMUNITY): Payer: Self-pay | Admitting: Family Medicine

## 2017-05-14 DIAGNOSIS — Z91040 Latex allergy status: Secondary | ICD-10-CM | POA: Insufficient documentation

## 2017-05-14 DIAGNOSIS — J069 Acute upper respiratory infection, unspecified: Secondary | ICD-10-CM | POA: Diagnosis not present

## 2017-05-14 DIAGNOSIS — J029 Acute pharyngitis, unspecified: Secondary | ICD-10-CM | POA: Diagnosis present

## 2017-05-14 DIAGNOSIS — Z87891 Personal history of nicotine dependence: Secondary | ICD-10-CM | POA: Diagnosis not present

## 2017-05-14 LAB — RAPID STREP SCREEN (MED CTR MEBANE ONLY): Streptococcus, Group A Screen (Direct): NEGATIVE

## 2017-05-14 MED ORDER — AZITHROMYCIN 250 MG PO TABS: 250.0000 mg | ORAL_TABLET | Freq: Every day | ORAL | 0 refills | Status: DC

## 2017-05-14 NOTE — ED Provider Notes (Signed)
[facility]-EMERGENCY DEPT Provider Note   CSN: [id number] Arrival date & time: 05/14/17  1936     History   Chief Complaint Chief Complaint  Patient presents with  . Sore Throat  . Fever    HPI Megan Valenzuela is a 36 y.o. female who presents to the ED with sore throat and gland swelling. Patient reports that she was here last month with similar symptoms and treated for viral URI. Patient has been taking OTC medications with minimal relief. Patient reports getting a little better for a while but then the symptoms came back worse.   HPI  Past Medical History:  Diagnosis Date  . Asthma   . Dental abscess     There are no active problems to display for this patient.   Past Surgical History:  Procedure Laterality Date  . TYMPANOSTOMY TUBE PLACEMENT      OB History    No data available       Home Medications    Prior to Admission medications   Medication Sig Start Date End Date Taking? Authorizing Provider  azithromycin (ZITHROMAX) 250 MG tablet Take 1 tablet (250 mg total) daily by mouth. Take first 2 tablets together, then 1 every day until finished. 05/14/17   Janne NapoleonNeese, Ezechiel [place]ook[place] M, NP    Family History History reviewed. No pertinent family history.  Social History Social History   Tobacco Use  . Smoking status: Former Games developermoker  . Smokeless tobacco: Never Used  Substance Use Topics  . Alcohol use: No  . Drug use: No     Allergies   Flagyl [metronidazole] and Latex   Review of Systems Review of Systems  Constitutional: Positive for chills and fever.  HENT: Positive for congestion, ear pain and sore throat.   Eyes: Negative for pain, discharge, redness and itching.  Respiratory: Negative for cough and shortness of breath.   Cardiovascular: Negative for chest pain.  Gastrointestinal: Negative for abdominal pain, nausea and vomiting.  Genitourinary: Negative for dysuria, frequency and urgency.  Musculoskeletal: Positive for  myalgias. Negative for back pain.  Skin: Negative for rash.  Neurological: Positive for headaches. Negative for syncope.  Hematological: Positive for adenopathy.  Psychiatric/Behavioral: Negative for confusion.     Physical Exam Updated Vital Signs BP (!) 145/93 (BP Location: Right Arm)   Pulse 80   Temp 98.5 F (36.9 C) (Oral)   Resp 20   Ht 5\' 8"  (1.727 m)   Wt 117.9 kg (260 lb)   SpO2 96%   BMI 39.53 kg/m   Physical Exam  Constitutional: She is oriented to person, place, and time. She appears well-developed and well-nourished. She does not appear ill. No distress.  HENT:  Head: Normocephalic and atraumatic.  Right Ear: Tympanic membrane normal.  Left Ear: Tympanic membrane normal.  Nose: Rhinorrhea present.  Mouth/Throat: Uvula is midline and mucous membranes are normal. No oral lesions. No uvula swelling. No posterior oropharyngeal edema. Posterior oropharyngeal erythema: mild.  Post nasal drainage.  Eyes: EOM are normal.  Neck: Normal range of motion. Neck supple.  Cardiovascular: Normal rate and regular rhythm.  Pulmonary/Chest: Effort normal.  Abdominal: Soft. There is no tenderness.  Musculoskeletal: Normal range of motion.  Neurological: She is alert and oriented to person, place, and time. No cranial nerve deficit.  Skin: Skin is warm and dry.  Psychiatric: She has a normal mood and affect. Her behavior is normal.  Nursing note and vitals reviewed.    ED Treatments / Results  Labs (  all labs ordered are listed, but only abnormal results are displayed) Labs Reviewed  [facility]APID [facility]CREEN ([facility])  CULTURE, GROUP A STREP [facility])    Radiology No results found.  Procedures Procedures (including critical care time)  Medications Ordered in ED Medications - No data to display   Initial Impression / Assessment and Plan / ED Course  I have reviewed the triage vital signs and the nursing notes. 36 y.o. female with recurrent URI symptoms stable for d/c  without respiratory symptoms and no difficulty swallowing.   Final Clinical Impressions(s) / ED Diagnoses   Final diagnoses:  Upper respiratory tract infection, unspecified type    ED Discharge Orders        Ordered    azithromycin (ZITHROMAX) 250 MG tablet  Daily     05/14/17 2157       Kerrie Buffaloeese, Umi Mainor RobbinsvilleM, NP 05/14/17 2218    Maia PlanLong, Joshua G, MD 05/15/17 1418

## 2017-05-14 NOTE — Discharge Instructions (Signed)
Continue to use the OTC medications you have at home. Follow up with your doctor. Return here as needed.

## 2017-05-14 NOTE — ED Triage Notes (Signed)
Patient is complaining of a sore throat and unmeasured fever. Patient reports her symptoms have occurred over a month. Patient has been using OTC medication with little relief.

## 2017-05-16 LAB — CULTURE, GROUP A STREP (THRC)

## 2017-05-30 ENCOUNTER — Encounter (HOSPITAL_COMMUNITY): Payer: Self-pay | Admitting: Nurse Practitioner

## 2017-05-30 ENCOUNTER — Emergency Department (HOSPITAL_COMMUNITY)
Admission: EM | Admit: 2017-05-30 | Discharge: 2017-05-30 | Disposition: A | Discharge status: Home / Self Care | Payer: Medicaid Other | Attending: Emergency Medicine | Admitting: Emergency Medicine

## 2017-05-30 DIAGNOSIS — Z87891 Personal history of nicotine dependence: Secondary | ICD-10-CM | POA: Diagnosis not present

## 2017-05-30 DIAGNOSIS — R519 Headache, unspecified: Secondary | ICD-10-CM

## 2017-05-30 DIAGNOSIS — R51 Headache: Secondary | ICD-10-CM | POA: Insufficient documentation

## 2017-05-30 DIAGNOSIS — Z91040 Latex allergy status: Secondary | ICD-10-CM | POA: Insufficient documentation

## 2017-05-30 DIAGNOSIS — J45909 Unspecified asthma, uncomplicated: Secondary | ICD-10-CM | POA: Diagnosis not present

## 2017-05-30 LAB — I-STAT CHEM 8, ED
BUN: 10 mg/dL (ref 6–20)
CHLORIDE: 105 mmol/L (ref 101–111)
Calcium, Ion: 1.16 mmol/L (ref 1.15–1.40)
Creatinine, Ser: 0.8 mg/dL (ref 0.44–1.00)
Glucose, Bld: 89 mg/dL (ref 65–99)
HEMATOCRIT: 38 % (ref 36.0–46.0)
HEMOGLOBIN: 12.9 g/dL (ref 12.0–15.0)
POTASSIUM: 4.3 mmol/L (ref 3.5–5.1)
Sodium: 138 mmol/L (ref 135–145)
TCO2: 23 mmol/L (ref 22–32)

## 2017-05-30 LAB — I-STAT BETA HCG BLOOD, ED (MC, WL, AP ONLY): I-stat hCG, quantitative: <5 m[IU]/mL (ref <5)

## 2017-05-30 MED ORDER — MECLIZINE HCL 25 MG PO TABS
25.0000 mg | ORAL_TABLET | Freq: Once | ORAL | Status: AC
  Administered 2017-05-30: 25 mg via ORAL
  Filled 2017-05-30: qty 1

## 2017-05-30 MED ORDER — NAPROXEN 500 MG PO TABS
500.0000 mg | ORAL_TABLET | Freq: Once | ORAL | Status: AC
  Administered 2017-05-30: 500 mg via ORAL
  Filled 2017-05-30: qty 1

## 2017-05-30 NOTE — Discharge Instructions (Signed)
Follow-up with your primary care doctor regarding your visit to the emergency department today.  Your workup in the ED was reassuring.  We advised ibuprofen or Aleve for persistent headache management.  Drink plenty of water to prevent dehydration.

## 2017-05-30 NOTE — ED Triage Notes (Signed)
Pt states she suspects she is dehydrated, states she has been mildly constipated and having dry eyes.

## 2017-05-30 NOTE — ED Provider Notes (Signed)
[place] DEPT Provider Note   CSN: [id number] Arrival date & time: 05/30/17  0132    History   Chief Complaint Chief Complaint  Patient presents with  . Dehydration    HPI Megan Valenzuela is a 36 y.o. female.  36 year old female with a history of asthma presents to the emergency department with a myriad of complaints.  She primarily complains of a frontal, aching headache which began this evening.  This has been associated with some mild dizziness which was aggravated with position change, but has spontaneously improved.  She did experience some nausea, but denies any vomiting.  She has not taken any medications for her symptoms today.  No recent fevers.  She does report feeling constipated recently, but had a bowel movement today.  She is wondering if her symptoms may be brought on by dehydration.  She has been experiencing bilateral eye dryness over the past few days.  Patient has not had any syncope, shortness of breath, extremity numbness or paresthesias, extremity weakness associated with her headache today.  No recent head injury or trauma.      Past Medical History:  Diagnosis Date  . Asthma   . Dental abscess     There are no active problems to display for this patient.   Past Surgical History:  Procedure Laterality Date  . TYMPANOSTOMY TUBE PLACEMENT      OB History    No data available       Home Medications    Prior to Admission medications   Not on File    Family History History reviewed. No pertinent family history.  Social History Social History   Tobacco Use  . Smoking status: Former Research scientist (life sciences)  . Smokeless tobacco: Never Used  Substance Use Topics  . Alcohol use: No  . Drug use: No     Allergies   Flagyl [metronidazole] and Latex   Review of Systems Review of Systems Ten systems reviewed and are negative for acute change, except as noted in the HPI.    Physical Exam Updated Vital Signs BP  112/70 (BP Location: Right Arm)   Pulse 69   Temp 98.1 F (36.7 C) (Oral)   Resp (!) 99   SpO2 100%   Physical Exam  Constitutional: She is oriented to person, place, and time. She appears well-developed and well-nourished. No distress.  Nontoxic and in NAD  HENT:  Head: Normocephalic and atraumatic.  Mild fluid in right middle ear. Oropharynx clear. Symmetric rise of the uvula with phonation.   Eyes: Conjunctivae and EOM are normal. Pupils are equal, round, and reactive to light. No scleral icterus.  Neck: Normal range of motion.  No meningismus  Cardiovascular: Normal rate, regular rhythm and intact distal pulses.  Pulmonary/Chest: Effort normal. No stridor. No respiratory distress. She has no wheezes.  Respirations even and unlabored  Musculoskeletal: Normal range of motion.  Neurological: She is alert and oriented to person, place, and time. No cranial nerve deficit. She exhibits normal muscle tone. Coordination normal.  GCS 15. Speech is goal oriented. No cranial nerve deficits appreciated; symmetric eyebrow raise, no facial drooping, tongue midline. Patient has equal grip strength bilaterally with 5/5 strength against resistance in all major muscle groups bilaterally. Sensation to light touch intact. Patient moves extremities without ataxia.  Skin: Skin is warm and dry. No rash noted. She is not diaphoretic. No erythema. No pallor.  Psychiatric: She has a normal mood and affect. Her behavior is normal.  Nursing note  and vitals reviewed.    ED Treatments / Results  Labs (all labs ordered are listed, but only abnormal results are displayed) Labs Reviewed  I-STAT CHEM 8, ED  I-STAT BETA HCG BLOOD, ED ([facility], [facility], [facility] ONLY)    EKG  EKG Interpretation None       Radiology No results found.  Procedures Procedures (including critical care time)  Medications Ordered in ED Medications  naproxen (NAPROSYN) tablet 500 mg (500 mg Oral Given 05/30/17 0456)  meclizine  (ANTIVERT) tablet 25 mg (25 mg Oral Given 05/30/17 0457)     Initial Impression / Assessment and Plan / ED Course  I have reviewed the triage vital signs and the nursing notes.  Pertinent labs & imaging results that were available during my care of the patient were reviewed by me and considered in my medical decision making (see chart for details).     36 year old female presents for a frontal aching headache with some mild dizziness.  Symptoms have improved with naproxen and meclizine.  Patient has a nonfocal neurologic exam.  I do not have a concern for a central cause of vertiginous symptoms.  No history of head injury or trauma.  She is afebrile and without nuchal rigidity or meningismus.   Patient expresses concern for dehydration; however, her orthostatic vital signs are stable.  Laboratory workup is also reassuring.  Patient is alert and pleasant, in no distress on reassessment.  She does have a primary care physician with whom she can follow-up.  Mild vertigo may be associated with mild frontal headache.  I do not believe further emergent workup is indicated at this time. Patient agreeable and comfortable with outpatient follow up.  Return precautions discussed and provided.  Patient discharged in stable condition with no unaddressed concerns.   Final Clinical Impressions(s) / ED Diagnoses   Final diagnoses:  Nonintractable headache, unspecified chronicity pattern, unspecified headache type    ED Discharge Orders    None       Antonietta Breach, PA-C 05/30/17 8546    Orpah Greek, MD 05/30/17 [phone]

## 2017-06-30 ENCOUNTER — Emergency Department (HOSPITAL_COMMUNITY)
Admission: EM | Admit: 2017-06-30 | Discharge: 2017-06-30 | Disposition: A | Discharge status: Home / Self Care | Payer: Medicaid Other | Attending: Emergency Medicine | Admitting: Emergency Medicine

## 2017-06-30 ENCOUNTER — Other Ambulatory Visit: Payer: Self-pay

## 2017-06-30 ENCOUNTER — Emergency Department (HOSPITAL_COMMUNITY): Payer: Medicaid Other

## 2017-06-30 ENCOUNTER — Encounter (HOSPITAL_COMMUNITY): Payer: Self-pay | Admitting: Emergency Medicine

## 2017-06-30 DIAGNOSIS — Z87891 Personal history of nicotine dependence: Secondary | ICD-10-CM | POA: Diagnosis not present

## 2017-06-30 DIAGNOSIS — R05 Cough: Secondary | ICD-10-CM | POA: Insufficient documentation

## 2017-06-30 DIAGNOSIS — Z91040 Latex allergy status: Secondary | ICD-10-CM | POA: Insufficient documentation

## 2017-06-30 DIAGNOSIS — J45909 Unspecified asthma, uncomplicated: Secondary | ICD-10-CM | POA: Insufficient documentation

## 2017-06-30 DIAGNOSIS — R079 Chest pain, unspecified: Secondary | ICD-10-CM | POA: Diagnosis present

## 2017-06-30 DIAGNOSIS — R072 Precordial pain: Secondary | ICD-10-CM | POA: Diagnosis not present

## 2017-06-30 LAB — HEPATIC FUNCTION PANEL
ALBUMIN: 3.7 g/dL (ref 3.5–5.0)
ALK PHOS: 81 U/L (ref 38–126)
ALT: 14 U/L (ref 14–54)
AST: 21 U/L (ref 15–41)
Bilirubin, Direct: 0.1 mg/dL (ref 0.1–0.5)
Indirect Bilirubin: 0.5 mg/dL (ref 0.3–0.9)
TOTAL PROTEIN: 7.5 g/dL (ref 6.5–8.1)
Total Bilirubin: 0.6 mg/dL (ref 0.3–1.2)

## 2017-06-30 LAB — CBC
HEMATOCRIT: 38 % (ref 36.0–46.0)
Hemoglobin: 12.4 g/dL (ref 12.0–15.0)
MCH: 28.6 pg (ref 26.0–34.0)
MCHC: 32.6 g/dL (ref 30.0–36.0)
MCV: 87.8 fL (ref 78.0–100.0)
PLATELETS: 213 10*3/uL (ref 150–400)
RBC: 4.33 MIL/uL (ref 3.87–5.11)
RDW: 13.9 % (ref 11.5–15.5)
WBC: 8 10*3/uL (ref 4.0–10.5)

## 2017-06-30 LAB — LIPASE, BLOOD: Lipase: 30 U/L (ref 11–51)

## 2017-06-30 LAB — BASIC METABOLIC PANEL
Anion gap: 5 (ref 5–15)
BUN: 12 mg/dL (ref 6–20)
CHLORIDE: 107 mmol/L (ref 101–111)
CO2: 25 mmol/L (ref 22–32)
CREATININE: 0.98 mg/dL (ref 0.44–1.00)
Calcium: 8.9 mg/dL (ref 8.9–10.3)
GFR calc Af Amer: >60 mL/min (ref >60)
GFR calc non Af Amer: >60 mL/min (ref >60)
GLUCOSE: 101 mg/dL — AB (ref 65–99)
POTASSIUM: 3.9 mmol/L (ref 3.5–5.1)
Sodium: 137 mmol/L (ref 135–145)

## 2017-06-30 LAB — D-DIMER, QUANTITATIVE (NOT AT ARMC)

## 2017-06-30 LAB — I-STAT TROPONIN, ED: Troponin i, poc: 0.01 ng/mL (ref 0.00–0.08)

## 2017-06-30 LAB — I-STAT BETA HCG BLOOD, ED (MC, WL, AP ONLY): I-stat hCG, quantitative: <5 m[IU]/mL (ref <5)

## 2017-06-30 MED ORDER — CYCLOBENZAPRINE HCL 5 MG PO TABS: 5.0000 mg | ORAL_TABLET | Freq: Two times a day (BID) | ORAL | 0 refills | Status: DC | PRN

## 2017-06-30 MED ORDER — CYCLOBENZAPRINE HCL 10 MG PO TABS
10.0000 mg | ORAL_TABLET | Freq: Once | ORAL | Status: AC
  Administered 2017-06-30: 10 mg via ORAL
  Filled 2017-06-30: qty 1

## 2017-06-30 MED ORDER — GI COCKTAIL ~~LOC~~
30.0000 mL | Freq: Once | ORAL | Status: AC
  Administered 2017-06-30: 30 mL via ORAL
  Filled 2017-06-30: qty 30

## 2017-06-30 MED ORDER — OMEPRAZOLE 20 MG PO CPDR: 20.0000 mg | DELAYED_RELEASE_CAPSULE | Freq: Every day | ORAL | 0 refills | Status: DC

## 2017-06-30 NOTE — ED Provider Notes (Signed)
[facility]-EMERGENCY DEPT Provider Note   CSN: [id number] Arrival date & time: 06/30/17  1959     History   Chief Complaint Chief Complaint  Patient presents with  . Chest Pain    HPI Megan Valenzuela is a 36 y.o. female.  The history is provided by the patient. No language interpreter was used.  Chest Pain   This is a chronic problem. The current episode started more than 1 week ago (8 months). The problem occurs constantly. The problem has not changed since onset.The pain is associated with coughing. The pain is present in the substernal region. The pain is at a severity of 7/10. The pain is moderate. The quality of the pain is described as sharp and heavy. The pain radiates to the epigastrium. The symptoms are aggravated by certain positions. Associated symptoms include abdominal pain (epigastric), cough and sputum production. Pertinent negatives include no back pain, no diaphoresis, no dizziness, no exertional chest pressure, no fever, no headaches, no irregular heartbeat, no leg pain, no lower extremity edema, no nausea, no numbness, no palpitations, no PND, no shortness of breath, no syncope and no vomiting. She has tried nothing for the symptoms. The treatment provided no relief. Risk factors include obesity.  Pertinent negatives for past medical history include no seizures.    Past Medical History:  Diagnosis Date  . Asthma   . Dental abscess     There are no active problems to display for this patient.   Past Surgical History:  Procedure Laterality Date  . TYMPANOSTOMY TUBE PLACEMENT      OB History    No data available       Home Medications    Prior to Admission medications   Not on File    Family History History reviewed. No pertinent family history.  Social History Social History   Tobacco Use  . Smoking status: Former Games developermoker  . Smokeless tobacco: Never Used  Substance Use Topics  . Alcohol use: No  . Drug use: No      Allergies   Flagyl [metronidazole] and Latex   Review of Systems Review of Systems  Constitutional: Negative for diaphoresis, fatigue and fever.  HENT: Negative for congestion.   Eyes: Negative for visual disturbance.  Respiratory: Positive for cough and sputum production. Negative for choking, chest tightness, shortness of breath, wheezing and stridor.   Cardiovascular: Positive for chest pain. Negative for palpitations, syncope and PND.  Gastrointestinal: Positive for abdominal pain (epigastric). Negative for abdominal distention, constipation, diarrhea, nausea and vomiting.  Genitourinary: Negative for dysuria, enuresis, flank pain and frequency.  Musculoskeletal: Negative for back pain, neck pain and neck stiffness.  Skin: Negative for rash and wound.  Neurological: Negative for dizziness, seizures, light-headedness, numbness and headaches.  Psychiatric/Behavioral: Negative for agitation and confusion.  All other systems reviewed and are negative.    Physical Exam Updated Vital Signs BP 118/81 (BP Location: Right Arm)   Pulse (!) 106   Temp 98.2 F (36.8 C) (Oral)   Resp 20   Ht 5\' 8"  (1.727 m)   Wt 117.9 kg (260 lb)   LMP 06/07/2017   SpO2 99%   BMI 39.53 kg/m   Physical Exam  Constitutional: She is oriented to person, place, and time. She appears well-developed and well-nourished. No distress.  HENT:  Head: Normocephalic and atraumatic.  Mouth/Throat: Oropharynx is clear and moist. No oropharyngeal exudate.  Eyes: Conjunctivae and EOM are normal. Pupils are equal, round, and reactive to light.  Neck: Normal range of motion.  Cardiovascular: Normal rate and intact distal pulses.  No murmur heard. Pulmonary/Chest: Effort normal and breath sounds normal. No stridor. No respiratory distress. She has no wheezes. She has no rales. She exhibits no tenderness.  Abdominal: Soft. Bowel sounds are normal. There is no tenderness.  Musculoskeletal: She exhibits no  tenderness.  Neurological: She is alert and oriented to person, place, and time. No sensory deficit. She exhibits normal muscle tone.  Skin: Capillary refill takes less than 2 seconds. No rash noted. She is not diaphoretic. No erythema.  Psychiatric: She has a normal mood and affect.  Nursing note and vitals reviewed.    ED Treatments / Results  Labs (all labs ordered are listed, but only abnormal results are displayed) Labs Reviewed  BASIC METABOLIC PANEL - Abnormal; Notable for the following components:      Result Value   Glucose, Bld 101 (*)    All other components within normal limits  CBC  LIPASE, BLOOD  HEPATIC FUNCTION PANEL  D-DIMER, QUANTITATIVE (NOT AT [facility])  I-STAT TROPONIN, ED  I-STAT BETA HCG BLOOD, ED ([facility], [facility], [facility] ONLY)    EKG  EKG Interpretation  Date/Time:  Monday June 30 2017 20:06:10 EST Ventricular Rate:  108 PR Interval:    QRS Duration: 95 QT Interval:  343 QTC Calculation: 460 R Axis:   47 Text Interpretation:  Sinus tachycardia Low voltage, precordial leads Borderline repolarization abnormality When compared to prior, no significant changes seen.  No STEMI Confirmed by Theda Belfastegeler, Chris ([id number]) on 06/30/2017 8:43:08 PM       Radiology Dg Chest 2 View  Result Date: 06/30/2017 CLINICAL DATA:  Chest pain EXAM: CHEST  2 VIEW COMPARISON:  11/22/2016 FINDINGS: Slightly low lung volume. The heart size and mediastinal contours are within normal limits. Both lungs are clear. The visualized skeletal structures are unremarkable. IMPRESSION: No active cardiopulmonary disease.  Low lung volume. Electronically Signed   By: Jasmine PangKim  Fujinaga M.D.   On: 06/30/2017 21:42    Procedures Procedures (including critical care time)  Medications Ordered in ED Medications  gi cocktail (Maalox,Lidocaine,Donnatal) (30 mLs Oral Given 06/30/17 2235)  cyclobenzaprine (FLEXERIL) tablet 10 mg (10 mg Oral Given 06/30/17 2236)     Initial Impression / Assessment and Plan /  ED Course  I have reviewed the triage vital signs and the nursing notes.  Pertinent labs & imaging results that were available during my care of the patient were reviewed by me and considered in my medical decision making (see chart for details).     Megan Valenzuela is a 36 y.o. female with a past medical history significant for asthma who presents with chest pain.  Patient reports that for the last 8 months she has had nearly constant chest pain.  She reports that today she is sought evaluation because it radiated towards her central chest and her epigastrium.  She reports it felt like a burning sensation.  She reports no significant shortness of breath she denies traumatic injury.  She denies any fevers, chills.,  Syncope.  She does report a recent cough.  Patient denies history of DVT, PE, or cardiac disease.  On exam, chest has no significant tenderness.  Patient reports her pain is in the central chest and epigastrium although this was not reproduced.  Lungs were clear.  Back nontender.  No significant leg edema or other abnormalities.  No focal neurologic deficits.  Normal pulses in all extremities.  EKG reassuring.  Laboratory testing  reassuring.  Patient given muscle relaxant to help if it is a musculoskeletal pain and will also given GI cocktail given concern for reflux.  Patient reports her pain resolved in the ED after medications.  Laboratory testing was reassuring.  Given negative troponin, negative d-dimer, and other reassuring labs, patient was felt stable for discharge home.  Patient given prescription for omeprazole and Flexeril.  Patient will follow up with PCP for reassessment and further management.  Patient had no other questions or concerns and was discharged in good condition with resolving symptoms.  Final Clinical Impressions(s) / ED Diagnoses   Final diagnoses:  Precordial pain    ED Discharge Orders        Ordered    cyclobenzaprine (FLEXERIL) 5 MG tablet  2  times daily PRN     06/30/17 2316    omeprazole (PRILOSEC) 20 MG capsule  Daily     06/30/17 2316      Clinical Impression: 1. Precordial pain     Disposition: Discharge  Condition: Good  I have discussed the results, Dx and Tx plan with the pt(& family if present). He/she/they expressed understanding and agree(s) with the plan. Discharge instructions discussed at great length. Strict return precautions discussed and pt &/or family have verbalized understanding of the instructions. No further questions at time of discharge.    This SmartLink is deprecated. Use AVSMEDLIST instead to display the medication list for a patient.  Follow Up: Inc, [facility] [address] [place] [address] [phone]     [facility] [address] [id number] mc [address] [place] [address] [phone]        Shiro Ellerman, Canary Brim, MD 07/01/17 [phone]

## 2017-06-30 NOTE — ED Triage Notes (Signed)
Patient states she feels like something is coming up in the middle of her chest. Patient states something dont feel right. Patient states that her mid chest is in pain. Patient states she has taken gas pills/gingerale and the pain is still there.

## 2017-06-30 NOTE — Discharge Instructions (Signed)
Your workup today showed no evidence of heart injury, blood clot, or infectious cause of her symptoms.  As her symptoms improved with the muscle relaxant in the GI medicine, we suspect it is likely due to reflux or musculoskeletal pain.  Please use the medicines prescribed to help with your symptoms.  Please follow-up with your primary care physician for reassessment and further management.  If any symptoms change or worsen, please return to the nearest emergency department.

## 2017-07-12 ENCOUNTER — Ambulatory Visit (HOSPITAL_COMMUNITY)
Admission: EM | Admit: 2017-07-12 | Discharge: 2017-07-12 | Disposition: A | Discharge status: Home / Self Care | Payer: Worker's Compensation | Attending: Family Medicine | Admitting: Family Medicine

## 2017-07-12 ENCOUNTER — Other Ambulatory Visit: Payer: Self-pay

## 2017-07-12 ENCOUNTER — Encounter (HOSPITAL_COMMUNITY): Payer: Self-pay | Admitting: Emergency Medicine

## 2017-07-12 DIAGNOSIS — M545 Low back pain, unspecified: Secondary | ICD-10-CM

## 2017-07-12 MED ORDER — DICLOFENAC SODIUM 75 MG PO TBEC: 75.0000 mg | DELAYED_RELEASE_TABLET | Freq: Two times a day (BID) | ORAL | 0 refills | Status: DC
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
## 2017-07-12 NOTE — Discharge Instructions (Signed)
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
## 2017-07-16 ENCOUNTER — Encounter (HOSPITAL_COMMUNITY): Payer: Self-pay | Admitting: Emergency Medicine

## 2017-07-16 ENCOUNTER — Ambulatory Visit (HOSPITAL_COMMUNITY)
Admission: EM | Admit: 2017-07-16 | Discharge: 2017-07-16 | Disposition: A | Discharge status: Home / Self Care | Payer: Medicaid Other | Attending: Urgent Care | Admitting: Urgent Care

## 2017-07-16 ENCOUNTER — Other Ambulatory Visit: Payer: Self-pay

## 2017-07-16 DIAGNOSIS — N644 Mastodynia: Secondary | ICD-10-CM

## 2017-07-16 DIAGNOSIS — N61 Mastitis without abscess: Secondary | ICD-10-CM

## 2017-07-16 DIAGNOSIS — R4583 Excessive crying of child, adolescent or adult: Secondary | ICD-10-CM

## 2017-07-16 MED ORDER — DICLOXACILLIN SODIUM 500 MG PO CAPS: 500.0000 mg | ORAL_CAPSULE | Freq: Four times a day (QID) | ORAL | 0 refills | Status: DC

## 2017-07-16 MED ORDER — KETOROLAC TROMETHAMINE 60 MG/2ML IM SOLN: 60.0000 mg | Freq: Once | INTRAMUSCULAR | Status: DC

## 2017-07-16 MED ORDER — KETOROLAC TROMETHAMINE 60 MG/2ML IM SOLN
INTRAMUSCULAR | Status: AC
  Filled 2017-07-16: qty 2
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
## 2017-07-16 NOTE — ED Notes (Signed)
Notified mani, pa of patient's refusal

## 2017-07-29 ENCOUNTER — Encounter (HOSPITAL_COMMUNITY): Payer: Self-pay

## 2017-07-29 ENCOUNTER — Other Ambulatory Visit: Payer: Self-pay

## 2017-07-29 ENCOUNTER — Emergency Department (HOSPITAL_COMMUNITY)
Admission: EM | Admit: 2017-07-29 | Discharge: 2017-07-29 | Disposition: A | Discharge status: Home / Self Care | Payer: Medicaid Other | Attending: Emergency Medicine | Admitting: Emergency Medicine

## 2017-07-29 DIAGNOSIS — Z87891 Personal history of nicotine dependence: Secondary | ICD-10-CM | POA: Insufficient documentation

## 2017-07-29 DIAGNOSIS — Z91040 Latex allergy status: Secondary | ICD-10-CM | POA: Insufficient documentation

## 2017-07-29 DIAGNOSIS — Z79899 Other long term (current) drug therapy: Secondary | ICD-10-CM | POA: Diagnosis not present

## 2017-07-29 DIAGNOSIS — J45909 Unspecified asthma, uncomplicated: Secondary | ICD-10-CM | POA: Diagnosis not present

## 2017-07-29 DIAGNOSIS — M549 Dorsalgia, unspecified: Secondary | ICD-10-CM | POA: Diagnosis not present

## 2017-07-29 LAB — URINALYSIS, ROUTINE W REFLEX MICROSCOPIC
BILIRUBIN URINE: NEGATIVE
GLUCOSE, UA: NEGATIVE mg/dL
Hgb urine dipstick: NEGATIVE
Ketones, ur: NEGATIVE mg/dL
Leukocytes, UA: NEGATIVE
Nitrite: NEGATIVE
PH: 5 (ref 5.0–8.0)
Protein, ur: NEGATIVE mg/dL
SPECIFIC GRAVITY, URINE: 1.008 (ref 1.005–1.030)

## 2017-07-29 LAB — POC URINE PREG, ED: Preg Test, Ur: NEGATIVE

## 2017-07-29 MED ORDER — NAPROXEN 500 MG PO TABS: 500.0000 mg | ORAL_TABLET | Freq: Two times a day (BID) | ORAL | 0 refills | Status: DC

## 2017-07-29 MED ORDER — PREDNISONE 10 MG (21) PO TBPK: ORAL_TABLET | Freq: Every day | ORAL | 0 refills | Status: DC

## 2017-07-29 MED ORDER — NAPROXEN 500 MG PO TABS
500.0000 mg | ORAL_TABLET | Freq: Once | ORAL | Status: AC
  Administered 2017-07-29: 500 mg via ORAL
  Filled 2017-07-29: qty 1

## 2017-07-29 MED ORDER — PREDNISONE 20 MG PO TABS
60.0000 mg | ORAL_TABLET | Freq: Once | ORAL | Status: AC
  Administered 2017-07-29: 60 mg via ORAL
  Filled 2017-07-29: qty 3

## 2017-07-29 MED ORDER — METHOCARBAMOL 500 MG PO TABS: 500.0000 mg | ORAL_TABLET | Freq: Three times a day (TID) | ORAL | 0 refills | Status: DC | PRN

## 2017-07-29 NOTE — Discharge Instructions (Signed)
Back Pain:  I have prescribed you an anti-inflammatory medication, steroid, and a muscle relaxer.   Naproxen is a nonsteroidal anti-inflammatory medication that will help with pain and swelling. Be sure to take this medication as prescribed with food, 1 pill every 12 hours,  It should be taken with food, as it can cause stomach upset, and more seriously, stomach bleeding. Do not take other nonsteroidal anti-inflammatory medications with this such as Advil, Motrin, or Aleve.   Robaxin is the muscle relaxer I have prescribed, this is meant to help with muscle tightness. Be aware that this medication may make you drowsy therefore the first time you take this it should be at a time you are in an environment where you can rest. Do not drive or operate heavy machinery when taking this medication.   Prednisone is the steroid, this will help with pain and inflammation.  In addition you may also take Tylenol. Tylenol is generally safe, though you should not take more than 8 of the extra strength (500mg ) pills a day.  The application of heat can help soothe the pain.    Your pain should get better over the next 2 weeks.  You will need to follow up with  Your primary healthcare provider in 1 week for reassessment, if you do not have a primary care provider one is provided in your discharge instructions. However if you develop severe or worsening pain, low back pain with fever, numbness, weakness, loss of bowel or bladder control, or inability to walk or urinate, you should return to the ER immediately.  Please follow up with your doctor this week for a recheck if still having symptoms.
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
## 2017-08-04 ENCOUNTER — Other Ambulatory Visit: Payer: Self-pay | Admitting: Occupational Medicine

## 2017-08-04 ENCOUNTER — Ambulatory Visit
Admission: RE | Admit: 2017-08-04 | Discharge: 2017-08-04 | Disposition: A | Discharge status: Home / Self Care | Payer: Worker's Compensation | Source: Ambulatory Visit | Attending: Occupational Medicine | Admitting: Occupational Medicine

## 2017-08-04 DIAGNOSIS — M5489 Other dorsalgia: Secondary | ICD-10-CM

## 2018-01-12 ENCOUNTER — Encounter (HOSPITAL_COMMUNITY): Payer: Self-pay | Admitting: Emergency Medicine

## 2018-01-12 ENCOUNTER — Ambulatory Visit (HOSPITAL_COMMUNITY)
Admission: EM | Admit: 2018-01-12 | Discharge: 2018-01-12 | Disposition: A | Discharge status: Home / Self Care | Payer: Medicaid Other | Attending: Family Medicine | Admitting: Family Medicine

## 2018-01-12 DIAGNOSIS — M79675 Pain in left toe(s): Secondary | ICD-10-CM

## 2018-01-12 MED ORDER — NAPROXEN 500 MG PO TABS: 500.0000 mg | ORAL_TABLET | Freq: Two times a day (BID) | ORAL | 0 refills | Status: DC

## 2018-01-12 NOTE — ED Triage Notes (Signed)
PT reports pain in left second toe that started yesterday. No known injury

## 2018-01-12 NOTE — Discharge Instructions (Signed)
Please take Naprosyn twice daily Apply ice, elevate foot Please return if developing increased redness, swelling or pain, pain worsening or not improving in approximately 1 to 2 weeks.

## 2018-01-14 ENCOUNTER — Emergency Department (HOSPITAL_COMMUNITY)
Admission: EM | Admit: 2018-01-14 | Discharge: 2018-01-14 | Disposition: A | Discharge status: Home / Self Care | Payer: Medicaid Other | Attending: Emergency Medicine | Admitting: Emergency Medicine

## 2018-01-14 ENCOUNTER — Encounter (HOSPITAL_COMMUNITY): Payer: Self-pay | Admitting: Emergency Medicine

## 2018-01-14 ENCOUNTER — Emergency Department (HOSPITAL_COMMUNITY): Payer: Medicaid Other

## 2018-01-14 ENCOUNTER — Other Ambulatory Visit: Payer: Self-pay

## 2018-01-14 DIAGNOSIS — Z87891 Personal history of nicotine dependence: Secondary | ICD-10-CM | POA: Insufficient documentation

## 2018-01-14 DIAGNOSIS — M79672 Pain in left foot: Secondary | ICD-10-CM | POA: Diagnosis present

## 2018-01-14 DIAGNOSIS — J45909 Unspecified asthma, uncomplicated: Secondary | ICD-10-CM | POA: Insufficient documentation

## 2018-01-14 DIAGNOSIS — Z91040 Latex allergy status: Secondary | ICD-10-CM | POA: Insufficient documentation

## 2018-01-14 DIAGNOSIS — Z79899 Other long term (current) drug therapy: Secondary | ICD-10-CM | POA: Diagnosis not present

## 2018-01-14 DIAGNOSIS — Z23 Encounter for immunization: Secondary | ICD-10-CM | POA: Diagnosis not present

## 2018-01-14 DIAGNOSIS — L03119 Cellulitis of unspecified part of limb: Secondary | ICD-10-CM

## 2018-01-14 DIAGNOSIS — L03116 Cellulitis of left lower limb: Secondary | ICD-10-CM | POA: Diagnosis not present

## 2018-01-14 MED ORDER — TETANUS-DIPHTH-ACELL PERTUSSIS 5-2.5-18.5 LF-MCG/0.5 IM SUSP
0.5000 mL | Freq: Once | INTRAMUSCULAR | Status: AC
  Administered 2018-01-14: 0.5 mL via INTRAMUSCULAR
  Filled 2018-01-14: qty 0.5

## 2018-01-14 MED ORDER — CEPHALEXIN 500 MG PO CAPS: 500.0000 mg | ORAL_CAPSULE | Freq: Four times a day (QID) | ORAL | 0 refills | Status: AC

## 2018-01-14 NOTE — ED Provider Notes (Signed)
[facility]-EMERGENCY DEPT Provider Note   CSN: [id number] Arrival date & time: 01/14/18  2021     History   Chief Complaint No chief complaint on file.   HPI Megan Valenzuela is a 37 y.o. female.  37 year old female presents with complaint of left foot pain and swelling.  Patient states she has pain at the base of her left first and second MTPs, sole of the foot and feels like the foot is swollen and questionably red.  Patient thinks that she may have stepped on a nail at home but is uncertain if this was within the past week or month, she is unable to give a timeline for the possible injury.  Unsure of her last tetanus shot was within the last 5 years.  No history of prior injuries to this foot otherwise.  No other injuries, complaints or concerns.     Past Medical History:  Diagnosis Date  . Asthma   . Dental abscess     There are no active problems to display for this patient.   Past Surgical History:  Procedure Laterality Date  . TYMPANOSTOMY TUBE PLACEMENT       OB History   None      Home Medications    Prior to Admission medications   Medication Sig Start Date End Date Taking? Authorizing Provider  cephALEXin (KEFLEX) 500 MG capsule Take 1 capsule (500 mg total) by mouth 4 (four) times daily for 7 days. 01/14/18 01/21/18  Jeannie FendMurphy, Henry Utsey A, PA-C  naproxen (NAPROSYN) 500 MG tablet Take 1 tablet (500 mg total) by mouth 2 (two) times daily. 01/12/18   Wieters, Hallie C, PA-C  omeprazole (PRILOSEC) 20 MG capsule Take 1 capsule (20 mg total) by mouth daily. 06/30/17   Tegeler, Canary Brimhristopher J, MD    Family History Family History  Problem Relation Age of Onset  . Cancer Maternal Grandmother     Social History Social History   Tobacco Use  . Smoking status: Former Games developermoker  . Smokeless tobacco: Never Used  Substance Use Topics  . Alcohol use: No  . Drug use: No     Allergies   Flagyl [metronidazole] and Latex   Review of  Systems Review of Systems  Constitutional: Negative for fever.  Musculoskeletal: Positive for arthralgias and joint swelling.  Skin: Positive for color change. Negative for wound.  Allergic/Immunologic: Negative for immunocompromised state.  Neurological: Negative for weakness and numbness.  Hematological: Negative for adenopathy. Does not bruise/bleed easily.  Psychiatric/Behavioral: Negative for confusion.  All other systems reviewed and are negative.    Physical Exam Updated Vital Signs BP (!) 131/95 (BP Location: Left Arm)   Pulse 99   Temp 99.2 F (37.3 C) (Oral)   Resp 16   LMP 12/22/2017   SpO2 99%   Physical Exam  Constitutional: She is oriented to person, place, and time. She appears well-developed and well-nourished. No distress.  HENT:  Head: Normocephalic and atraumatic.  Cardiovascular: Intact distal pulses.  Pulmonary/Chest: Effort normal.  Musculoskeletal:       Feet:  Neurological: She is alert and oriented to person, place, and time.  Skin: Skin is warm and dry. She is not diaphoretic.  Psychiatric: She has a normal mood and affect. Her behavior is normal.  Nursing note and vitals reviewed.    ED Treatments / Results  Labs (all labs ordered are listed, but only abnormal results are displayed) Labs Reviewed - No data to display  EKG None  Radiology  Dg Foot Complete Left  Result Date: 01/14/2018 CLINICAL DATA:  Left foot pain.  Question of stepping on a nail. EXAM: LEFT FOOT - COMPLETE 3+ VIEW COMPARISON:  None. FINDINGS: No fracture or dislocation. No suspicious focal osseous lesion. Tiny Achilles left calcaneal spur. No significant arthropathy. Small sharply marginated calcifications in the plantar soft tissues just medial to the second metatarsal head, presumably accessory sesamoids. Otherwise no radiopaque foreign body. IMPRESSION: No fracture or dislocation. Small sharply marginated calcifications in the plantar soft tissues just medial to the  second metatarsal head, presumably accessory sesamoids, correlate with clinical exam. Otherwise no radiopaque foreign body. Electronically Signed   By: Delbert Phenix M.D.   On: 01/14/2018 21:08    Procedures Procedures (including critical care time)  Medications Ordered in ED Medications  Tdap (BOOSTRIX) injection 0.5 mL (0.5 mLs Intramuscular Given 01/14/18 2113)     Initial Impression / Assessment and Plan / ED Course  I have reviewed the triage vital signs and the nursing notes.  Pertinent labs & imaging results that were available during my care of the patient were reviewed by me and considered in my medical decision making (see chart for details).  Clinical Course as of Jan 15 2124  Wed Jan 14, 2018  [id number] 37 year old female presents with complaint of left foot pain.  Patient was seen in urgent care with no diagnosis made given naproxen which is not helping.  Patient states pain started 3 days ago, she gives a vague history of may be having stepped on a nail at home at some time in the past but is unable to say when that would have happened.  There is no obvious wounds to the foot, x-ray is negative for foreign body, does show sesamoid bones in the area of her pain.  Due to the mild swelling, moderate tenderness palpation and questionable erythema to the dorsum of her left foot she will be placed on Keflex, referred to podiatry for follow-up.  She requests update of her Tdap as she is uncertain when the last one was and she may have stepped on a nail.   [LM]    Clinical Course User Index [LM] Jeannie Fend, PA-C    Final Clinical Impressions(s) / ED Diagnoses   Final diagnoses:  Left foot pain  Cellulitis of foot    ED Discharge Orders        Ordered    cephALEXin (KEFLEX) 500 MG capsule  4 times daily     01/14/18 2123       Jeannie Fend, PA-C 01/14/18 2125    Jacalyn Lefevre, MD 01/14/18 2240

## 2018-01-14 NOTE — ED Triage Notes (Signed)
Pt with pain in left foot x3 days. No known injury. Pt ambulatory. Pt reports swelling but none noted by this RN. No deformity. Pedal pulse felt

## 2018-01-14 NOTE — Discharge Instructions (Signed)
Warm Epson salt soaks for 20 minutes at least 3 times a day. Take Keflex as prescribed and complete the full course. Follow-up with podiatry if symptoms are not improving.

## 2018-01-14 NOTE — ED Provider Notes (Signed)
[facility]    CSN: [id number] Arrival date & time: 01/12/18  1928     History   Chief Complaint Chief Complaint  Patient presents with  . Toe Pain    HPI Megan SparkKeonte Nashay Valenzuela is a 37 y.o. female history of asthma presenting today for evaluation of left second toe pain.  Patient denies any injury.  She states that she started to develop pain yesterday which has persisted until today.  She noticed that after she took off her work boots.  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 she denies anything running over her toe or dropping any weights or anything on her toe.  She has not taken any medicines for this.  HPI  Past Medical History:  Diagnosis Date  . Asthma   . Dental abscess     There are no active problems to display for this patient.   Past Surgical History:  Procedure Laterality Date  . TYMPANOSTOMY TUBE PLACEMENT      OB History   None      Home Medications    Prior to Admission medications   Medication Sig Start Date End Date Taking? Authorizing Provider  naproxen (NAPROSYN) 500 MG tablet Take 1 tablet (500 mg total) by mouth 2 (two) times daily. 01/12/18   Avyukth Bontempo C, PA-C  omeprazole (PRILOSEC) 20 MG capsule Take 1 capsule (20 mg total) by mouth daily. 06/30/17   Tegeler, Canary Brimhristopher J, MD    Family History Family History  Problem Relation Age of Onset  . Cancer Maternal Grandmother     Social History Social History   Tobacco Use  . Smoking status: Former Games developermoker  . Smokeless tobacco: Never Used  Substance Use Topics  . Alcohol use: No  . Drug use: No     Allergies   Flagyl [metronidazole] and Latex   Review of Systems Review of Systems  Constitutional: Negative for fatigue and fever.  Respiratory: Negative for shortness of breath.   Cardiovascular: Negative for chest pain.  Gastrointestinal: Negative for nausea and vomiting.  Musculoskeletal: Positive for arthralgias, gait problem and  myalgias.  Skin: Negative for color change, pallor and wound.  Neurological: Negative for weakness, numbness and headaches.     Physical Exam Triage Vital Signs ED Triage Vitals  Enc Vitals Group     BP 01/12/18 2010 (!) 141/95     Pulse Rate 01/12/18 2009 74     Resp 01/12/18 2009 16     Temp 01/12/18 2009 98.6 F (37 C)     Temp Source 01/12/18 2009 Oral     SpO2 01/12/18 2009 99 %     Weight 01/12/18 2009 268 lb (121.6 kg)     Height --      Head Circumference --      Peak Flow --      Pain Score 01/12/18 2009 8     Pain Loc --      Pain Edu? --      Excl. in GC? --    No data found.  Updated Vital Signs BP (!) 141/95   Pulse 74   Temp 98.6 F (37 C) (Oral)   Resp 16   Wt 268 lb (121.6 kg)   LMP 12/22/2017   SpO2 99%   BMI 40.75 kg/m   Visual Acuity Right Eye Distance:   Left Eye Distance:   Bilateral Distance:    Right Eye Near:   Left Eye Near:  Bilateral Near:     Physical Exam  Constitutional: She is oriented to person, place, and time. She appears well-developed and well-nourished.  No acute distress  HENT:  Head: Normocephalic and atraumatic.  Nose: Nose normal.  Eyes: Conjunctivae are normal.  Neck: Neck supple.  Cardiovascular: Normal rate.  Pulmonary/Chest: Effort normal. No respiratory distress.  Abdominal: She exhibits no distension.  Musculoskeletal: Normal range of motion.  No obvious redness, swelling or deformity to left second toe, no increased warmth with touch, tenderness with movement of toe  Ambulation with slight antalgia  Neurological: She is alert and oriented to person, place, and time.  Skin: Skin is warm and dry.  Psychiatric: She has a normal mood and affect.  Nursing note and vitals reviewed.    UC Treatments / Results  Labs (all labs ordered are listed, but only abnormal results are displayed) Labs Reviewed - No data to display  EKG None  Radiology No results found.  Procedures Procedures (including  critical care time)  Medications Ordered in UC Medications - No data to display  Initial Impression / Assessment and Plan / UC Course  I have reviewed the triage vital signs and the nursing notes.  Pertinent labs & imaging results that were available during my care of the patient were reviewed by me and considered in my medical decision making (see chart for details).     Patient with toe pain, does not appear to be gout, possible unknowing injury.  Recommend conservative treatment at this time and defer imaging, anti-inflammatories icing and elevation.Discussed strict return precautions. Patient verbalized understanding and is agreeable with plan.  Final Clinical Impressions(s) / UC Diagnoses   Final diagnoses:  Toe pain, left     Discharge Instructions     Please take Naprosyn twice daily Apply ice, elevate foot Please return if developing increased redness, swelling or pain, pain worsening or not improving in approximately 1 to 2 weeks.   ED Prescriptions    Medication Sig Dispense Auth. Provider   naproxen (NAPROSYN) 500 MG tablet Take 1 tablet (500 mg total) by mouth 2 (two) times daily. 30 tablet Jesenia Spera, [place] C, PA-C     Controlled Substance Prescriptions [place] Controlled Substance Registry consulted? Not Applicable   Lew Dawes, [place] 01/14/18 [phone]

## 2018-01-15 ENCOUNTER — Encounter (HOSPITAL_COMMUNITY): Payer: Self-pay | Admitting: Emergency Medicine

## 2018-01-15 ENCOUNTER — Emergency Department (HOSPITAL_COMMUNITY)
Admission: EM | Admit: 2018-01-15 | Discharge: 2018-01-15 | Disposition: A | Discharge status: Home / Self Care | Payer: Medicaid Other | Attending: Emergency Medicine | Admitting: Emergency Medicine

## 2018-01-15 DIAGNOSIS — Z87891 Personal history of nicotine dependence: Secondary | ICD-10-CM | POA: Diagnosis not present

## 2018-01-15 DIAGNOSIS — M79672 Pain in left foot: Secondary | ICD-10-CM | POA: Diagnosis not present

## 2018-01-15 DIAGNOSIS — Z91040 Latex allergy status: Secondary | ICD-10-CM | POA: Diagnosis not present

## 2018-01-15 DIAGNOSIS — J45909 Unspecified asthma, uncomplicated: Secondary | ICD-10-CM | POA: Insufficient documentation

## 2018-01-15 DIAGNOSIS — Z79899 Other long term (current) drug therapy: Secondary | ICD-10-CM | POA: Insufficient documentation

## 2018-01-15 NOTE — ED Notes (Signed)
ED Provider at bedside. 

## 2018-01-15 NOTE — ED Triage Notes (Signed)
Pt has left foot pain X5 days.  No known injury, took two doses of antibiotic that was [facility] by [facility].  PA in triage

## 2018-01-15 NOTE — ED Notes (Signed)
Patient verbalizes understanding of discharge instructions. Opportunity for questioning and answers were provided. Armband removed by staff, pt discharged from ED ambulatory.   

## 2018-01-15 NOTE — ED Provider Notes (Signed)
Patient placed in Quick Look pathway, seen and evaluated   Chief Complaint: Left foot pain  HPI:   Patient was seen yesterday at  for the same, they felt like she may have cellulitis and started her on keflex.  She took two pills and feels like it is still getting worse.  She reports increased pain.    ROS: No fevers.   Physical Exam:   Gen: No distress  Neuro: Awake and Alert  Skin: Warm    Focused Exam: No obvious deformity to foot.     Initiation of care has begun. The patient has been counseled on the process, plan, and necessity for staying for the completion/evaluation, and the remainder of the medical screening examination    Norman ClayHammond, Debhora Titus W, PA-C 01/15/18 2052    Charlynne PanderYao, David Hsienta, MD 01/15/18 2352

## 2018-01-15 NOTE — Discharge Instructions (Addendum)
Please continue to take your antibiotic, Keflex, as prescribed. You may take your anti-inflammatory medication, naproxen, as prescribed during your previous visit and needed for pain. Please return to the emergency department for any new or worsening symptoms. Please follow-up with your primary care provider regarding your visit today, you may call to establish [facility] if you do not have one already.  Contact a health care provider if: Your pain does not get better after a few days of self-care. Your pain gets worse. You cannot stand on your foot. Get help right away if: Your foot is numb or tingling. Your foot or toes are swollen. Your foot or toes turn white or blue. You have warmth and redness along your foot.

## 2018-01-15 NOTE — ED Provider Notes (Signed)
[facility] EMERGENCY DEPARTMENT Provider Note   CSN: [id number] Arrival date & time: 01/15/18  2042     History   Chief Complaint Chief Complaint  Patient presents with  . Foot Injury    HPI Megan Valenzuela is a 37 y.o. female presenting for pain and swelling to her left foot and ankle.  Patient states that pain and swelling began approximately a week ago after she felt a pop in her foot while walking.  Patient was seen at urgent care and prescribed naproxen. Patient states that pain has stayed the same since, and she states that she has not taken her naproxen. Patient was seen at [facility] yesterday for the same foot pain and was given Keflex for possible cellulitis, patient states that she thinks she might have stepped on a nail but is uncertain of this.  Patient was treated yesterday with a tetanus shot as well as the Keflex. Patient had radiographs done of her left foot yesterday, no acute findings.  Patient states that she is concerned that her swelling and pain have not improved since yesterday, she states that she has taken her Keflex but has not taken her anti-inflammatory medication, naproxen.  Patient denies new injury to her foot.  Denies history of fever, nausea, vomiting, numbness or weakness or tingling.  Patient denies new injury.  HPI  Past Medical History:  Diagnosis Date  . Asthma   . Dental abscess     There are no active problems to display for this patient.   Past Surgical History:  Procedure Laterality Date  . TYMPANOSTOMY TUBE PLACEMENT       OB History   None      Home Medications    Prior to Admission medications   Medication Sig Start Date End Date Taking? Authorizing Provider  cephALEXin (KEFLEX) 500 MG capsule Take 1 capsule (500 mg total) by mouth 4 (four) times daily for 7 days. 01/14/18 01/21/18  Jeannie Fend, PA-C  naproxen (NAPROSYN) 500 MG tablet Take 1 tablet (500 mg total) by mouth 2 (two) times daily.  01/12/18   Wieters, Hallie C, PA-C  omeprazole (PRILOSEC) 20 MG capsule Take 1 capsule (20 mg total) by mouth daily. 06/30/17   Tegeler, Canary Brim, MD    Family History Family History  Problem Relation Age of Onset  . Cancer Maternal Grandmother     Social History Social History   Tobacco Use  . Smoking status: Former Games developer  . Smokeless tobacco: Never Used  Substance Use Topics  . Alcohol use: No  . Drug use: No     Allergies   Flagyl [metronidazole] and Latex   Review of Systems Review of Systems  Constitutional: Negative.  Negative for fever.  HENT: Negative.   Eyes: Negative.  Negative for visual disturbance.  Respiratory: Negative.  Negative for cough and shortness of breath.   Cardiovascular: Negative.  Negative for chest pain.  Gastrointestinal: Negative.  Negative for abdominal pain, diarrhea, nausea and vomiting.  Genitourinary: Negative.   Musculoskeletal: Positive for arthralgias and joint swelling. Negative for back pain, neck pain and neck stiffness.  Skin: Negative.   Neurological: Negative.  Negative for dizziness, syncope, weakness, light-headedness, numbness and headaches.     Physical Exam Updated Vital Signs BP 130/86 (BP Location: Right Arm)   Pulse 80   Temp 98.7 F (37.1 C) (Oral)   Resp 16   LMP 12/22/2017   SpO2 98%   Physical Exam  Constitutional: She is  oriented to person, place, and time. She appears well-developed and well-nourished. No distress.  HENT:  Head: Normocephalic and atraumatic.  Right Ear: External ear normal.  Left Ear: External ear normal.  Nose: Nose normal.  Eyes: Pupils are equal, round, and reactive to light.  Neck: Normal range of motion. Neck supple. No JVD present. No tracheal deviation present.  Cardiovascular: Normal rate and regular rhythm.  Pulses:      Dorsalis pedis pulses are 2+ on the right side, and 2+ on the left side.       Posterior tibial pulses are 2+ on the right side, and 2+ on the left  side.  Pulmonary/Chest: Effort normal and breath sounds normal. No respiratory distress.  Abdominal: Soft. There is no tenderness. There is no rebound and no guarding.  Musculoskeletal: Normal range of motion. She exhibits tenderness. She exhibits no deformity.       Right ankle: Normal. She exhibits no swelling. No tenderness.       Left ankle: She exhibits swelling. Tenderness.       Right foot: There is normal range of motion and no deformity.       Left foot: There is tenderness. There is normal range of motion.  Patient with moderate amount of left ankle swelling.  No bony tenderness or deformity. Patient with normal-appearing left foot, no signs suggesting infection, no erythema, streaking, warmth, wound, drainage.  Patient is mildly tender around the whole dorsal aspect of her left foot.  Patient is also mildly tender to the left ankle.  No obvious signs of deformity to the foot.  Patient moving the ankle and all of her toes well.  Pedal pulses intact bilaterally.  Patient with full sensation to her feet bilaterally cap refill intact to her lower extremity's bilaterally.  Feet:  Right Foot:  Protective Sensation: 3 sites tested. 3 sites sensed.  Skin Integrity: Negative for erythema or warmth.  Left Foot:  Protective Sensation: 3 sites tested. 3 sites sensed.  Skin Integrity: Negative for erythema or warmth.  Neurological: She is alert and oriented to person, place, and time. No sensory deficit.  Skin: Skin is warm and dry. Capillary refill takes less than 2 seconds.  Psychiatric: She has a normal mood and affect. Her behavior is normal.     ED Treatments / Results  Labs (all labs ordered are listed, but only abnormal results are displayed) Labs Reviewed - No data to display  EKG None  Radiology Dg Foot Complete Left  Result Date: 01/14/2018 CLINICAL DATA:  Left foot pain.  Question of stepping on a nail. EXAM: LEFT FOOT - COMPLETE 3+ VIEW COMPARISON:  None. FINDINGS: No  fracture or dislocation. No suspicious focal osseous lesion. Tiny Achilles left calcaneal spur. No significant arthropathy. Small sharply marginated calcifications in the plantar soft tissues just medial to the second metatarsal head, presumably accessory sesamoids. Otherwise no radiopaque foreign body. IMPRESSION: No fracture or dislocation. Small sharply marginated calcifications in the plantar soft tissues just medial to the second metatarsal head, presumably accessory sesamoids, correlate with clinical exam. Otherwise no radiopaque foreign body. Electronically Signed   By: Delbert PhenixJason A Poff M.D.   On: 01/14/2018 21:08    Procedures Procedures (including critical care time)  Medications Ordered in ED Medications - No data to display   Initial Impression / Assessment and Plan / ED Course  I have reviewed the triage vital signs and the nursing notes.  Pertinent labs & imaging results that were available during my care  of the patient were reviewed by me and considered in my medical decision making (see chart for details).  Patient presenting for pain and swelling of the left foot.  No new change since her last ED visit.  I have encouraged the patient to take her naproxen as previously prescribed, 1 tablet 500 mg p.o twice daily.  Patient denies history of gastric ulcers or kidney disease.  Patient states that she will begin using her naproxen as prescribed for her pain.  I have ordered the patient an ASO ankle brace in department.  Ankle brace applied by nursing staff, patient states that the ankle brace fits well and is comfortable.  There are no signs of infection at this time however I have encouraged the patient to finish her course of antibiotics.  I have encouraged the patient to use rest, ice, compression and elevation to help with her pain and swelling.  Patient denies new injury since yesterday, no imaging indicated at this time.  I have encouraged patient to follow-up with her primary care  provider, I have given her referral to [facility].  At this time there does not appear to be any evidence of an acute emergency medical condition and the patient appears stable for discharge with appropriate outpatient follow up. Diagnosis was discussed with patient who verbalizes understanding and is agreeable to discharge. I have discussed return precautions with patients who verbalizes understanding of return precautions. Patient strongly encouraged to follow-up with their PCP.   This note was dictated using DragonOne dictation software; please contact for any inconsistencies within the note.   Final Clinical Impressions(s) / ED Diagnoses   Final diagnoses:  Left foot pain    ED Discharge Orders    None       Elizabeth Palau 01/16/18 0214    Wynetta Fines, MD 01/16/18 [phone]

## 2018-01-17 ENCOUNTER — Encounter (HOSPITAL_COMMUNITY): Payer: Self-pay

## 2018-01-17 ENCOUNTER — Encounter (HOSPITAL_COMMUNITY): Payer: Self-pay | Admitting: Emergency Medicine

## 2018-01-17 ENCOUNTER — Ambulatory Visit (HOSPITAL_COMMUNITY)
Admission: EM | Admit: 2018-01-17 | Discharge: 2018-01-17 | Disposition: A | Discharge status: Home / Self Care | Payer: Medicaid Other | Attending: Internal Medicine | Admitting: Internal Medicine

## 2018-01-17 ENCOUNTER — Emergency Department (HOSPITAL_COMMUNITY)
Admission: EM | Admit: 2018-01-17 | Discharge: 2018-01-17 | Discharge status: Against Medical Advice | Payer: Medicaid Other | Attending: Emergency Medicine | Admitting: Emergency Medicine

## 2018-01-17 DIAGNOSIS — R21 Rash and other nonspecific skin eruption: Secondary | ICD-10-CM | POA: Insufficient documentation

## 2018-01-17 DIAGNOSIS — R109 Unspecified abdominal pain: Secondary | ICD-10-CM | POA: Insufficient documentation

## 2018-01-17 DIAGNOSIS — Z91040 Latex allergy status: Secondary | ICD-10-CM | POA: Diagnosis not present

## 2018-01-17 DIAGNOSIS — M79672 Pain in left foot: Secondary | ICD-10-CM | POA: Insufficient documentation

## 2018-01-17 DIAGNOSIS — Z87891 Personal history of nicotine dependence: Secondary | ICD-10-CM | POA: Diagnosis not present

## 2018-01-17 DIAGNOSIS — J45909 Unspecified asthma, uncomplicated: Secondary | ICD-10-CM | POA: Diagnosis not present

## 2018-01-17 DIAGNOSIS — Z5321 Procedure and treatment not carried out due to patient leaving prior to being seen by health care provider: Secondary | ICD-10-CM | POA: Diagnosis not present

## 2018-01-17 NOTE — ED Triage Notes (Signed)
Pt requesting lab work to be done.

## 2018-01-17 NOTE — ED Provider Notes (Addendum)
[facility]    CSN: [id number] Arrival date & time: 01/17/18  1239     History   Chief Complaint Chief Complaint  Patient presents with  . Foot Pain    HPI Megan Valenzuela is a 37 y.o. female.   37 y.o. female presents with left foot pain  That occurred when she tripped. Per Epic notes patient was seen in ED on 7.10. 19 and 7.11.19  For the same. Patient states "I want you to draw my blood work I think I have an infection." swelling noted ankle and dorsal aspect of foot no eryethema or warmth. Patient states that she has an appointment with orthopedics on Monday.    Condition is acute in nature. Condition is made better by nohting. Condition is made worse by weight bearing activities. Patient denies any relief from naproxen prior to there arrival at this facility.   Upon discharge patient states that she received a hematoma anterior aspect of left forearm from "where I had the ice pack" patient states that "I looked it up and I know it is a bleeding disorder from the keflex" Patient esclating while on the telephone. Patient states that she wants a referral to othopedics "as I have an appoitment with foot doctor and that is different"  Attempt to educate patient on bleeding disorders futile. Patient states that she wants to know the name of the blood test for bleeding. Patient states she wants to go to the ED.  Agreed to give patient referral to orthopedics while she finished her phone call.   Per RN patient left evaluation room. Patient then left the exam room stating she was going to the ED.      Past Medical History:  Diagnosis Date  . Asthma   . Dental abscess     There are no active problems to display for this patient.   Past Surgical History:  Procedure Laterality Date  . TYMPANOSTOMY TUBE PLACEMENT      OB History   None      Home Medications    Prior to Admission medications   Medication Sig Start Date End Date Taking? Authorizing Provider    cephALEXin (KEFLEX) 500 MG capsule Take 1 capsule (500 mg total) by mouth 4 (four) times daily for 7 days. 01/14/18 01/21/18  Jeannie FendMurphy, Laura A, PA-C  naproxen (NAPROSYN) 500 MG tablet Take 1 tablet (500 mg total) by mouth 2 (two) times daily. 01/12/18   Wieters, Hallie C, PA-C  omeprazole (PRILOSEC) 20 MG capsule Take 1 capsule (20 mg total) by mouth daily. 06/30/17   Tegeler, Canary Brimhristopher J, MD    Family History Family History  Problem Relation Age of Onset  . Cancer Maternal Grandmother     Social History Social History   Tobacco Use  . Smoking status: Former Games developermoker  . Smokeless tobacco: Never Used  Substance Use Topics  . Alcohol use: No  . Drug use: No     Allergies   Flagyl [metronidazole] and Latex   Review of Systems Review of Systems  Constitutional: Negative for chills and fever.  HENT: Negative for ear pain and sore throat.   Eyes: Negative for pain and visual disturbance.  Respiratory: Negative for cough and shortness of breath.   Cardiovascular: Negative for chest pain and palpitations.  Gastrointestinal: Negative for abdominal pain and vomiting.  Genitourinary: Negative for dysuria and hematuria.  Musculoskeletal: Negative for arthralgias and back pain.       Left foot pain and swelling  Skin: Negative for color change and rash.  Neurological: Negative for seizures and syncope.  All other systems reviewed and are negative.    Physical Exam Triage Vital Signs ED Triage Vitals [01/17/18 1356]  Enc Vitals Group     BP 118/67     Pulse Rate 78     Resp 16     Temp 98.2 F (36.8 C)     Temp src      SpO2 98 %     Weight      Height      Head Circumference      Peak Flow      Pain Score      Pain Loc      Pain Edu?      Excl. in GC?    No data found.  Updated Vital Signs BP 118/67   Pulse 78   Temp 98.2 F (36.8 C)   Resp 16   LMP 12/22/2017   SpO2 98%   Visual Acuity Right Eye Distance:   Left Eye Distance:   Bilateral Distance:     Right Eye Near:   Left Eye Near:    Bilateral Near:     Physical Exam  Constitutional: She is oriented to person, place, and time. She appears well-developed and well-nourished.  HENT:  Head: Normocephalic and atraumatic.  Eyes: Conjunctivae are normal.  Neck: Normal range of motion.  Pulmonary/Chest: Effort normal.  Musculoskeletal: She exhibits edema and tenderness.  Left foot.   Neurological: She is alert and oriented to person, place, and time.  Psychiatric: She has a normal mood and affect.  Nursing note and vitals reviewed.    UC Treatments / Results  Labs (all labs ordered are listed, but only abnormal results are displayed) Labs Reviewed - No data to display  EKG None  Radiology No results found.  Procedures Procedures (including critical care time)  Medications Ordered in UC Medications - No data to display  Initial Impression / Assessment and Plan / UC Course  I have reviewed the triage vital signs and the nursing notes.  Pertinent labs & imaging results that were available during my care of the patient were reviewed by me and considered in my medical decision making (see chart for details).      Final Clinical Impressions(s) / UC Diagnoses   Final diagnoses:  None   Discharge Instructions   None    ED Prescriptions    None     Controlled Substance Prescriptions [place] Controlled Substance Registry consulted? Not Applicable   Alene Mires, NP 01/17/18 1504    Alene Mires, NP 01/17/18 1513    Alene Mires, NP 01/17/18 1903

## 2018-01-17 NOTE — ED Triage Notes (Signed)
Pt seen at ED and u/c several times for left foot pain.  Pt was given Keflex 01-14-18.  Pt returned to u/c today for continued foot pain and swelling.  While at u/c pt noted a bruise to right wrist area from ice pack.  Thinks this may be from Keflex and Naproxen.

## 2018-01-17 NOTE — ED Triage Notes (Signed)
Pt c/o L foot pain, was seen and treated for it, xrays, tetanus shot, antibiotics. PT states the bottom of her foot Is still tender.

## 2018-01-17 NOTE — ED Notes (Signed)
Patient left prior to receiving discharge equipment/instructions

## 2018-01-17 NOTE — ED Provider Notes (Signed)
[facility]   CSN: [id number] Arrival date & time: 01/17/18  1559     History   Chief Complaint Chief Complaint  Patient presents with  . Foot Pain    HPI Megan Valenzuela is a 37 y.o. female.  HPI  Patient presents today in follow-up for left foot pain.  Reviewed her recent encounters for the same with her.  She states that she thinks she may have stepped on some sort of tack at the door frame either 1 week or greater ago.  She does not really recall this because she states she did not feel any complications immediately thereafter.  She also wonders whether stubbing her toe had caused the pain.  She states that she picked up the Keflex the day after she was prescribed it from the emergency room, and has taken it until 10 AM this morning.  She states that she feels she is now allergic to this secondary to abdominal pain and easy bruising.  She states she has tried her naproxen and still has significant pain.  States she is not able to walk normally. She is worried that she might need blood work or admission. Past Medical History:  Diagnosis Date  . Asthma   . Dental abscess     There are no active problems to display for this patient.   Past Surgical History:  Procedure Laterality Date  . TYMPANOSTOMY TUBE PLACEMENT       OB History   None      Home Medications    Prior to Admission medications   Medication Sig Start Date End Date Taking? Authorizing Provider  cephALEXin (KEFLEX) 500 MG capsule Take 1 capsule (500 mg total) by mouth 4 (four) times daily for 7 days. 01/14/18 01/21/18 Yes Jeannie FendMurphy, Laura A, PA-C  naproxen (NAPROSYN) 500 MG tablet Take 1 tablet (500 mg total) by mouth 2 (two) times daily. 01/12/18  Yes Wieters, Junius CreamerHallie C, PA-C    Family History Family History  Problem Relation Age of Onset  . Cancer Maternal Grandmother     Social History Social History   Tobacco Use  . Smoking status: Former  Games developermoker  . Smokeless tobacco: Never Used  Substance Use Topics  . Alcohol use: No  . Drug use: No     Allergies   Flagyl [metronidazole]; Keflex [cephalexin]; and Latex   Review of Systems Review of Systems  Constitutional: Negative for activity change and fever.  Gastrointestinal: Positive for abdominal pain.  Hematological: Bruises/bleeds easily.  All other systems reviewed and are negative.    Physical Exam Updated Vital Signs BP 109/83 (BP Location: Right Arm)   Pulse 79   Temp 98.6 F (37 C) (Oral)   Resp 20   LMP 12/22/2017   SpO2 98%   Physical Exam  Constitutional: She appears well-developed and well-nourished. No distress.  HENT:  Head: Atraumatic.  Cardiovascular: Normal rate, regular rhythm and normal heart sounds.  Pulmonary/Chest: Effort normal and breath sounds normal.  Abdominal: Soft. Bowel sounds are normal. She exhibits no distension.  Musculoskeletal: Normal range of motion.  L sole of foot with 5x4 cm area of redness and warmth.  No fluctuance or induration.  No wound over entire foot.  This area is tender to palpation.     ED Treatments / Results  Labs (all labs ordered are listed, but only abnormal results are displayed) Labs Reviewed - No data to display  EKG None  Radiology No results  found.  Procedures Procedures (including critical care time)  Medications Ordered in ED Medications - No data to display   Initial Impression / Assessment and Plan / ED Course  I have reviewed the triage vital signs and the nursing notes.  Pertinent labs & imaging results that were available during my care of the patient were reviewed by me and considered in my medical decision making (see chart for details).     Exam with 5 x 4 cm area of erythema, no induration or abscess.  Mildly warm, tender to palpation.  No wound or overlying rash.  Exam consistent with possible cellulitis, Keflex should be appropriate.  Was considering change to Clinda or  Doxy as patient reports intolerance to Keflex, however patient eloped from the emergency room prior to this discussion.  Final Clinical Impressions(s) / ED Diagnoses   Final diagnoses:  Foot pain, left    ED Discharge Orders    None       Garth Bigness, MD 01/17/18 Clarisa Fling    Gerhard Munch, MD 01/18/18 0001

## 2018-01-31 ENCOUNTER — Encounter (HOSPITAL_COMMUNITY): Payer: Self-pay | Admitting: Emergency Medicine

## 2018-01-31 ENCOUNTER — Emergency Department (HOSPITAL_COMMUNITY)
Admission: EM | Admit: 2018-01-31 | Discharge: 2018-01-31 | Disposition: A | Discharge status: Home / Self Care | Payer: Medicaid Other | Attending: Emergency Medicine | Admitting: Emergency Medicine

## 2018-01-31 DIAGNOSIS — R51 Headache: Secondary | ICD-10-CM | POA: Diagnosis not present

## 2018-01-31 DIAGNOSIS — R5383 Other fatigue: Secondary | ICD-10-CM | POA: Diagnosis present

## 2018-01-31 DIAGNOSIS — Z91040 Latex allergy status: Secondary | ICD-10-CM | POA: Insufficient documentation

## 2018-01-31 DIAGNOSIS — J45909 Unspecified asthma, uncomplicated: Secondary | ICD-10-CM | POA: Diagnosis not present

## 2018-01-31 DIAGNOSIS — Z87891 Personal history of nicotine dependence: Secondary | ICD-10-CM | POA: Diagnosis not present

## 2018-01-31 DIAGNOSIS — R519 Headache, unspecified: Secondary | ICD-10-CM

## 2018-01-31 MED ORDER — ACETAMINOPHEN 325 MG PO TABS
650.0000 mg | ORAL_TABLET | Freq: Once | ORAL | Status: DC
  Filled 2018-01-31: qty 2

## 2018-01-31 MED ORDER — ACETAMINOPHEN 500 MG PO TABS: 500.0000 mg | ORAL_TABLET | Freq: Four times a day (QID) | ORAL | 0 refills | Status: DC | PRN

## 2018-01-31 NOTE — ED Triage Notes (Signed)
Pt presents with multiple complaints. States she thinks she is dehydrated. Reports HA, fatigue, dark urine, etc.

## 2018-01-31 NOTE — ED Provider Notes (Signed)
[facility]   CSN: [id number] Arrival date & time: 01/31/18  0443     History   Chief Complaint Chief Complaint  Patient presents with  . Multiple Complaints    HPI Megan Valenzuela is a 37 y.o. female.  The history is provided by the patient and medical records. No language interpreter was used.     37 year old female presenting with multiple complaints.  Patient report for the past 2 to 3 days she has had generalized fatigue, mild throbbing frontal headache, feeling dehydrated, and mentioned her urine is darker than usual.  She denies having fever, URI symptoms, sore throat, trouble swallowing, neck pain, chest pain, shortness of breath, abdominal cramping, dysuria, nausea vomiting or diarrhea.  She is still eating and drinking fine.  She denies any recent travel.  She recently came off from her menstruation.  It was normal and not prolonged.  No specific treatment tried at home.  No recent sick contacts.  Past Medical History:  Diagnosis Date  . Asthma   . Dental abscess     There are no active problems to display for this patient.   Past Surgical History:  Procedure Laterality Date  . TYMPANOSTOMY TUBE PLACEMENT       OB History   None      Home Medications    Prior to Admission medications   Medication Sig Start Date End Date Taking? Authorizing Provider  naproxen (NAPROSYN) 500 MG tablet Take 1 tablet (500 mg total) by mouth 2 (two) times daily. Patient not taking: Reported on 01/31/2018 01/12/18   Lew DawesWieters, Hallie C, PA-C    Family History Family History  Problem Relation Age of Onset  . Cancer Maternal Grandmother     Social History Social History   Tobacco Use  . Smoking status: Former Games developermoker  . Smokeless tobacco: Never Used  Substance Use Topics  . Alcohol use: No  . Drug use: No     Allergies   Flagyl [metronidazole]; Keflex [cephalexin]; and Latex   Review of Systems Review of  Systems  All other systems reviewed and are negative.    Physical Exam Updated Vital Signs BP (!) 146/87 (BP Location: Right Wrist)   Pulse 71   Temp 98 F (36.7 C) (Oral)   Resp 18   Ht 5\' 6"  (1.676 m)   Wt 117.9 kg (260 lb)   SpO2 100%   BMI 41.97 kg/m   Physical Exam  Constitutional: She is oriented to person, place, and time. She appears well-developed and well-nourished. No distress.  Patient is well-appearing, ambulate without difficulty, urinating without difficulty and in no acute distress.  HENT:  Head: Atraumatic.  In nose and throat unremarkable, oral mucosa moist.  Eyes: Conjunctivae are normal.  Neck: Normal range of motion. Neck supple.  No nuchal rigidity  Cardiovascular: Normal rate and regular rhythm.  Pulmonary/Chest: Effort normal and breath sounds normal.  Abdominal: Soft. Bowel sounds are normal. There is no tenderness.  Musculoskeletal:  5 out of 5 strength to all 4 extremities  Neurological: She is alert and oriented to person, place, and time. GCS eye subscore is 4. GCS verbal subscore is 5. GCS motor subscore is 6.  Skin: No rash noted.  Normal skin turgor  Psychiatric: She has a normal mood and affect.  Nursing note and vitals reviewed.    ED Treatments / Results  Labs (all labs ordered are listed, but only abnormal results are displayed) Labs Reviewed -  No data to display  EKG None  Radiology No results found.  Procedures Procedures (including critical care time)  Medications Ordered in ED Medications - No data to display   Initial Impression / Assessment and Plan / ED Course  I have reviewed the triage vital signs and the nursing notes.  Pertinent labs & imaging results that were available during my care of the patient were reviewed by me and considered in my medical decision making (see chart for details).     BP 121/81   Pulse 62   Temp 98 F (36.7 C) (Oral)   Resp 18   Ht 5\' 6"  (1.676 m)   Wt 117.9 kg (260 lb)    SpO2 100%   BMI 41.97 kg/m    Final Clinical Impressions(s) / ED Diagnoses   Final diagnoses:  Bad headache    ED Discharge Orders        Ordered    acetaminophen (TYLENOL) 500 MG tablet  Every 6 hours PRN     01/31/18 0638     6:21 AM Patient with multiple complaints including headache, feeling dehydrated.  She is well-appearing, in no acute discomfort and no evidence to suggest dehydration.  Her urine cup in the room is pale.  She has no tenderness on exam.  No signs to suggest meningitis.  Reassurance given, Tylenol given for headache.  Patient will be discharged, return precautions given she voiced understanding and agrees to plan.   Fayrene Helper, PA-C 01/31/18 1610    Dione Booze, MD 01/31/18 [phone]

## 2018-01-31 NOTE — Discharge Instructions (Signed)
Please stay hydrated by drinking plenty of water.  Take Tylenol as needed for your headache.  Return if you develop high fever, persistent nausea vomiting or diarrhea or if you have any other concerns.  Otherwise follow-up with your primary care provider for further management of your health.

## 2018-05-23 DIAGNOSIS — Z87891 Personal history of nicotine dependence: Secondary | ICD-10-CM | POA: Diagnosis not present

## 2018-05-23 DIAGNOSIS — R102 Pelvic and perineal pain: Secondary | ICD-10-CM | POA: Diagnosis not present

## 2018-05-23 DIAGNOSIS — N898 Other specified noninflammatory disorders of vagina: Secondary | ICD-10-CM | POA: Diagnosis not present

## 2018-05-23 DIAGNOSIS — J45909 Unspecified asthma, uncomplicated: Secondary | ICD-10-CM | POA: Insufficient documentation

## 2018-05-23 DIAGNOSIS — Z202 Contact with and (suspected) exposure to infections with a predominantly sexual mode of transmission: Secondary | ICD-10-CM | POA: Diagnosis not present

## 2018-05-23 DIAGNOSIS — N760 Acute vaginitis: Secondary | ICD-10-CM | POA: Diagnosis not present

## 2018-05-24 ENCOUNTER — Emergency Department (HOSPITAL_COMMUNITY)
Admission: EM | Admit: 2018-05-24 | Discharge: 2018-05-24 | Disposition: A | Discharge status: Home / Self Care | Payer: Medicaid Other | Attending: Emergency Medicine | Admitting: Emergency Medicine

## 2018-05-24 ENCOUNTER — Other Ambulatory Visit: Payer: Self-pay

## 2018-05-24 ENCOUNTER — Encounter (HOSPITAL_COMMUNITY): Payer: Self-pay | Admitting: Emergency Medicine

## 2018-05-24 DIAGNOSIS — N898 Other specified noninflammatory disorders of vagina: Secondary | ICD-10-CM

## 2018-05-24 DIAGNOSIS — B9689 Other specified bacterial agents as the cause of diseases classified elsewhere: Secondary | ICD-10-CM

## 2018-05-24 DIAGNOSIS — N760 Acute vaginitis: Secondary | ICD-10-CM

## 2018-05-24 LAB — URINALYSIS, ROUTINE W REFLEX MICROSCOPIC
BILIRUBIN URINE: NEGATIVE
GLUCOSE, UA: NEGATIVE mg/dL
KETONES UR: 20 mg/dL — AB
LEUKOCYTES UA: NEGATIVE
Nitrite: NEGATIVE
PH: 5 (ref 5.0–8.0)
Protein, ur: NEGATIVE mg/dL
Specific Gravity, Urine: 1.029 (ref 1.005–1.030)

## 2018-05-24 LAB — WET PREP, GENITAL
Sperm: NONE SEEN
TRICH WET PREP: NONE SEEN
YEAST WET PREP: NONE SEEN

## 2018-05-24 LAB — PREGNANCY, URINE: Preg Test, Ur: NEGATIVE

## 2018-05-24 MED ORDER — CLINDAMYCIN HCL 150 MG PO CAPS: 300.0000 mg | ORAL_CAPSULE | Freq: Two times a day (BID) | ORAL | 0 refills | Status: AC

## 2018-05-24 MED ORDER — METRONIDAZOLE 500 MG PO TABS: 500.0000 mg | ORAL_TABLET | Freq: Two times a day (BID) | ORAL | 0 refills | Status: DC

## 2018-05-24 NOTE — ED Triage Notes (Signed)
Pt reports having vaginal discharge that is brown/pink in color that has been ongoing for the last 2 weeks.

## 2018-05-24 NOTE — Discharge Instructions (Signed)
You are seen in the ER for vaginal discharge.  Swabs today showed bacterial vaginosis which usually produces white vaginal discharge.  We will treat this with Flagyl.  Swabs for gonorrhea and chlamydia are still pending.  You declined treatment today.  He will be notified via phone if the results are positive.  If positive, you need treatment.  Return to the ER for fevers, abnormal vaginal bleeding, urinary symptoms, abdominal pain.  Follow-up with OB/GYN in 7 to 10 days if the discharge is not improving.

## 2018-05-24 NOTE — ED Provider Notes (Addendum)
[facility]-EMERGENCY DEPT Provider Note   CSN: [id number] Arrival date & time: 05/23/18  2346     History   Chief Complaint Chief Complaint  Patient presents with  . Vaginal Discharge    HPI Megan Valenzuela is a 37 y.o. female is here for evaluation of vaginal discharge.  Described as dark brown/black with pink.  Onset 1 to 2 weeks ago.  States typically she has some brown spotting the day before and after her.Marland Kitchen  LMP November 1 through the 6.  States this time the vaginal discharge has persisted.  Associated with intermittent suprapubic abdominal "soreness" and intermittent cramping.  G1, P1.  No previous abdominal surgeries.  She is married and has sexual intercourse with her husband only without condom use.  She is concerned that he may be having an affair.  States earlier this month he used a rubber ring around his penis during intercourse and she is afraid that a piece of it may be still in her vagina.  No interventions.  No alleviating or aggravating factors.  She denies fevers, chills, nausea, vomiting, dysuria, hematuria, increased urinary frequency  HPI  Past Medical History:  Diagnosis Date  . Asthma   . Dental abscess     There are no active problems to display for this patient.   Past Surgical History:  Procedure Laterality Date  . TYMPANOSTOMY TUBE PLACEMENT       OB History   None      Home Medications    Prior to Admission medications   Medication Sig Start Date End Date Taking? Authorizing Provider  acetaminophen (TYLENOL) 500 MG tablet Take 1 tablet (500 mg total) by mouth every 6 (six) hours as needed. 01/31/18   Fayrene Helper, PA-C  clindamycin (CLEOCIN) 150 MG capsule Take 2 capsules (300 mg total) by mouth 2 (two) times daily for 7 days. 05/24/18 05/31/18  Liberty Handy, PA-C  naproxen (NAPROSYN) 500 MG tablet Take 1 tablet (500 mg total) by mouth 2 (two) times daily. Patient not taking: Reported on 01/31/2018 01/12/18    Lew Dawes, PA-C    Family History Family History  Problem Relation Age of Onset  . Cancer Maternal Grandmother     Social History Social History   Tobacco Use  . Smoking status: Former Games developer  . Smokeless tobacco: Never Used  Substance Use Topics  . Alcohol use: No  . Drug use: No     Allergies   Flagyl [metronidazole]; Keflex [cephalexin]; and Latex   Review of Systems Review of Systems  Genitourinary: Positive for pelvic pain (Cramping) and vaginal discharge.  All other systems reviewed and are negative.    Physical Exam Updated Vital Signs BP (!) 148/82   Pulse 69   Temp 98.4 F (36.9 C) (Oral)   Resp 15   Ht 5\' 6"  (1.676 m)   Wt 127 kg   LMP 05/08/2018   SpO2 98%   BMI 45.19 kg/m   Physical Exam  Constitutional: She is oriented to person, place, and time. She appears well-developed and well-nourished.  Non toxic  HENT:  Head: Normocephalic and atraumatic.  Nose: Nose normal.  Eyes: Pupils are equal, round, and reactive to light. Conjunctivae and EOM are normal.  Neck: Normal range of motion.  Cardiovascular: Normal rate and regular rhythm.  Pulmonary/Chest: Effort normal and breath sounds normal.  Abdominal: Soft. Bowel sounds are normal. There is no tenderness.  No G/R/R. No suprapubic or CVA tenderness. Negative Murphy's and  McBurney's. Active BS to lower quadrants.   Genitourinary: Vaginal discharge found.  Genitourinary Comments: Exam performed with EMT at bedside for assistance. External genitalia without lesions.  No groin lymphadenopathy.  Vaginal mucosa and cervix pink without lesions.  Scant brown/yellow discharge in vaginal vault noted.  No CMT.  Nonpalpable, nontender adnexa.  Perianal skin normal without lesions.  Musculoskeletal: Normal range of motion.  Neurological: She is alert and oriented to person, place, and time.  Skin: Skin is warm and dry. Capillary refill takes less than 2 seconds.  Psychiatric: She has a normal mood  and affect. Her behavior is normal.  Nursing note and vitals reviewed.    ED Treatments / Results  Labs (all labs ordered are listed, but only abnormal results are displayed) Labs Reviewed  WET PREP, GENITAL - Abnormal; Notable for the following components:      Result Value   Clue Cells Wet Prep HPF POC PRESENT (*)    WBC, Wet Prep HPF POC FEW (*)    All other components within normal limits  URINALYSIS, ROUTINE W REFLEX MICROSCOPIC - Abnormal; Notable for the following components:   APPearance HAZY (*)    Hgb urine dipstick LARGE (*)    Ketones, ur 20 (*)    Bacteria, UA RARE (*)    All other components within normal limits  PREGNANCY, URINE  GC/CHLAMYDIA PROBE AMP ([place]) NOT [facility]    EKG None  Radiology No results found.  Procedures Procedures (including critical care time)  Medications Ordered in ED Medications - No data to display   Initial Impression / Assessment and Plan / ED Course  I have reviewed the triage vital signs and the nursing notes.  Pertinent labs & imaging results that were available during my care of the patient were reviewed by me and considered in my medical decision making (see chart for details).  Clinical Course as of May 24 726  Wynelle LinkSun May 24, 2018  0302 Hgb urine dipstick(!): LARGE [CG]  0302 Bacteria, UA(!): RARE [CG]  0302 WBC, UA: 6-10 [CG]    Clinical Course User Index [CG] Liberty HandyGibbons, Jerusalem Wert J, PA-C   37 year old here with vaginal discharge.  Only with mild intermittent cramping, none currently.  Exam is benign.  Swabs for gonorrhea chlamydia pending.  She is concerned her husband may be having an affair but declined empiric STD treatment today.  There was concern that a foreign body may be in her vagina, I did not see any ectopies on my exam.  Wet prep with clue cells.  Will treat for BV.  Pregnancy negative.  No fevers, abdominal tenderness to suggest PID or pelvic abscess.  Will discharge with recommendation to follow-up  with OB/GYN if discharge persists.  Return precautions discussed.  Patient is in agreement. Final Clinical Impressions(s) / ED Diagnoses   Final diagnoses:  BV (bacterial vaginosis)  Vaginal discharge   Pt declined flagyl due to reaction, when asked what reaction she states "I don't know, something".  Will dc with clindamycin.   ED Discharge Orders         Ordered    metroNIDAZOLE (FLAGYL) 500 MG tablet  2 times daily,   Status:  Discontinued     05/24/18 0640    clindamycin (CLEOCIN) 150 MG capsule  2 times daily     05/24/18 [id number]           Liberty HandyGibbons, Kaiea Esselman J, PA-C 05/24/18 0730    Nira Connardama, Pedro Eduardo, MD 05/24/18 0930

## 2018-05-25 LAB — GC/CHLAMYDIA PROBE AMP (~~LOC~~) NOT AT ARMC
Chlamydia: NEGATIVE
Neisseria Gonorrhea: NEGATIVE

## 2018-07-13 ENCOUNTER — Encounter (HOSPITAL_COMMUNITY): Payer: Self-pay

## 2018-07-13 ENCOUNTER — Other Ambulatory Visit: Payer: Self-pay

## 2018-07-13 ENCOUNTER — Ambulatory Visit (HOSPITAL_COMMUNITY)
Admission: EM | Admit: 2018-07-13 | Discharge: 2018-07-13 | Disposition: A | Discharge status: Home / Self Care | Payer: Medicaid Other | Attending: Internal Medicine | Admitting: Internal Medicine

## 2018-07-13 DIAGNOSIS — B9789 Other viral agents as the cause of diseases classified elsewhere: Secondary | ICD-10-CM | POA: Insufficient documentation

## 2018-07-13 DIAGNOSIS — J069 Acute upper respiratory infection, unspecified: Secondary | ICD-10-CM | POA: Insufficient documentation

## 2018-07-13 LAB — POCT RAPID STREP A: STREPTOCOCCUS, GROUP A SCREEN (DIRECT): NEGATIVE

## 2018-07-13 MED ORDER — FLUTICASONE PROPIONATE 50 MCG/ACT NA SUSP: 1.0000 | Freq: Every day | NASAL | 0 refills | Status: DC

## 2018-07-13 MED ORDER — CETIRIZINE HCL 10 MG PO CAPS: 10.0000 mg | ORAL_CAPSULE | Freq: Every day | ORAL | 0 refills | Status: DC

## 2018-07-13 MED ORDER — PSEUDOEPH-BROMPHEN-DM 30-2-10 MG/5ML PO SYRP: 5.0000 mL | ORAL_SOLUTION | Freq: Four times a day (QID) | ORAL | 0 refills | Status: DC | PRN

## 2018-07-13 MED ORDER — AMOXICILLIN-POT CLAVULANATE 875-125 MG PO TABS: 1.0000 | ORAL_TABLET | Freq: Two times a day (BID) | ORAL | 0 refills | Status: AC

## 2018-07-13 NOTE — ED Provider Notes (Signed)
[facility]    CSN: [id number] Arrival date & time: 07/13/18  1424     History   Chief Complaint Chief Complaint  Patient presents with  . Sore Throat    HPI Megan Valenzuela is a 38 y.o. female history of asthma presenting today for evaluation of sore throat.  Patient states that she has had a sore throat for the past week.  She is also had associated cough, nasal congestion and body aches.  Notes that her daughter was sick recently as well and was treated for the common cold.  She denies any known fevers, but has had hot and cold chills.  She has been trying Vicks over-the-counter without relief.  Her symptoms are slightly improving, but woke up this morning with a worsening sore throat.  HPI  Past Medical History:  Diagnosis Date  . Asthma   . Dental abscess     There are no active problems to display for this patient.   Past Surgical History:  Procedure Laterality Date  . TYMPANOSTOMY TUBE PLACEMENT      OB History   No obstetric history on file.      Home Medications    Prior to Admission medications   Medication Sig Start Date End Date Taking? Authorizing Provider  acetaminophen (TYLENOL) 500 MG tablet Take 1 tablet (500 mg total) by mouth every 6 (six) hours as needed. 01/31/18   Fayrene Helper, PA-C  amoxicillin-clavulanate (AUGMENTIN) 875-125 MG tablet Take 1 tablet by mouth every 12 (twelve) hours for 10 days. 07/16/18 07/26/18  Milli Woolridge C, PA-C  brompheniramine-pseudoephedrine-DM 30-2-10 MG/5ML syrup Take 5 mLs by mouth 4 (four) times daily as needed (cough). 07/13/18   Aliene Tamura C, PA-C  Cetirizine HCl 10 MG CAPS Take 1 capsule (10 mg total) by mouth daily for 10 days. 07/13/18 07/23/18  Shavanna Furnari C, PA-C  fluticasone (FLONASE) 50 MCG/ACT nasal spray Place 1-2 sprays into both nostrils daily for 7 days. 07/13/18 07/20/18  Denvil Canning, Junius Creamer, PA-C    Family History Family History  Problem Relation Age of Onset  . Cancer Maternal  Grandmother     Social History Social History   Tobacco Use  . Smoking status: Former Games developer  . Smokeless tobacco: Never Used  Substance Use Topics  . Alcohol use: No  . Drug use: No     Allergies   Flagyl [metronidazole]; Keflex [cephalexin]; and Latex   Review of Systems Review of Systems  Constitutional: Positive for chills. Negative for activity change, appetite change, fatigue and fever.  HENT: Positive for congestion, rhinorrhea and sore throat. Negative for ear pain, sinus pressure and trouble swallowing.   Eyes: Negative for discharge and redness.  Respiratory: Positive for cough. Negative for chest tightness and shortness of breath.   Cardiovascular: Negative for chest pain.  Gastrointestinal: Negative for abdominal pain, diarrhea, nausea and vomiting.  Musculoskeletal: Negative for myalgias.  Skin: Negative for rash.  Neurological: Negative for dizziness, light-headedness and headaches.     Physical Exam Triage Vital Signs ED Triage Vitals  Enc Vitals Group     BP 07/13/18 1435 136/89     Pulse Rate 07/13/18 1435 85     Resp 07/13/18 1435 16     Temp 07/13/18 1435 98.9 F (37.2 C)     Temp Source 07/13/18 1435 Oral     SpO2 07/13/18 1435 100 %     Weight 07/13/18 1434 289 lb (131.1 kg)     Height --  Head Circumference --      Peak Flow --      Pain Score 07/13/18 1434 8     Pain Loc --      Pain Edu? --      Excl. in GC? --    No data found.  Updated Vital Signs BP 136/89 (BP Location: Right Arm)   Pulse 85   Temp 98.9 F (37.2 C) (Oral)   Resp 16   Wt 289 lb (131.1 kg)   LMP 06/24/2018   SpO2 100%   BMI 46.65 kg/m   Visual Acuity Right Eye Distance:   Left Eye Distance:   Bilateral Distance:    Right Eye Near:   Left Eye Near:    Bilateral Near:     Physical Exam Vitals signs and nursing note reviewed.  Constitutional:      General: She is not in acute distress.    Appearance: She is well-developed.  HENT:     Head:  Normocephalic and atraumatic.     Ears:     Comments: Bilateral ears without tenderness to palpation of external auricle, tragus and mastoid, EAC's without erythema or swelling, TM's with good bony landmarks and cone of light. Non erythematous.    Mouth/Throat:     Comments: Oral mucosa pink and moist, no tonsillar enlargement or exudate. Posterior pharynx patent and nonerythematous, no uvula deviation or swelling. Normal phonation. Eyes:     Conjunctiva/sclera: Conjunctivae normal.  Neck:     Musculoskeletal: Neck supple.  Cardiovascular:     Rate and Rhythm: Normal rate and regular rhythm.     Heart sounds: No murmur.  Pulmonary:     Effort: Pulmonary effort is normal. No respiratory distress.     Breath sounds: Normal breath sounds.     Comments: Breathing comfortably at rest, CTABL, no wheezing, rales or other adventitious sounds auscultated Abdominal:     Palpations: Abdomen is soft.     Tenderness: There is no abdominal tenderness.  Skin:    General: Skin is warm and dry.  Neurological:     Mental Status: She is alert.      UC Treatments / Results  Labs (all labs ordered are listed, but only abnormal results are displayed) Labs Reviewed  CULTURE, GROUP A STREP [facility])  POCT RAPID STREP A    EKG None  Radiology No results found.  Procedures Procedures (including critical care time)  Medications Ordered in UC Medications - No data to display  Initial Impression / Assessment and Plan / UC Course  I have reviewed the triage vital signs and the nursing notes.  Pertinent labs & imaging results that were available during my care of the patient were reviewed by me and considered in my medical decision making (see chart for details).     Strep test negative.  URI symptoms for approximately 7 days.  Will recommend continued symptomatic management with Zyrtec and Flonase to help with any postnasal drainage contributing to sore throat.  Cough syrup as needed for cough.   Will provide prescription for Augmentin to fill in 3 to 4 days if symptoms not continuing to improve with further symptomatic management.Discussed strict return precautions. Patient verbalized understanding and is agreeable with plan.  Final Clinical Impressions(s) / UC Diagnoses   Final diagnoses:  Viral URI with cough     Discharge Instructions     Sore Throat  Your rapid strep tested Negative today. We will send for a culture and call in about 2  days if results are positive. For now we will treat your sore throat as a virus with symptom management. Please fill Augmentin in 3-4 days if recommendations below not helping symptoms improve over then next few days.  Please continue Tylenol or Ibuprofen for fever and pain. May try salt water gargles, cepacol lozenges, throat spray, or OTC cold relief medicine for throat discomfort. If you also have congestion take a daily anti-histamine like Zyrtec, Claritin, and a oral decongestant to help with post nasal drip that may be irritating your throat.   Stay hydrated and drink plenty of fluids to keep your throat coated relieve irritation.     ED Prescriptions    Medication Sig Dispense Auth. Provider   Cetirizine HCl 10 MG CAPS Take 1 capsule (10 mg total) by mouth daily for 10 days. 10 capsule Lilana Blasko C, PA-C   fluticasone (FLONASE) 50 MCG/ACT nasal spray Place 1-2 sprays into both nostrils daily for 7 days. 1 g Heidie Krall C, PA-C   amoxicillin-clavulanate (AUGMENTIN) 875-125 MG tablet Take 1 tablet by mouth every 12 (twelve) hours for 10 days. 20 tablet Espn Zeman C, PA-C   brompheniramine-pseudoephedrine-DM 30-2-10 MG/5ML syrup Take 5 mLs by mouth 4 (four) times daily as needed (cough). 120 mL Taksh Hjort C, PA-C     Controlled Substance Prescriptions [place] Controlled Substance Registry consulted? Not Applicable   Lew DawesWieters, Aariya Ferrick C, [place] 07/13/18 1639

## 2018-07-13 NOTE — ED Triage Notes (Signed)
Pt cc sore throat x 1 week. 

## 2018-07-13 NOTE — Discharge Instructions (Signed)
Sore Throat  Your rapid strep tested Negative today. We will send for a culture and call in about 2 days if results are positive. For now we will treat your sore throat as a virus with symptom management. Please fill Augmentin in 3-4 days if recommendations below not helping symptoms improve over then next few days.  Please continue Tylenol or Ibuprofen for fever and pain. May try salt water gargles, cepacol lozenges, throat spray, or OTC cold relief medicine for throat discomfort. If you also have congestion take a daily anti-histamine like Zyrtec, Claritin, and a oral decongestant to help with post nasal drip that may be irritating your throat.   Stay hydrated and drink plenty of fluids to keep your throat coated relieve irritation.

## 2018-07-16 LAB — CULTURE, GROUP A STREP (THRC)

## 2019-11-14 IMAGING — CR DG CHEST 2V
2 series · 2 of 2 positions shown · non-contrast
Comparison: 11/22/2016

CLINICAL DATA: Chest pain

EXAM:
CHEST  2 VIEW

[w chest pa]
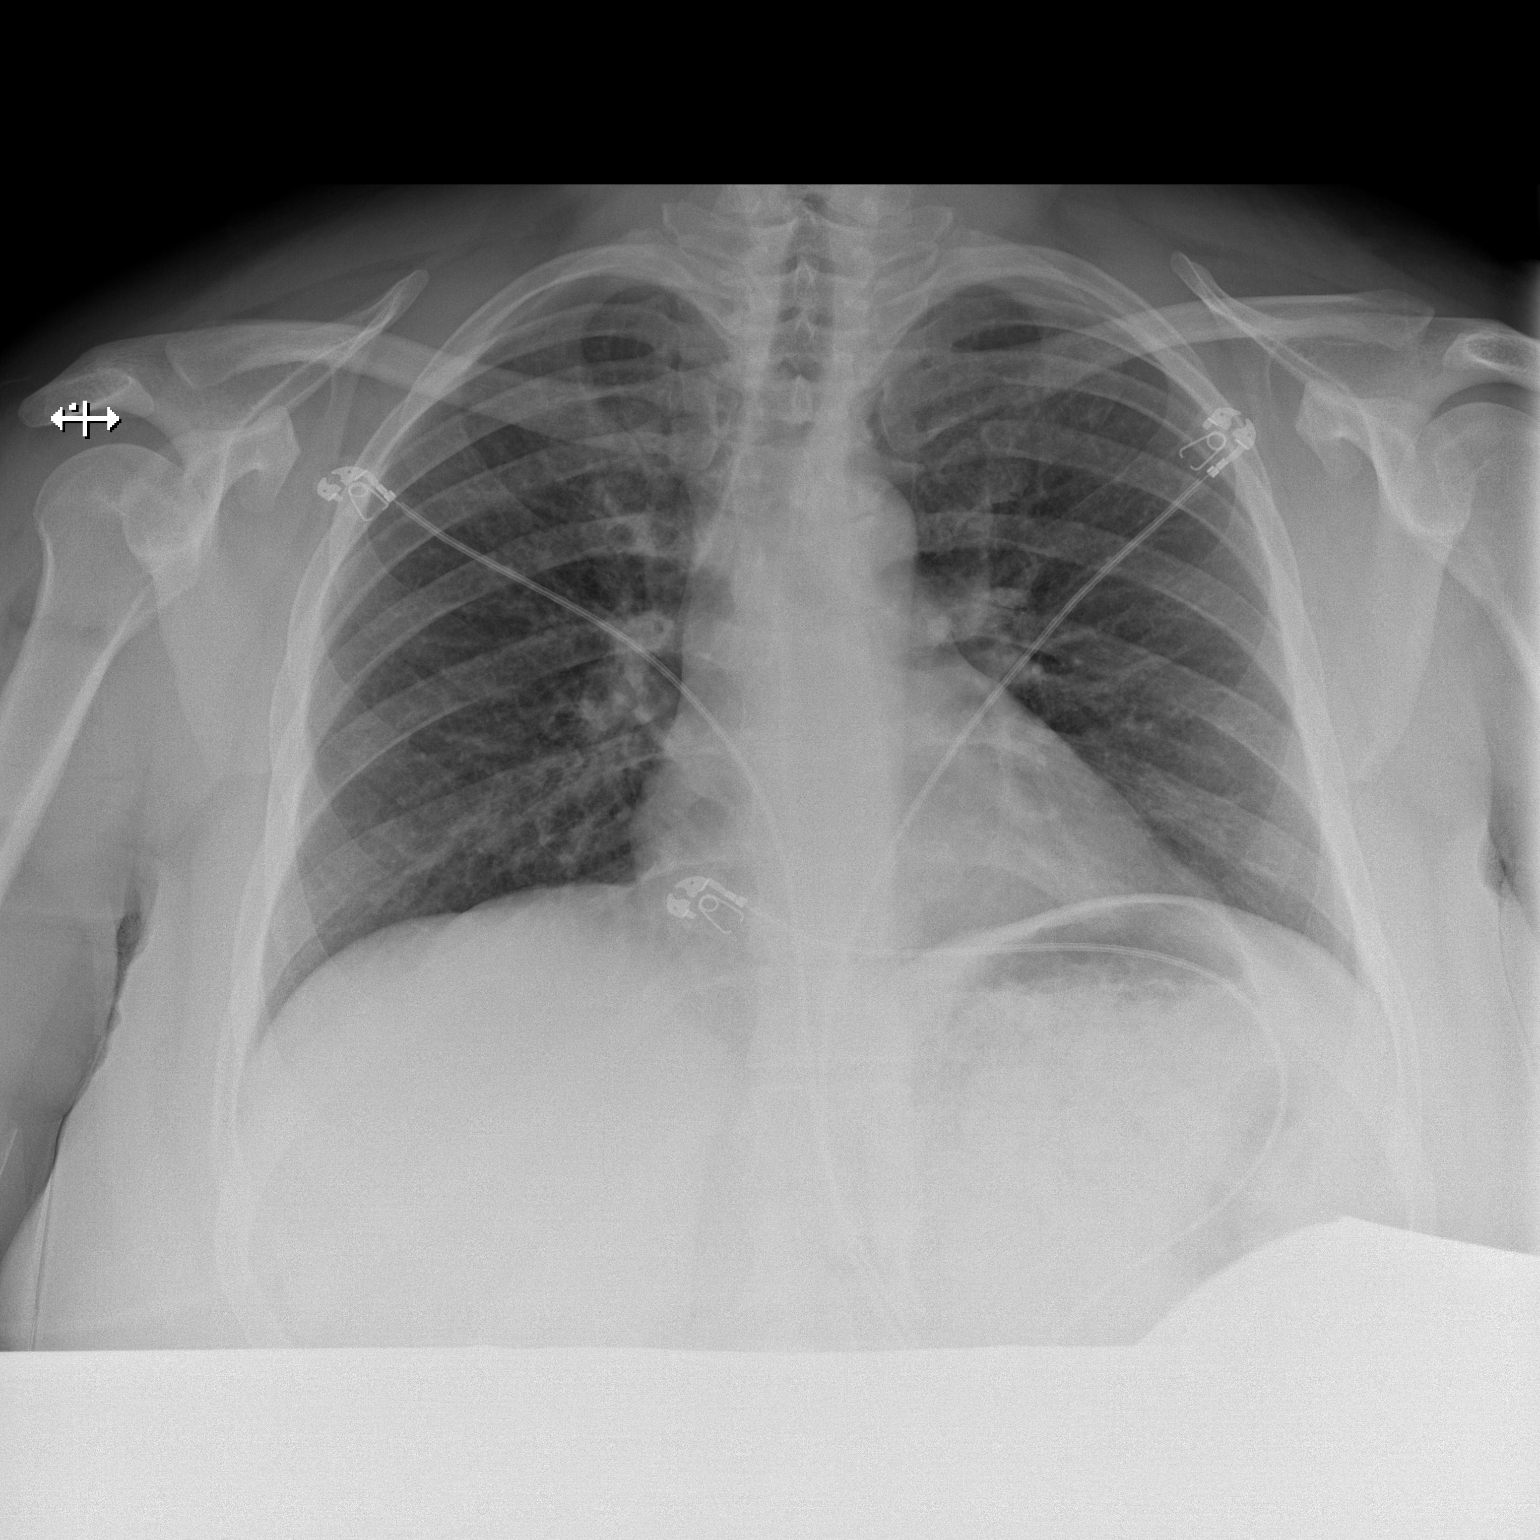

[w chest lat]
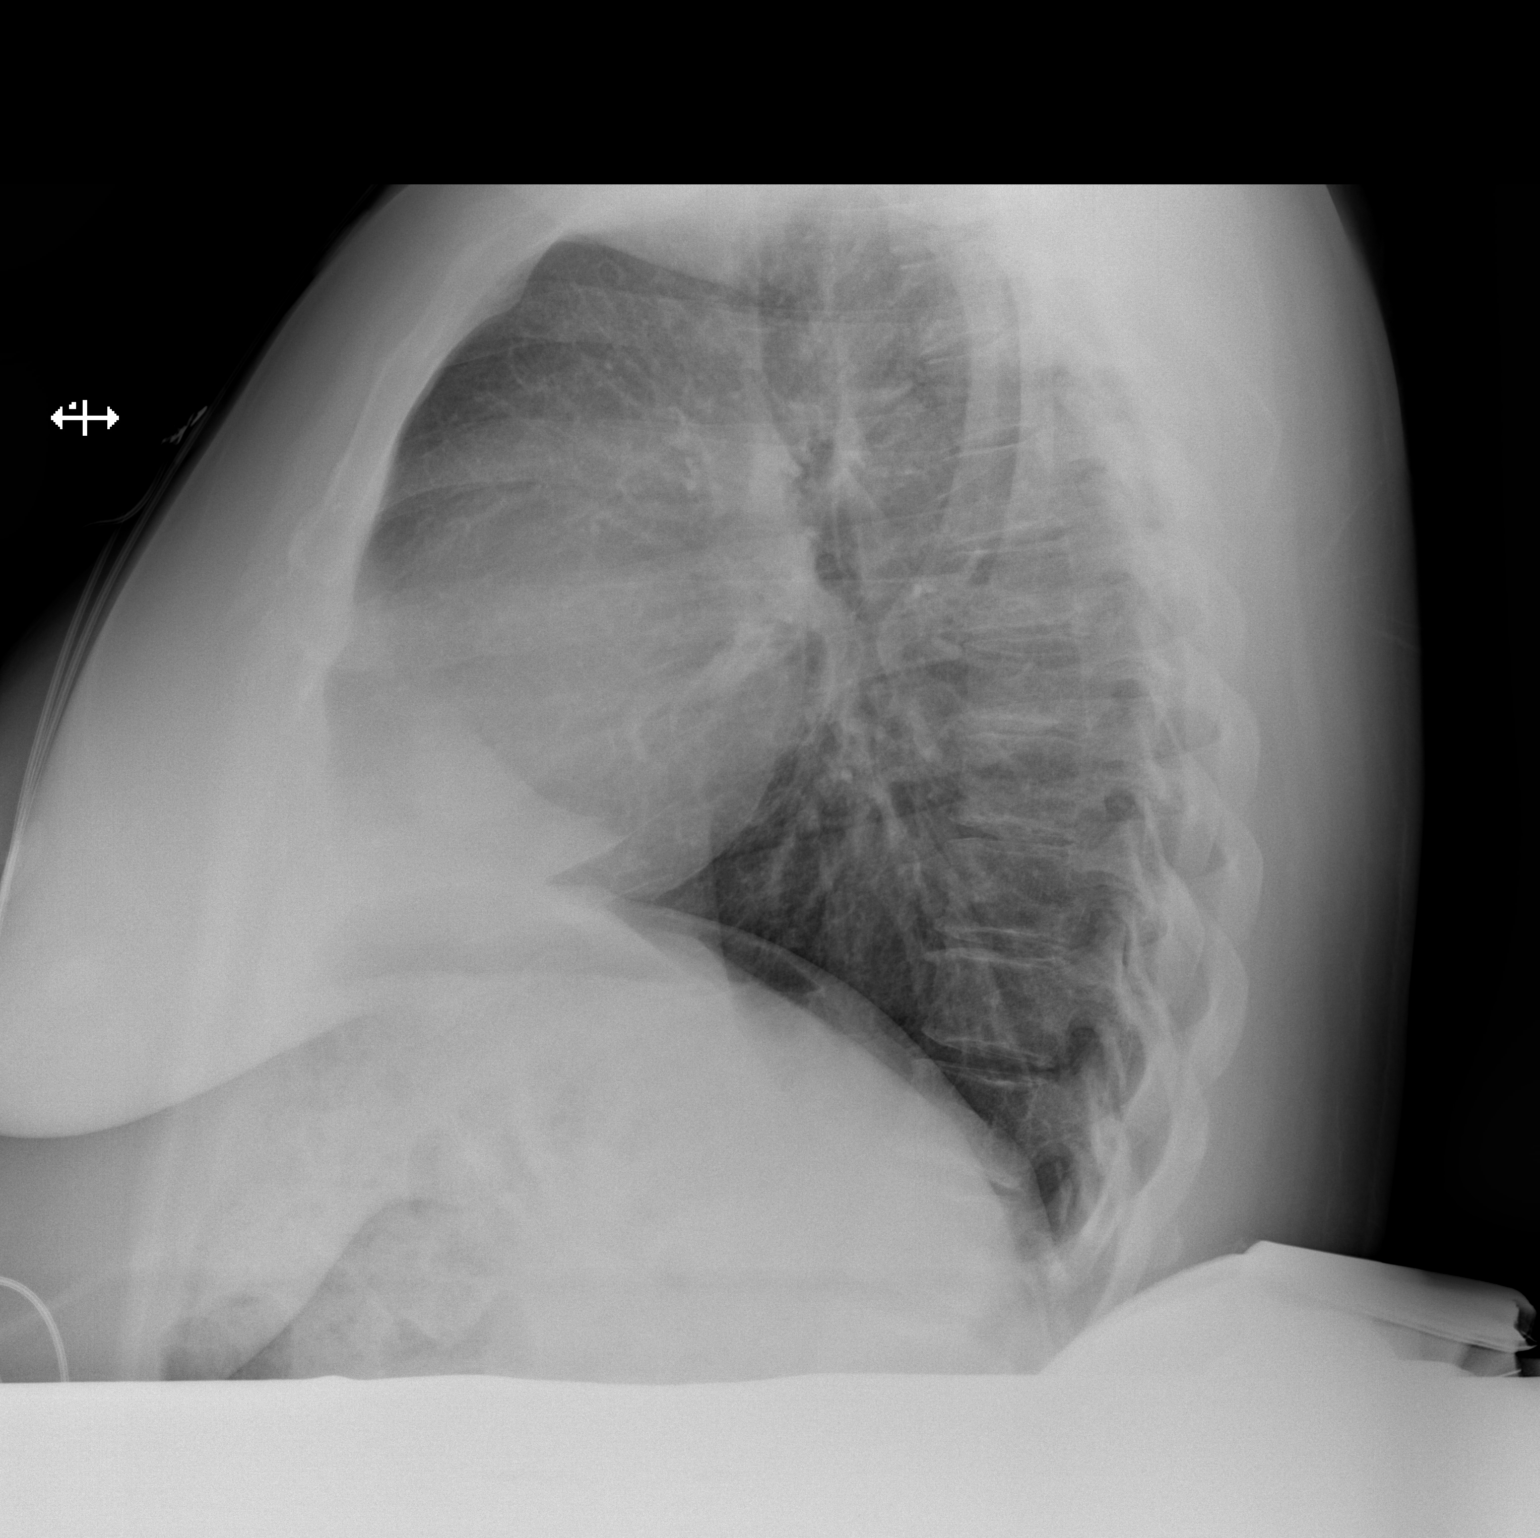

[2 of 2 positions shown; findings below may reference images not displayed]

FINDINGS: Slightly low lung volume. The heart size and mediastinal contours
are within normal limits. Both lungs are clear. The visualized
skeletal structures are unremarkable.
IMPRESSION: No active cardiopulmonary disease.  Low lung volume.

## 2020-02-28 ENCOUNTER — Encounter (HOSPITAL_COMMUNITY): Payer: Self-pay

## 2020-02-28 ENCOUNTER — Other Ambulatory Visit: Payer: Self-pay

## 2020-02-28 ENCOUNTER — Ambulatory Visit (HOSPITAL_COMMUNITY)
Admission: EM | Admit: 2020-02-28 | Discharge: 2020-02-28 | Disposition: A | Discharge status: Home / Self Care | Payer: Medicaid Other | Attending: Urgent Care | Admitting: Urgent Care

## 2020-02-28 DIAGNOSIS — R0981 Nasal congestion: Secondary | ICD-10-CM

## 2020-02-28 DIAGNOSIS — Z881 Allergy status to other antibiotic agents status: Secondary | ICD-10-CM | POA: Diagnosis not present

## 2020-02-28 DIAGNOSIS — R0982 Postnasal drip: Secondary | ICD-10-CM | POA: Diagnosis not present

## 2020-02-28 DIAGNOSIS — Z20822 Contact with and (suspected) exposure to covid-19: Secondary | ICD-10-CM | POA: Diagnosis not present

## 2020-02-28 DIAGNOSIS — J988 Other specified respiratory disorders: Secondary | ICD-10-CM | POA: Insufficient documentation

## 2020-02-28 DIAGNOSIS — J45909 Unspecified asthma, uncomplicated: Secondary | ICD-10-CM | POA: Insufficient documentation

## 2020-02-28 DIAGNOSIS — R519 Headache, unspecified: Secondary | ICD-10-CM | POA: Insufficient documentation

## 2020-02-28 DIAGNOSIS — Z87891 Personal history of nicotine dependence: Secondary | ICD-10-CM | POA: Diagnosis not present

## 2020-02-28 DIAGNOSIS — R067 Sneezing: Secondary | ICD-10-CM | POA: Insufficient documentation

## 2020-02-28 DIAGNOSIS — B9789 Other viral agents as the cause of diseases classified elsewhere: Secondary | ICD-10-CM | POA: Diagnosis not present

## 2020-02-28 MED ORDER — CETIRIZINE HCL 10 MG PO TABS: 10.0000 mg | ORAL_TABLET | Freq: Every day | ORAL | 0 refills | Status: DC

## 2020-02-28 MED ORDER — PSEUDOEPHEDRINE HCL 60 MG PO TABS: 60.0000 mg | ORAL_TABLET | Freq: Three times a day (TID) | ORAL | 0 refills | Status: DC | PRN

## 2020-02-28 NOTE — ED Provider Notes (Signed)
[facility]   MRN: [id number] DOB: [birth year]  Subjective:   Megan Valenzuela is a 39 y.o. female presenting for 1 week history of persistent sneezing, sinus drainage, coughing, sinus headache.  Patient has not been vaccinated for COVID-19.  Denies fever, shortness of breath, chest pain.  Has used NyQuil with some relief.  No current facility-administered medications for this encounter.  Current Outpatient Medications:  .  acetaminophen (TYLENOL) 500 MG tablet, Take 1 tablet (500 mg total) by mouth every 6 (six) hours as needed., Disp: 30 tablet, Rfl: 0 .  brompheniramine-pseudoephedrine-DM 30-2-10 MG/5ML syrup, Take 5 mLs by mouth 4 (four) times daily as needed (cough)., Disp: 120 mL, Rfl: 0 .  Cetirizine HCl 10 MG CAPS, Take 1 capsule (10 mg total) by mouth daily for 10 days., Disp: 10 capsule, Rfl: 0 .  fluticasone (FLONASE) 50 MCG/ACT nasal spray, Place 1-2 sprays into both nostrils daily for 7 days., Disp: 1 g, Rfl: 0   Allergies  Allergen Reactions  . Flagyl [Metronidazole] Other (See Comments)    headache  . Keflex [Cephalexin] Other (See Comments)    Stomach Cramps Bruising  . Latex Itching and Rash    Past Medical History:  Diagnosis Date  . Asthma   . Dental abscess      Past Surgical History:  Procedure Laterality Date  . TYMPANOSTOMY TUBE PLACEMENT      Family History  Problem Relation Age of Onset  . Cancer Maternal Grandmother     Social History   Tobacco Use  . Smoking status: Former Games developer  . Smokeless tobacco: Never Used  Vaping Use  . Vaping Use: Never used  Substance Use Topics  . Alcohol use: No  . Drug use: No    ROS   Objective:   Vitals: BP (!) 144/98   Pulse 85   Temp 98.5 F (36.9 C) (Oral)   Resp 18   Ht 5\' 8"  (1.727 m)   Wt 300 lb (136.1 kg)   SpO2 100%   BMI 45.61 kg/m   Physical Exam Constitutional:      General: She is not in acute distress.    Appearance: Normal appearance. She is well-developed.  She is not ill-appearing, toxic-appearing or diaphoretic.  HENT:     Head: Normocephalic and atraumatic.     Nose: Nose normal.     Mouth/Throat:     Mouth: Mucous membranes are moist.  Eyes:     Extraocular Movements: Extraocular movements intact.     Pupils: Pupils are equal, round, and reactive to light.  Cardiovascular:     Rate and Rhythm: Normal rate and regular rhythm.     Pulses: Normal pulses.     Heart sounds: Normal heart sounds. No murmur heard.  No friction rub. No gallop.   Pulmonary:     Effort: Pulmonary effort is normal. No respiratory distress.     Breath sounds: Normal breath sounds. No stridor. No wheezing, rhonchi or rales.  Skin:    General: Skin is warm and dry.     Findings: No rash.  Neurological:     Mental Status: She is alert and oriented to person, place, and time.  Psychiatric:        Mood and Affect: Mood normal.        Behavior: Behavior normal.        Thought Content: Thought content normal.       Assessment and Plan :   PDMP not reviewed this encounter.  1. Viral respiratory infection   2. Sinus headache   3. Sinus congestion   4. Sneezing   5. Post-nasal drainage     Will manage for viral illness such as viral URI, viral syndrome, viral rhinitis, COVID-19. Counseled patient on nature of COVID-19 including modes of transmission, diagnostic testing, management and supportive care.  Offered scripts for symptomatic relief. COVID 19 testing is pending. Counseled patient on potential for adverse effects with medications prescribed/recommended today, ER and return-to-clinic precautions discussed, patient verbalized understanding.     Wallis Bamberg, [place] 02/29/20 [phone]

## 2020-02-28 NOTE — ED Triage Notes (Signed)
Pt c/o HA, light headedness today. Pt c/o nsal drainage, sneezingx1 wk.

## 2020-02-28 NOTE — Discharge Instructions (Addendum)

## 2020-02-29 LAB — SARS CORONAVIRUS 2 (TAT 6-24 HRS): SARS Coronavirus 2: NEGATIVE

## 2020-03-22 ENCOUNTER — Other Ambulatory Visit: Payer: Self-pay

## 2020-03-22 ENCOUNTER — Encounter (HOSPITAL_COMMUNITY): Payer: Self-pay

## 2020-03-22 ENCOUNTER — Ambulatory Visit (HOSPITAL_COMMUNITY)
Admission: EM | Admit: 2020-03-22 | Discharge: 2020-03-22 | Disposition: A | Discharge status: Home / Self Care | Payer: Medicaid Other | Attending: Family Medicine | Admitting: Family Medicine

## 2020-03-22 DIAGNOSIS — Z87891 Personal history of nicotine dependence: Secondary | ICD-10-CM | POA: Insufficient documentation

## 2020-03-22 DIAGNOSIS — R509 Fever, unspecified: Secondary | ICD-10-CM | POA: Diagnosis not present

## 2020-03-22 DIAGNOSIS — R197 Diarrhea, unspecified: Secondary | ICD-10-CM | POA: Insufficient documentation

## 2020-03-22 DIAGNOSIS — Z881 Allergy status to other antibiotic agents status: Secondary | ICD-10-CM | POA: Diagnosis not present

## 2020-03-22 DIAGNOSIS — Z20822 Contact with and (suspected) exposure to covid-19: Secondary | ICD-10-CM | POA: Diagnosis not present

## 2020-03-22 DIAGNOSIS — R52 Pain, unspecified: Secondary | ICD-10-CM

## 2020-03-22 NOTE — ED Triage Notes (Signed)
Pt c/o 3 bouts of diarrhea on Monday, but has since resolved. PT c/o body aches, fever and chillsx2 days. Highest fever was 28F.

## 2020-03-22 NOTE — ED Notes (Signed)
Called, no answer x1 

## 2020-03-22 NOTE — Discharge Instructions (Addendum)
You have been tested for COVID-19 today. °If your test returns positive, you will receive a phone call from [facility] regarding your results. °Negative test results are not called. °Both positive and negative results area always visible on MyChart. °If you do not have a MyChart account, sign up instructions are provided in your discharge papers. °Please do not hesitate to contact us should you have questions or concerns. ° °

## 2020-03-23 LAB — SARS CORONAVIRUS 2 (TAT 6-24 HRS): SARS Coronavirus 2: NEGATIVE

## 2020-03-28 NOTE — ED Provider Notes (Signed)
[facility]   [id number] 03/22/20 Arrival Time: 1706  ASSESSMENT & PLAN:  1. Diarrhea, unspecified type   2. Generalized body aches   3. Subjective fever     Discussed likely viral etiology. Ensure adeq hydration. COVID-19 testing sent. See letter/work note on file for self-isolation guidelines. OTC symptom care as needed.   Follow-up Information    [facility].   Specialty: Urgent Care Why: As needed. Contact information: [address] [place] [address] [phone]              Reviewed expectations re: course of current medical issues. Questions answered. Outlined signs and symptoms indicating need for more acute intervention. Understanding verbalized. After Visit Summary given.   SUBJECTIVE: History from: patient. Megan Valenzuela is a 39 y.o. female who presents with worries regarding COVID-19. Known COVID-19 contact: none. Recent travel: none. Reports initial diarrhea, now resolved. Body aches, sub fever and chills for two days. Denies: difficulty breathing. Normal PO intake without n/v/d.    OBJECTIVE:  Vitals:   03/22/20 1901  BP: (!) 149/88  Pulse: (!) 121  Resp: 20  Temp: 97.8 F (36.6 C)  TempSrc: Oral  SpO2: 94%  Weight: (!) 140.6 kg  Height: 5\' 8"  (1.727 m)    General appearance: alert; no distress Eyes: PERRLA; EOMI; conjunctiva normal HENT: [place]; AT; without nasal congestion Neck: supple  CV: tachycardia noted Lungs: speaks full sentences without difficulty; unlabored Extremities: no edema Skin: warm and dry Neurologic: normal gait Psychological: alert and cooperative; normal mood and affect  Labs:  Labs Reviewed  SARS CORONAVIRUS 2 (TAT 6-24 HRS)      Allergies  Allergen Reactions  . Flagyl [Metronidazole] Other (See Comments)    headache  . Keflex [Cephalexin] Other (See Comments)    Stomach Cramps Bruising  . Latex Itching and Rash    Past Medical History:    Diagnosis Date  . Asthma   . Dental abscess    Social History   Socioeconomic History  . Marital status: Legally Separated    Spouse name: Not on file  . Number of children: Not on file  . Years of education: Not on file  . Highest education level: Not on file  Occupational History  . Not on file  Tobacco Use  . Smoking status: Former  . Smokeless tobacco: Never Used  Vaping Use  . Vaping Use: Never used  Substance and Sexual Activity  . Alcohol use: No  . Drug use: No  . Sexual activity: Not on file  Other Topics Concern  . Not on file  Social History Narrative  . Not on file   Social Determinants of Health   Financial Resource Strain:   . Difficulty of Paying Living Expenses: Not on file  Food Insecurity:   . Worried About Games developer in the Last Year: Not on file  . Ran Out of Food in the Last Year: Not on file  Transportation Needs:   . Lack of Transportation (Medical): Not on file  . Lack of Transportation (Non-Medical): Not on file  Physical Activity:   . Days of Exercise per Week: Not on file  . Minutes of Exercise per Session: Not on file  Stress:   . Feeling of Stress : Not on file  Social Connections:   . Frequency of Communication with Friends and Family: Not on file  . Frequency of Social Gatherings with Friends and Family: Not on  file  . Attends Religious Services: Not on file  . Active Member of Clubs or Organizations: Not on file  . Attends Banker Meetings: Not on file  . Marital Status: Not on file  Intimate Partner Violence:   . Fear of Current or Ex-Partner: Not on file  . Emotionally Abused: Not on file  . Physically Abused: Not on file  . Sexually Abused: Not on file   Family History  Problem Relation Age of Onset  . Cancer Maternal Grandmother    Past Surgical History:  Procedure Laterality Date  . TYMPANOSTOMY TUBE PLACEMENT       Mardella Layman, MD 03/28/20 [phone]

## 2020-05-18 ENCOUNTER — Ambulatory Visit (HOSPITAL_COMMUNITY)
Admission: EM | Admit: 2020-05-18 | Discharge: 2020-05-18 | Disposition: A | Discharge status: Home / Self Care | Payer: Medicaid Other | Attending: Family Medicine | Admitting: Family Medicine

## 2020-05-18 ENCOUNTER — Other Ambulatory Visit: Payer: Self-pay

## 2020-05-18 ENCOUNTER — Encounter (HOSPITAL_COMMUNITY): Payer: Self-pay

## 2020-05-18 DIAGNOSIS — N898 Other specified noninflammatory disorders of vagina: Secondary | ICD-10-CM | POA: Diagnosis not present

## 2020-05-18 DIAGNOSIS — Z3202 Encounter for pregnancy test, result negative: Secondary | ICD-10-CM

## 2020-05-18 DIAGNOSIS — R3915 Urgency of urination: Secondary | ICD-10-CM | POA: Diagnosis present

## 2020-05-18 DIAGNOSIS — R109 Unspecified abdominal pain: Secondary | ICD-10-CM

## 2020-05-18 DIAGNOSIS — R350 Frequency of micturition: Secondary | ICD-10-CM | POA: Insufficient documentation

## 2020-05-18 LAB — POCT URINALYSIS DIPSTICK, ED / UC
Bilirubin Urine: NEGATIVE
Glucose, UA: NEGATIVE mg/dL
Hgb urine dipstick: NEGATIVE
Ketones, ur: NEGATIVE mg/dL
Leukocytes,Ua: NEGATIVE
Nitrite: NEGATIVE
Protein, ur: NEGATIVE mg/dL
Specific Gravity, Urine: 1.02 (ref 1.005–1.030)
Urobilinogen, UA: 0.2 mg/dL (ref 0.0–1.0)
pH: 6.5 (ref 5.0–8.0)

## 2020-05-18 LAB — POC URINE PREG, ED: Preg Test, Ur: NEGATIVE

## 2020-05-18 MED ORDER — CLINDAMYCIN HCL 150 MG PO CAPS: 150.0000 mg | ORAL_CAPSULE | Freq: Two times a day (BID) | ORAL | 0 refills | Status: DC

## 2020-05-18 NOTE — ED Triage Notes (Signed)
Pt in with c/o urinary urgency and frequency and right flank pain that started a few days ago.  Also states urine is cloudy and dark  Denies dysuria, hematuria, or vaginal irritation

## 2020-05-18 NOTE — ED Provider Notes (Signed)
[facility]   MRN: [id number] DOB: [birth year]  Subjective:   Megan Valenzuela is a 39 y.o. female presenting for several day history of urinary frequency, urgency, intermittent mild right-sided flank pain.  Patient has also had thick white vaginal discharge.  Denies fever, nausea, vomiting, abdominal or pelvic pain, genital rash, dysuria, hematuria.  Admits that she does not drink enough water and has been trying to change that in the last day or so.  She drinks fruit juice and energy drinks frequently.  Has had BV and yeast infections in the past.  No current facility-administered medications for this encounter.  Current Outpatient Medications:  .  acetaminophen (TYLENOL) 500 MG tablet, Take 1 tablet (500 mg total) by mouth every 6 (six) hours as needed., Disp: 30 tablet, Rfl: 0 .  cetirizine (ZYRTEC ALLERGY) 10 MG tablet, Take 1 tablet (10 mg total) by mouth daily., Disp: 30 tablet, Rfl: 0 .  fluticasone (FLONASE) 50 MCG/ACT nasal spray, Place 1-2 sprays into both nostrils daily for 7 days., Disp: 1 g, Rfl: 0 .  pseudoephedrine (SUDAFED) 60 MG tablet, Take 1 tablet (60 mg total) by mouth every 8 (eight) hours as needed for congestion., Disp: 30 tablet, Rfl: 0   Allergies  Allergen Reactions  . Flagyl [Metronidazole] Other (See Comments)    headache  . Keflex [Cephalexin] Other (See Comments)    Stomach Cramps Bruising  . Latex Itching and Rash    Past Medical History:  Diagnosis Date  . Asthma   . Dental abscess      Past Surgical History:  Procedure Laterality Date  . TYMPANOSTOMY TUBE PLACEMENT      Family History  Problem Relation Age of Onset  . Cancer Maternal Grandmother     Social History   Tobacco Use  . Smoking status: Former Games developer  . Smokeless tobacco: Never Used  Vaping Use  . Vaping Use: Never used  Substance Use Topics  . Alcohol use: No  . Drug use: No    ROS   Objective:   Vitals: BP (!) 155/87 (BP Location: Right  Arm)   Pulse 92   Temp 97.8 F (36.6 C) (Oral)   Resp 20   LMP 03/16/2020   SpO2 98%   Physical Exam Constitutional:      General: She is not in acute distress.    Appearance: Normal appearance. She is well-developed. She is not ill-appearing, toxic-appearing or diaphoretic.  HENT:     Head: Normocephalic and atraumatic.     Nose: Nose normal.     Mouth/Throat:     Mouth: Mucous membranes are moist.     Pharynx: Oropharynx is clear.  Eyes:     General: No scleral icterus.       Right eye: No discharge.        Left eye: No discharge.     Extraocular Movements: Extraocular movements intact.     Conjunctiva/sclera: Conjunctivae normal.     Pupils: Pupils are equal, round, and reactive to light.  Cardiovascular:     Rate and Rhythm: Normal rate.  Pulmonary:     Effort: Pulmonary effort is normal.  Skin:    General: Skin is warm and dry.  Neurological:     General: No focal deficit present.     Mental Status: She is alert and oriented to person, place, and time.  Psychiatric:        Mood and Affect: Mood normal.  Behavior: Behavior normal.        Thought Content: Thought content normal.        Judgment: Judgment normal.     Results for orders placed or performed during the hospital encounter of 05/18/20 (from the past 24 hour(s))  POC Urinalysis dipstick     Status: None   Collection Time: 05/18/20  3:39 PM  Result Value Ref Range   Glucose, UA NEGATIVE NEGATIVE mg/dL   Bilirubin Urine NEGATIVE NEGATIVE   Ketones, ur NEGATIVE NEGATIVE mg/dL   Specific Gravity, Urine 1.020 1.005 - 1.030   Hgb urine dipstick NEGATIVE NEGATIVE   pH 6.5 5.0 - 8.0   Protein, ur NEGATIVE NEGATIVE mg/dL   Urobilinogen, UA 0.2 0.0 - 1.0 mg/dL   Nitrite NEGATIVE NEGATIVE   Leukocytes,Ua NEGATIVE NEGATIVE    Assessment and Plan :   PDMP not reviewed this encounter.  1. Vaginal discharge   2. Urinary frequency   3. Urinary urgency   4. Right flank pain     Urine culture and  labs pending.  Recommended starting clindamycin to address her symptoms for bacterial vaginosis empirically.  Hydrate with plain water, avoid urinary irritants. Counseled patient on potential for adverse effects with medications prescribed/recommended today, ER and return-to-clinic precautions discussed, patient verbalized understanding.    Wallis Bamberg, PA-C 05/18/20 1615

## 2020-05-18 NOTE — Discharge Instructions (Signed)
I will address your symptoms for bacterial vaginosis with clindamycin. Make sure you hydrate very well with plain water and a quantity of 64 ounces of water a day.  Please limit drinks that are considered urinary irritants such as soda, sweet tea, coffee, energy drinks, alcohol.  These can worsen your urinary symptoms and also be the source of them.  I will let you know about your urine culture results through MyChart to see if we need to change your antibiotics based off of those results.

## 2020-05-19 LAB — URINE CULTURE

## 2020-05-19 LAB — CERVICOVAGINAL ANCILLARY ONLY
Bacterial Vaginitis (gardnerella): NEGATIVE
Chlamydia: NEGATIVE
Comment: NEGATIVE
Comment: NEGATIVE
Comment: NEGATIVE
Comment: NORMAL
Neisseria Gonorrhea: NEGATIVE
Trichomonas: NEGATIVE

## 2020-05-30 IMAGING — CR DG FOOT COMPLETE 3+V*L*
3 series · 3 of 3 positions shown · non-contrast
Comparison: None.

CLINICAL DATA: Left foot pain.  Question of stepping on a nail.

EXAM:
LEFT FOOT - COMPLETE 3+ VIEW

[x foot ap left]
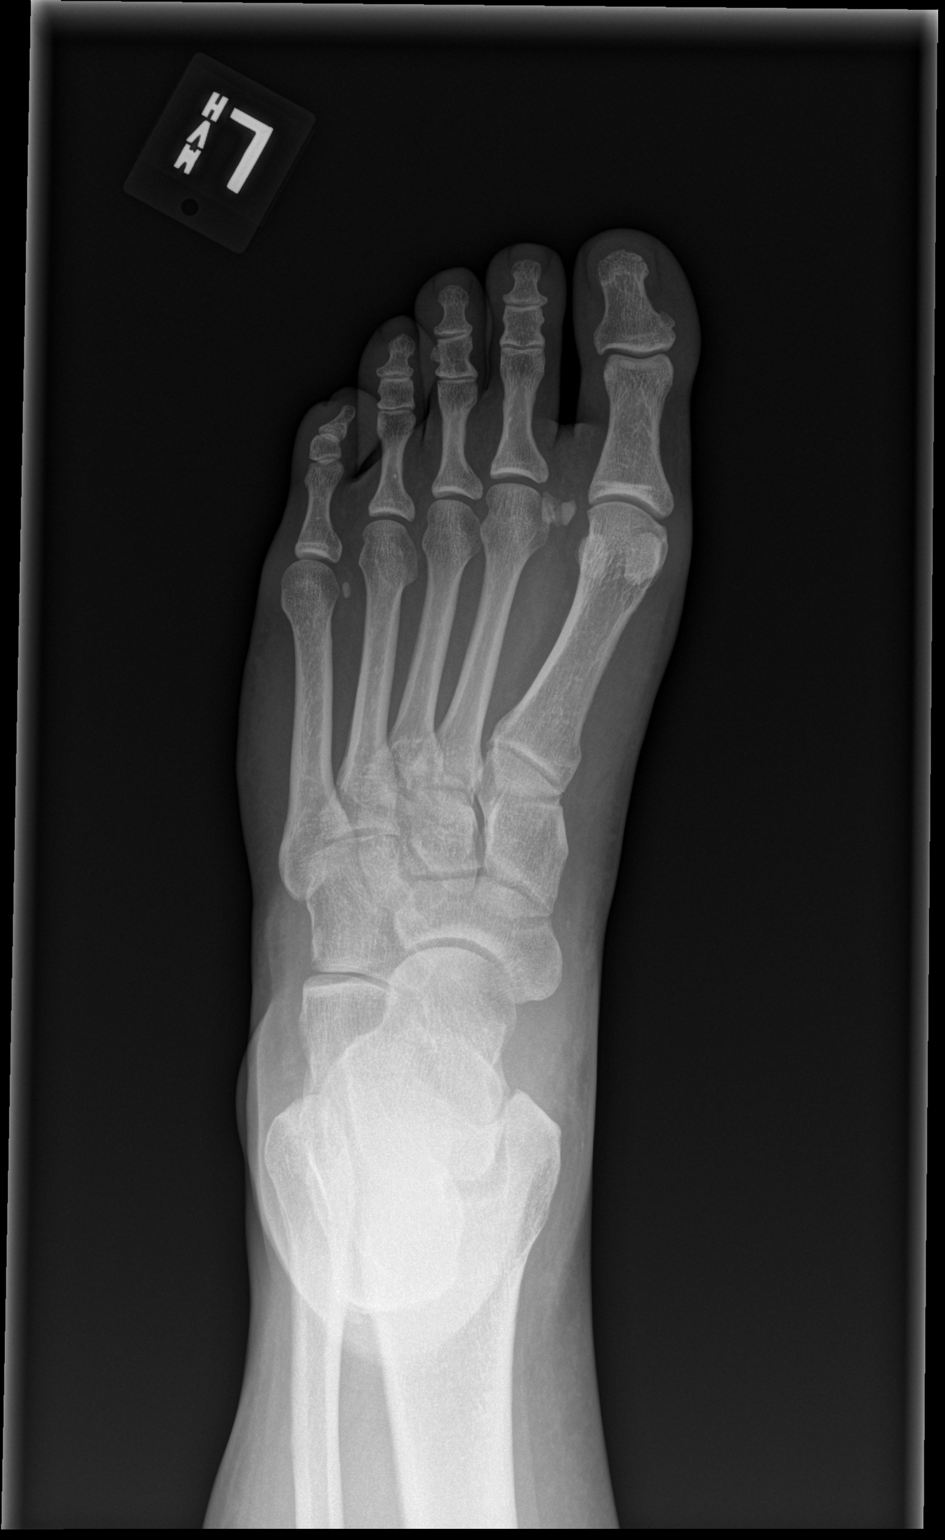

[x foot obl left]
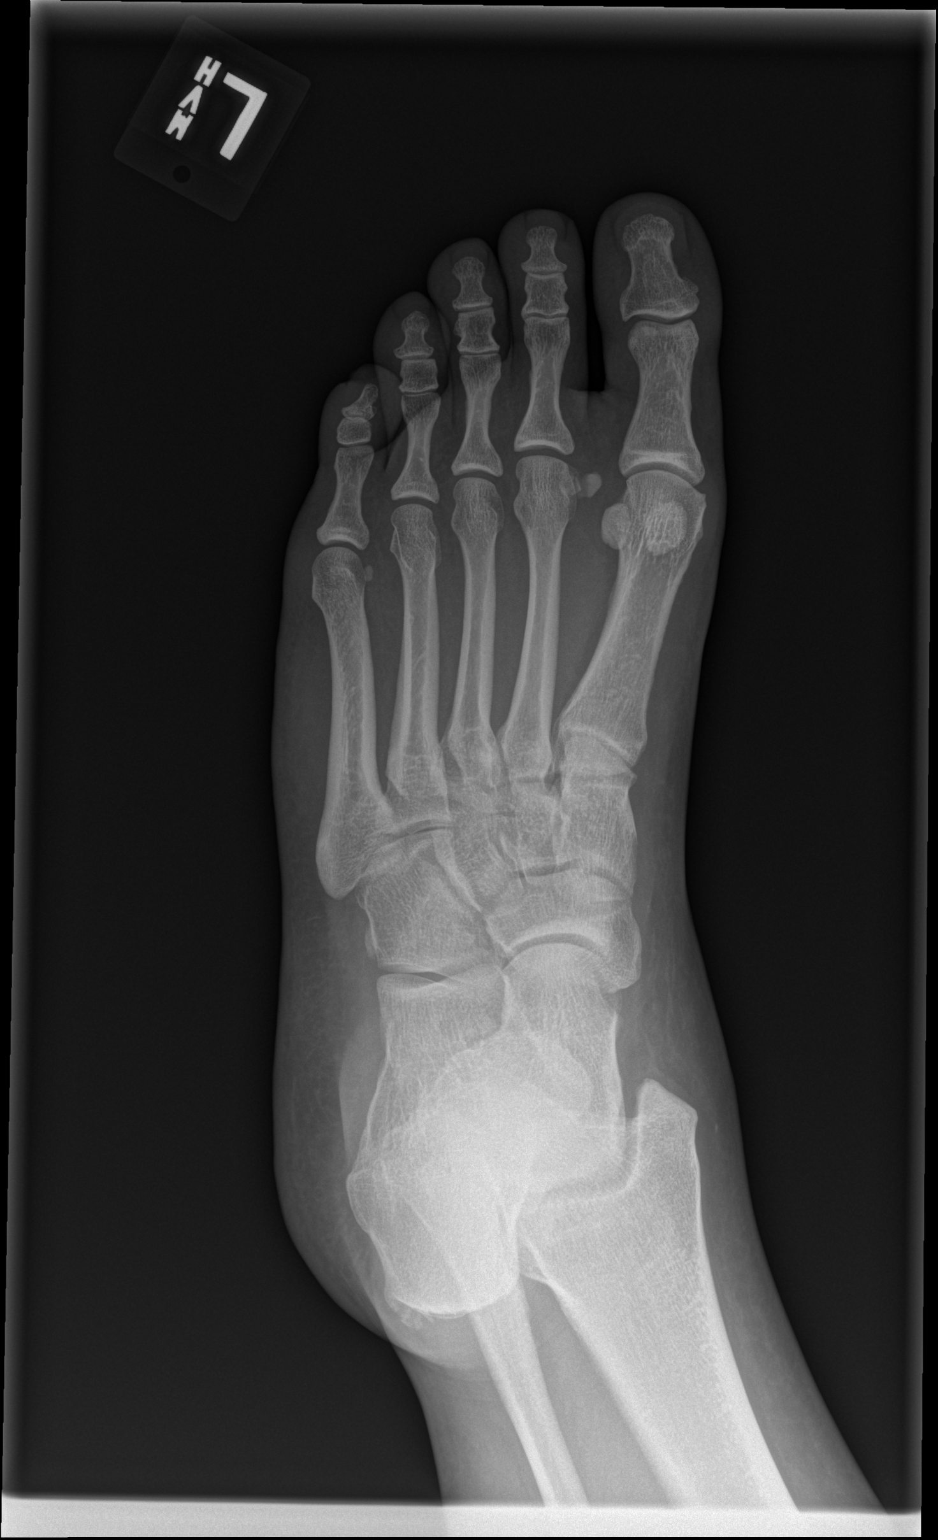

[x foot lat left]
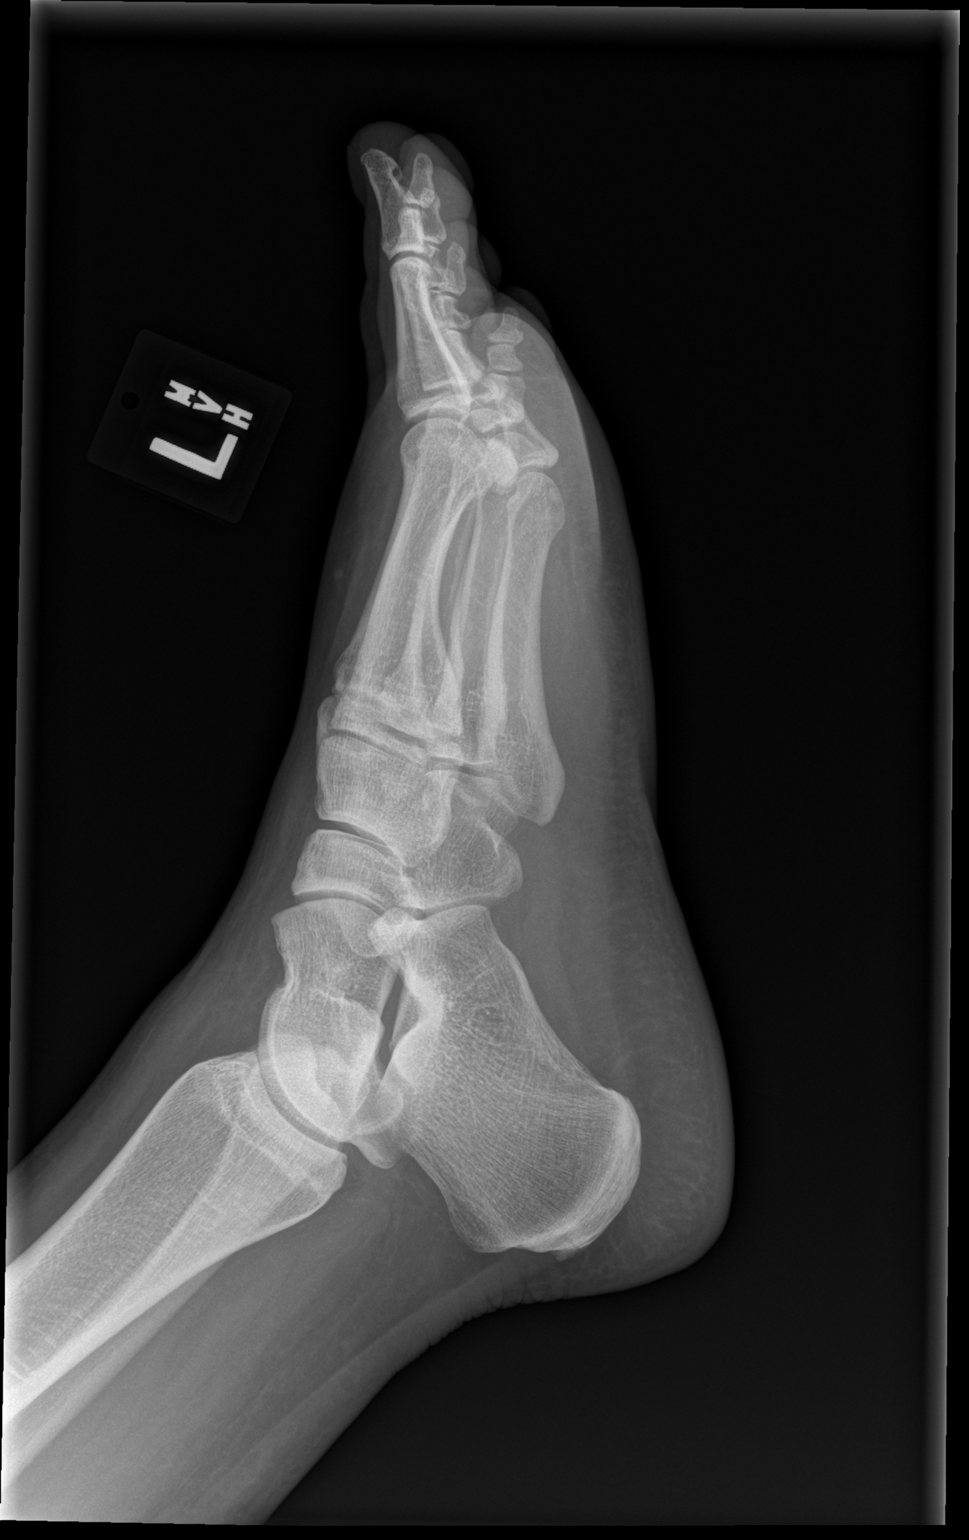

[3 of 3 positions shown; findings below may reference images not displayed]

FINDINGS: No fracture or dislocation. No suspicious focal osseous lesion. Tiny
Achilles left calcaneal spur. No significant arthropathy. Small
sharply marginated calcifications in the plantar soft tissues just
medial to the second metatarsal head, presumably accessory
sesamoids. Otherwise no radiopaque foreign body.
IMPRESSION: No fracture or dislocation. Small sharply marginated calcifications
in the plantar soft tissues just medial to the second metatarsal
head, presumably accessory sesamoids, correlate with clinical exam.
Otherwise no radiopaque foreign body.

## 2020-07-25 ENCOUNTER — Encounter (HOSPITAL_COMMUNITY): Payer: Self-pay | Admitting: *Deleted

## 2020-07-25 ENCOUNTER — Ambulatory Visit (HOSPITAL_COMMUNITY)
Admission: EM | Admit: 2020-07-25 | Discharge: 2020-07-25 | Disposition: A | Discharge status: Home / Self Care | Payer: Medicaid Other | Attending: Family Medicine | Admitting: Family Medicine

## 2020-07-25 ENCOUNTER — Other Ambulatory Visit: Payer: Self-pay

## 2020-07-25 DIAGNOSIS — J069 Acute upper respiratory infection, unspecified: Secondary | ICD-10-CM | POA: Diagnosis not present

## 2020-07-25 MED ORDER — PREDNISONE 50 MG PO TABS: ORAL_TABLET | ORAL | 0 refills | Status: DC

## 2020-07-25 NOTE — ED Provider Notes (Signed)
[facility]    CSN: [id number] Arrival date & time: 07/25/20  1211      History   Chief Complaint Chief Complaint  Patient presents with  . Headache    nausea  . Nausea  . Otalgia    Rt    HPI Megan Valenzuela is a 40 y.o. female.   Here today with headache, cough, nausea, right ear pain x 1 day. She has tried nyquil without much benefit. Denies fever, chills, abdominal pain, diarrhea. No known sick contacts, chronic medical problems.      Past Medical History:  Diagnosis Date  . Asthma   . Dental abscess     There are no problems to display for this patient.   Past Surgical History:  Procedure Laterality Date  . TYMPANOSTOMY TUBE PLACEMENT      OB History   No obstetric history on file.      Home Medications    Prior to Admission medications   Medication Sig Start Date End Date Taking? Authorizing Provider  acetaminophen (TYLENOL) 500 MG tablet Take 1 tablet (500 mg total) by mouth every 6 (six) hours as needed. 01/31/18  Yes Fayrene Helper, PA-C  cetirizine (ZYRTEC ALLERGY) 10 MG tablet Take 1 tablet (10 mg total) by mouth daily. 02/28/20  Yes Wallis Bamberg, PA-C  predniSONE (DELTASONE) 50 MG tablet Take 1 tab daily with breakfast x 3 days 07/25/20  Yes Particia Nearing, PA-C  clindamycin (CLEOCIN) 150 MG capsule Take 1 capsule (150 mg total) by mouth 2 (two) times daily. 05/18/20   Wallis Bamberg, PA-C  fluticasone (FLONASE) 50 MCG/ACT nasal spray Place 1-2 sprays into both nostrils daily for 7 days. 07/13/18 07/20/18  Wieters, Hallie C, PA-C  pseudoephedrine (SUDAFED) 60 MG tablet Take 1 tablet (60 mg total) by mouth every 8 (eight) hours as needed for congestion. 02/28/20   Wallis Bamberg, PA-C    Family History Family History  Problem Relation Age of Onset  . Cancer Maternal Grandmother     Social History Social History   Tobacco Use  . Smoking status: Former Games developer  . Smokeless tobacco: Never Used  Vaping Use  . Vaping Use: Never used   Substance Use Topics  . Alcohol use: No  . Drug use: No     Allergies   Flagyl [metronidazole], Keflex [cephalexin], and Latex   Review of Systems Review of Systems PER HPI   Physical Exam Triage Vital Signs ED Triage Vitals  Enc Vitals Group     BP 07/25/20 1321 125/85     Pulse Rate 07/25/20 1321 88     Resp 07/25/20 1321 18     Temp 07/25/20 1321 98.6 F (37 C)     Temp Source 07/25/20 1321 Oral     SpO2 07/25/20 1321 97 %     Weight --      Height --      Head Circumference --      Peak Flow --      Pain Score 07/25/20 1317 7     Pain Loc --      Pain Edu? --      Excl. in GC? --    No data found.  Updated Vital Signs BP 125/85 (BP Location: Right Arm)   Pulse 88   Temp 98.6 F (37 C) (Oral)   Resp 18   LMP 06/24/2020   SpO2 97%   Visual Acuity Right Eye Distance:   Left Eye Distance:   Bilateral  Distance:    Right Eye Near:   Left Eye Near:    Bilateral Near:     Physical Exam Vitals and nursing note reviewed.  Constitutional:      Appearance: Normal appearance. She is not ill-appearing.  HENT:     Head: Atraumatic.     Right Ear: Tympanic membrane normal.     Left Ear: Tympanic membrane normal.     Nose: Nose normal.     Mouth/Throat:     Mouth: Mucous membranes are moist.     Pharynx: Posterior oropharyngeal erythema present. No oropharyngeal exudate.  Eyes:     Extraocular Movements: Extraocular movements intact.     Conjunctiva/sclera: Conjunctivae normal.  Cardiovascular:     Rate and Rhythm: Normal rate and regular rhythm.     Heart sounds: Normal heart sounds.  Pulmonary:     Effort: Pulmonary effort is normal. No respiratory distress.     Breath sounds: Normal breath sounds. No wheezing or rales.  Abdominal:     General: Bowel sounds are normal. There is no distension.     Palpations: Abdomen is soft.     Tenderness: There is no abdominal tenderness. There is no right CVA tenderness, left CVA tenderness or guarding.   Musculoskeletal:        General: Normal range of motion.     Cervical back: Normal range of motion and neck supple.  Skin:    General: Skin is warm and dry.  Neurological:     Mental Status: She is alert and oriented to person, place, and time.  Psychiatric:        Mood and Affect: Mood normal.        Thought Content: Thought content normal.        Judgment: Judgment normal.     UC Treatments / Results  Labs (all labs ordered are listed, but only abnormal results are displayed) Labs Reviewed - No data to display  EKG   Radiology No results found.  Procedures Procedures (including critical care time)  Medications Ordered in UC Medications - No data to display  Initial Impression / Assessment and Plan / UC Course  I have reviewed the triage vital signs and the nursing notes.  Pertinent labs & imaging results that were available during my care of the patient were reviewed by me and considered in my medical decision making (see chart for details).     Declines COVID testing today, discussed to quarantine and OTC remedies, supportive care. Prednisone burst for sinus sxs, ear pain and close monitoring for worsening sxs.   Final Clinical Impressions(s) / UC Diagnoses   Final diagnoses:  Viral URI   Discharge Instructions   None    ED Prescriptions    Medication Sig Dispense Auth. Provider   predniSONE (DELTASONE) 50 MG tablet Take 1 tab daily with breakfast x 3 days 3 tablet Particia Nearing, [place]     PDMP not reviewed this encounter.   Particia Nearing, [place] 07/25/20 1356

## 2020-07-25 NOTE — ED Triage Notes (Signed)
PT reports starting yesterday she has had a HA ,cough ,nausea and RT ear pain.

## 2020-10-14 ENCOUNTER — Ambulatory Visit (HOSPITAL_COMMUNITY)
Admission: EM | Admit: 2020-10-14 | Discharge: 2020-10-14 | Disposition: A | Discharge status: Home / Self Care | Payer: Medicaid Other | Attending: Internal Medicine | Admitting: Internal Medicine

## 2020-10-14 ENCOUNTER — Encounter (HOSPITAL_COMMUNITY): Payer: Self-pay | Admitting: Emergency Medicine

## 2020-10-14 DIAGNOSIS — N898 Other specified noninflammatory disorders of vagina: Secondary | ICD-10-CM

## 2020-10-14 DIAGNOSIS — Z3202 Encounter for pregnancy test, result negative: Secondary | ICD-10-CM | POA: Diagnosis not present

## 2020-10-14 DIAGNOSIS — Z113 Encounter for screening for infections with a predominantly sexual mode of transmission: Secondary | ICD-10-CM

## 2020-10-14 DIAGNOSIS — R1030 Lower abdominal pain, unspecified: Secondary | ICD-10-CM | POA: Diagnosis not present

## 2020-10-14 DIAGNOSIS — R319 Hematuria, unspecified: Secondary | ICD-10-CM

## 2020-10-14 LAB — POCT URINALYSIS DIPSTICK, ED / UC
Bilirubin Urine: NEGATIVE
Glucose, UA: NEGATIVE mg/dL
Hgb urine dipstick: NEGATIVE
Ketones, ur: NEGATIVE mg/dL
Leukocytes,Ua: NEGATIVE
Nitrite: NEGATIVE
Protein, ur: NEGATIVE mg/dL
Specific Gravity, Urine: >=1.03 (ref 1.005–1.030)
Urobilinogen, UA: 0.2 mg/dL (ref 0.0–1.0)
pH: 5.5 (ref 5.0–8.0)

## 2020-10-14 LAB — POC URINE PREG, ED: Preg Test, Ur: NEGATIVE

## 2020-10-14 MED ORDER — CLINDAMYCIN HCL 300 MG PO CAPS: 300.0000 mg | ORAL_CAPSULE | Freq: Two times a day (BID) | ORAL | 0 refills | Status: DC

## 2020-10-14 NOTE — Discharge Instructions (Addendum)
May try boric acid suppositories over-the-counter as needed to help support a healthy vaginal pH.  Do not use any feminine washes or scented products in this area

## 2020-10-14 NOTE — ED Triage Notes (Signed)
Pt presents today requesting to be checked for UTI. She c/o of foul odor and blood in urine. No pain or discomfort today.

## 2020-10-14 NOTE — ED Provider Notes (Signed)
[facility]    CSN: [id number] Arrival date & time: 10/14/20  1004      History   Chief Complaint Chief Complaint  Patient presents with  . Hematuria    HPI Megan Valenzuela is a 40 y.o. female.   Patient here today for intermittent foul odor, vaginal discharge, blood with wiping after urination.  She also states she had 1 random quick sharp pain in her right flank a week or so ago.  To stop some Flagyl that she was on prior to completion of course of treatment for BV as she did not know if the Flagyl was causing her current symptoms.  Denies concern for STDs, states she was checked very recently and has had no new partner since then.  Denies fever, chills, abdominal pain, nausea vomiting diarrhea, pelvic pain.     Past Medical History:  Diagnosis Date  . Asthma   . Dental abscess     There are no problems to display for this patient.   Past Surgical History:  Procedure Laterality Date  . TYMPANOSTOMY TUBE PLACEMENT      OB History   No obstetric history on file.      Home Medications    Prior to Admission medications   Medication Sig Start Date End Date Taking? Authorizing Provider  clindamycin (CLEOCIN) 300 MG capsule Take 1 capsule (300 mg total) by mouth in the morning and at bedtime. 10/14/20  Yes Particia Nearing, PA-C  acetaminophen (TYLENOL) 500 MG tablet Take 1 tablet (500 mg total) by mouth every 6 (six) hours as needed. 01/31/18   Fayrene Helper, PA-C  cetirizine (ZYRTEC ALLERGY) 10 MG tablet Take 1 tablet (10 mg total) by mouth daily. 02/28/20   Wallis Bamberg, PA-C  fluticasone (FLONASE) 50 MCG/ACT nasal spray Place 1-2 sprays into both nostrils daily for 7 days. 07/13/18 07/20/18  Wieters, Hallie C, PA-C  predniSONE (DELTASONE) 50 MG tablet Take 1 tab daily with breakfast x 3 days 07/25/20   Particia Nearing, PA-C  pseudoephedrine (SUDAFED) 60 MG tablet Take 1 tablet (60 mg total) by mouth every 8 (eight) hours as needed for congestion.  02/28/20   Wallis Bamberg, PA-C    Family History Family History  Problem Relation Age of Onset  . Cancer Maternal Grandmother     Social History Social History   Tobacco Use  . Smoking status: Former Games developer  . Smokeless tobacco: Never Used  Vaping Use  . Vaping Use: Never used  Substance Use Topics  . Alcohol use: No  . Drug use: No     Allergies   Flagyl [metronidazole], Keflex [cephalexin], and Latex   Review of Systems Review of Systems Per HPI Physical Exam Triage Vital Signs ED Triage Vitals [10/14/20 1033]  Enc Vitals Group     BP (!) 159/90     Pulse Rate 83     Resp 18     Temp 99 F (37.2 C)     Temp Source Oral     SpO2 98 %     Weight      Height      Head Circumference      Peak Flow      Pain Score      Pain Loc      Pain Edu?      Excl. in GC?    No data found.  Updated Vital Signs BP (!) 159/90 (BP Location: Right Arm)   Pulse 83   Temp  99 F (37.2 C) (Oral)   Resp 18   LMP 10/11/2020   SpO2 98%   Visual Acuity Right Eye Distance:   Left Eye Distance:   Bilateral Distance:    Right Eye Near:   Left Eye Near:    Bilateral Near:     Physical Exam Vitals and nursing note reviewed.  Constitutional:      Appearance: Normal appearance. She is not ill-appearing.  HENT:     Head: Atraumatic.     Mouth/Throat:     Mouth: Mucous membranes are moist.     Pharynx: Oropharynx is clear.  Eyes:     Extraocular Movements: Extraocular movements intact.     Conjunctiva/sclera: Conjunctivae normal.  Cardiovascular:     Rate and Rhythm: Normal rate and regular rhythm.     Heart sounds: Normal heart sounds.  Pulmonary:     Effort: Pulmonary effort is normal.     Breath sounds: Normal breath sounds.  Abdominal:     General: Bowel sounds are normal. There is no distension.     Palpations: Abdomen is soft.     Tenderness: There is no abdominal tenderness. There is no right CVA tenderness, left CVA tenderness or guarding.   Musculoskeletal:        General: Normal range of motion.     Cervical back: Normal range of motion and neck supple.  Skin:    General: Skin is warm and dry.  Neurological:     Mental Status: She is alert and oriented to person, place, and time.     Motor: No weakness.     Gait: Gait normal.  Psychiatric:        Mood and Affect: Mood normal.        Thought Content: Thought content normal.        Judgment: Judgment normal.     UC Treatments / Results  Labs (all labs ordered are listed, but only abnormal results are displayed) Labs Reviewed  POCT URINALYSIS DIPSTICK, ED / UC  POC URINE PREG, ED  CERVICOVAGINAL ANCILLARY ONLY    EKG   Radiology No results found.  Procedures Procedures (including critical care time)  Medications Ordered in UC Medications - No data to display  Initial Impression / Assessment and Plan / UC Course  I have reviewed the triage vital signs and the nursing notes.  Pertinent labs & imaging results that were available during my care of the patient were reviewed by me and considered in my medical decision making (see chart for details).     UA, urine pregnancy both negative today.  We will send out for urine culture as she has had positive cultures with negative UAs in the past.  We will also start clindamycin for BV as she never finished her course of metronidazole last week.  Await vaginal swab and urine culture results.  Adjust treatment as needed based on these results.  Discussed DC of Summer's Eve products which she states she has been using and to try boric acid suppositories to help support a healthy vaginal pH.  Final Clinical Impressions(s) / UC Diagnoses   Final diagnoses:  Lower abdominal pain  Vaginal discharge  Routine screening for STI (sexually transmitted infection)     Discharge Instructions     May try boric acid suppositories over-the-counter as needed to help support a healthy vaginal pH.  Do not use any feminine  washes or scented products in this area    ED Prescriptions  Medication Sig Dispense Auth. Provider   clindamycin (CLEOCIN) 300 MG capsule Take 1 capsule (300 mg total) by mouth in the morning and at bedtime. 14 capsule Particia Nearing, [place]     PDMP not reviewed this encounter.   Particia Nearing, [place] 10/14/20 1124

## 2020-10-16 LAB — CERVICOVAGINAL ANCILLARY ONLY
Bacterial Vaginitis (gardnerella): POSITIVE — AB
Candida Glabrata: NEGATIVE
Candida Vaginitis: NEGATIVE
Chlamydia: NEGATIVE
Comment: NEGATIVE
Comment: NEGATIVE
Comment: NEGATIVE
Comment: NEGATIVE
Comment: NEGATIVE
Comment: NORMAL
Neisseria Gonorrhea: NEGATIVE
Trichomonas: NEGATIVE

## 2020-11-05 ENCOUNTER — Encounter (HOSPITAL_COMMUNITY): Payer: Self-pay | Admitting: *Deleted

## 2020-11-05 ENCOUNTER — Ambulatory Visit (HOSPITAL_COMMUNITY)
Admission: EM | Admit: 2020-11-05 | Discharge: 2020-11-05 | Disposition: A | Discharge status: Home / Self Care | Payer: Medicaid Other

## 2020-11-05 ENCOUNTER — Other Ambulatory Visit: Payer: Self-pay

## 2020-11-05 DIAGNOSIS — R110 Nausea: Secondary | ICD-10-CM | POA: Diagnosis not present

## 2020-11-05 DIAGNOSIS — R197 Diarrhea, unspecified: Secondary | ICD-10-CM | POA: Diagnosis not present

## 2020-11-05 NOTE — ED Triage Notes (Signed)
Pt presents today for work note. Three days ago Pt had nausea ,diarrhea but no Sx today.

## 2020-11-05 NOTE — ED Provider Notes (Signed)
[facility]    CSN: [id number] Arrival date & time: 11/05/20  1629      History   Chief Complaint Chief Complaint  Patient presents with  . Nausea  . Fever  . Diarrhea    HPI Megan Valenzuela is a 40 y.o. female.   HPI   Nausea: Patient reports that a stomach bug hit her household a few days ago.  She had symptoms for about 4 days of nausea, few episodes of diarrhea and vomiting.  She did not have any episodes of bloody vomiting or bloody diarrhea.  She tried to stay hydrated with water for symptoms. Her symptoms have fully resolved.  She needs a return to work note.  She denies any dysuria, fevers, abdominal pain or recent symptoms.  Past Medical History:  Diagnosis Date  . Asthma   . Dental abscess     There are no problems to display for this patient.   Past Surgical History:  Procedure Laterality Date  . TYMPANOSTOMY TUBE PLACEMENT      OB History   No obstetric history on file.      Home Medications    Prior to Admission medications   Medication Sig Start Date End Date Taking? Authorizing Provider  acetaminophen (TYLENOL) 500 MG tablet Take 1 tablet (500 mg total) by mouth every 6 (six) hours as needed. 01/31/18   Fayrene Helper, PA-C  cetirizine (ZYRTEC ALLERGY) 10 MG tablet Take 1 tablet (10 mg total) by mouth daily. 02/28/20   Wallis Bamberg, PA-C  clindamycin (CLEOCIN) 300 MG capsule Take 1 capsule (300 mg total) by mouth in the morning and at bedtime. 10/14/20   Particia Nearing, PA-C  fluticasone [facility]) 50 MCG/ACT nasal spray Place 1-2 sprays into both nostrils daily for 7 days. 07/13/18 07/20/18  Wieters, Hallie C, PA-C  predniSONE (DELTASONE) 50 MG tablet Take 1 tab daily with breakfast x 3 days 07/25/20   Particia Nearing, PA-C  pseudoephedrine (SUDAFED) 60 MG tablet Take 1 tablet (60 mg total) by mouth every 8 (eight) hours as needed for congestion. 02/28/20   Wallis Bamberg, PA-C    Family History Family History  Problem Relation  Age of Onset  . Cancer Maternal Grandmother     Social History Social History   Tobacco Use  . Smoking status: Former Games developer  . Smokeless tobacco: Never Used  Vaping Use  . Vaping Use: Never used  Substance Use Topics  . Alcohol use: No  . Drug use: No     Allergies   Flagyl [metronidazole], Keflex [cephalexin], and Latex   Review of Systems Review of Systems  As stated above in hPI Physical Exam Triage Vital Signs ED Triage Vitals [11/05/20 1712]  Enc Vitals Group     BP 133/78     Pulse Rate 65     Resp 18     Temp 98.6 F (37 C)     Temp Source Oral     SpO2 100 %     Weight      Height      Head Circumference      Peak Flow      Pain Score      Pain Loc      Pain Edu?      Excl. in GC?    No data found.  Updated Vital Signs BP 133/78 (BP Location: Right Arm)   Pulse 65   Temp 98.6 F (37 C) (Oral)   Resp 18  LMP 10/11/2020   SpO2 100%   Physical Exam Vitals and nursing note reviewed.  Constitutional:      General: She is not in acute distress.    Appearance: Normal appearance. She is obese. She is not ill-appearing, toxic-appearing or diaphoretic.  HENT:     Head: Normocephalic and atraumatic.     Mouth/Throat:     Mouth: Mucous membranes are moist.  Eyes:     Comments: NO pallor or jaundice  Cardiovascular:     Rate and Rhythm: Normal rate and regular rhythm.     Heart sounds: Normal heart sounds.  Pulmonary:     Effort: Pulmonary effort is normal.     Breath sounds: Normal breath sounds.  Abdominal:     General: Abdomen is flat. Bowel sounds are normal. There is no distension.     Palpations: Abdomen is soft. There is no mass.     Tenderness: There is no abdominal tenderness. There is no right CVA tenderness, left CVA tenderness, guarding or rebound.     Hernia: No hernia is present.  Musculoskeletal:     Cervical back: Normal range of motion and neck supple.  Lymphadenopathy:     Cervical: No cervical adenopathy.  Skin:     General: Skin is warm.     Coloration: Skin is not jaundiced.  Neurological:     Mental Status: She is alert.      UC Treatments / Results  Labs (all labs ordered are listed, but only abnormal results are displayed) Labs Reviewed - No data to display  EKG   Radiology No results found.  Procedures Procedures (including critical care time)  Medications Ordered in UC Medications - No data to display  Initial Impression / Assessment and Plan / UC Course  I have reviewed the triage vital signs and the nursing notes.  Pertinent labs & imaging results that were available during my care of the patient were reviewed by me and considered in my medical decision making (see chart for details).     New.  Resolved.  Discussed continuation of hydration with water and what to do if symptoms should recur.  Work note given. Final Clinical Impressions(s) / UC Diagnoses   Final diagnoses:  None   Discharge Instructions   None    ED Prescriptions    None     PDMP not reviewed this encounter.   Rushie Chestnut, [place] 11/05/20 1745

## 2020-12-10 ENCOUNTER — Other Ambulatory Visit: Payer: Self-pay

## 2020-12-10 ENCOUNTER — Encounter (HOSPITAL_COMMUNITY): Payer: Self-pay | Admitting: Emergency Medicine

## 2020-12-10 ENCOUNTER — Ambulatory Visit (HOSPITAL_COMMUNITY)
Admission: EM | Admit: 2020-12-10 | Discharge: 2020-12-10 | Disposition: A | Discharge status: Home / Self Care | Payer: Medicaid Other | Attending: Internal Medicine | Admitting: Internal Medicine

## 2020-12-10 DIAGNOSIS — R0789 Other chest pain: Secondary | ICD-10-CM

## 2020-12-10 DIAGNOSIS — R600 Localized edema: Secondary | ICD-10-CM | POA: Diagnosis not present

## 2020-12-10 DIAGNOSIS — R609 Edema, unspecified: Secondary | ICD-10-CM | POA: Diagnosis not present

## 2020-12-10 NOTE — Discharge Instructions (Addendum)
Patient left prior to being discharged.

## 2020-12-10 NOTE — ED Triage Notes (Signed)
Pt not in room, tried calling via phone with no answer.

## 2020-12-10 NOTE — ED Provider Notes (Signed)
[facility]    CSN: [id number] Arrival date & time: 12/10/20  1256      History   Chief Complaint Chief Complaint  Patient presents with  . Edema    HPI Megan Valenzuela is a 40 y.o. female.   Patient presents today with a 3-day history of increased pedal edema.  She reports this occurs primarily at night after she has been on her feet all day and then improves when she elevates them on a pillow overnight.  She denies any associated pain.  Reports edema is symmetrical.  She does report occasional chest tightness but had previously attributed this to anxiety as she is going through some personal problems.  She is currently asymptomatic and denies any chest pain, shortness of breath, chest discomfort.  She denies any history of heart failure, thyroid disease, liver disease, chronic kidney disease.  She denies any medication changes.  She does report increasing amount of sodium she is consuming she has been eating potato chips on a regular basis for the past few days.  Reports symptoms are improved today compared to baseline but she wanted to get checked out to ensure there is not something else going on.  She denies any recent illness.  She has not tried compression stockings or any other conservative treatment measures outside of elevation.     Past Medical History:  Diagnosis Date  . Asthma   . Dental abscess     There are no problems to display for this patient.   Past Surgical History:  Procedure Laterality Date  . TYMPANOSTOMY TUBE PLACEMENT      OB History   No obstetric history on file.      Home Medications    Prior to Admission medications   Medication Sig Start Date End Date Taking? Authorizing Provider  acetaminophen (TYLENOL) 500 MG tablet Take 1 tablet (500 mg total) by mouth every 6 (six) hours as needed. 01/31/18   Fayrene Helper, PA-C  cetirizine (ZYRTEC ALLERGY) 10 MG tablet Take 1 tablet (10 mg total) by mouth daily. Patient not taking:  Reported on 12/10/2020 02/28/20   Wallis Bamberg, PA-C  fluticasone [facility]) 50 MCG/ACT nasal spray Place 1-2 sprays into both nostrils daily for 7 days. Patient not taking: Reported on 12/10/2020 07/13/18 07/20/18  Wieters, Fran Lowes C, PA-C  predniSONE (DELTASONE) 50 MG tablet Take 1 tab daily with breakfast x 3 days Patient not taking: Reported on 12/10/2020 07/25/20   Particia Nearing, PA-C  pseudoephedrine (SUDAFED) 60 MG tablet Take 1 tablet (60 mg total) by mouth every 8 (eight) hours as needed for congestion. Patient not taking: Reported on 12/10/2020 02/28/20   Wallis Bamberg, PA-C    Family History Family History  Problem Relation Age of Onset  . Cancer Maternal Grandmother     Social History Social History   Tobacco Use  . Smoking status: Current Some Day Smoker  . Smokeless tobacco: Never Used  Vaping Use  . Vaping Use: Never used  Substance Use Topics  . Alcohol use: No  . Drug use: No     Allergies   Flagyl [metronidazole], Keflex [cephalexin], and Latex   Review of Systems Review of Systems  Constitutional: Negative for activity change, appetite change, fatigue and fever.  Respiratory: Positive for chest tightness. Negative for cough, shortness of breath and wheezing.   Cardiovascular: Positive for leg swelling. Negative for chest pain and palpitations.  Gastrointestinal: Negative for abdominal pain, diarrhea, nausea and vomiting.  Neurological: Negative for  dizziness, light-headedness and headaches.     Physical Exam Triage Vital Signs ED Triage Vitals  Enc Vitals Group     BP 12/10/20 1424 (!) 142/95     Pulse Rate 12/10/20 1424 65     Resp 12/10/20 1424 20     Temp 12/10/20 1424 98.8 F (37.1 C)     Temp Source 12/10/20 1424 Oral     SpO2 12/10/20 1424 100 %     Weight --      Height --      Head Circumference --      Peak Flow --      Pain Score 12/10/20 1421 0     Pain Loc --      Pain Edu? --      Excl. in GC? --    No data found.  Updated Vital  Signs BP (!) 142/95 (BP Location: Right Arm) Comment (BP Location): large cuff  Pulse 65   Temp 98.8 F (37.1 C) (Oral)   Resp 20   LMP 10/25/2020   SpO2 100%   Visual Acuity Right Eye Distance:   Left Eye Distance:   Bilateral Distance:    Right Eye Near:   Left Eye Near:    Bilateral Near:     Physical Exam Vitals reviewed.  Constitutional:      General: She is awake. She is not in acute distress.    Appearance: Normal appearance. She is not ill-appearing.     Comments: Very pleasant female appears stated age in no acute distress sitting comfortably in exam room  HENT:     Head: Normocephalic and atraumatic.  Cardiovascular:     Rate and Rhythm: Normal rate and regular rhythm.     Pulses:          Posterior tibial pulses are 2+ on the right side and 2+ on the left side.     Heart sounds: Normal heart sounds. No murmur heard.     Comments: Trace edema to ankle bilaterally Pulmonary:     Effort: Pulmonary effort is normal.     Breath sounds: Normal breath sounds. No wheezing, rhonchi or rales.     Comments: Clear to auscultation bilaterally Abdominal:     Palpations: Abdomen is soft.     Tenderness: There is no abdominal tenderness.  Musculoskeletal:     Right lower leg: No edema.     Left lower leg: No edema.  Psychiatric:        Behavior: Behavior is cooperative.      UC Treatments / Results  Labs (all labs ordered are listed, but only abnormal results are displayed) Labs Reviewed - No data to display  EKG   Radiology No results found.  Procedures Procedures (including critical care time)  Medications Ordered in UC Medications - No data to display  Initial Impression / Assessment and Plan / UC Course  I have reviewed the triage vital signs and the nursing notes.  Pertinent labs & imaging results that were available during my care of the patient were reviewed by me and considered in my medical decision making (see chart for details).     EKG  obtained given intermittent chest discomfort showed normal sinus rhythm without acute changes; compared to 07/02/2017 tracing normal sinus rhythm replaces sinus tachycardia with nonspecific ST changes in V1. Discussed that symptoms are likely related to dependent edema given improvement overnight.  Discussed treatment recommendations including keeping legs elevated, avoiding sodium, using compression stockings.  Discussed potential  utility of getting labs including CMP, CBC, BNP, thyroid studies the patient declined this today as she did not want to get blood drawn.  Ordered UA and urine pregnancy but patient left prior to having these collected and being discharged by nursing staff.  She will implement conservative treatment measures and if symptoms persist return for further evaluation at which point we will obtain lab work.  Discussed alarm symptoms that warrant emergent evaluation.  Strict return precautions given to which patient expressed understanding.  Final Clinical Impressions(s) / UC Diagnoses   Final diagnoses:  Peripheral edema  Pedal edema  Chest tightness     Discharge Instructions     Patient left prior to being discharged    ED Prescriptions    None     PDMP not reviewed this encounter.   Jeani Hawking, PA-C 12/10/20 1526

## 2020-12-10 NOTE — ED Triage Notes (Signed)
Bilateral ankle edema, dark urine, fatigue and dry mouth.  Patient also mentions center chest pain, "crampy-like"

## 2020-12-10 NOTE — ED Triage Notes (Signed)
Called pt to inform room was ready, no answer on phone.

## 2020-12-25 ENCOUNTER — Ambulatory Visit (HOSPITAL_COMMUNITY)
Admission: EM | Admit: 2020-12-25 | Discharge: 2020-12-25 | Disposition: A | Discharge status: Home / Self Care | Payer: Medicaid Other | Attending: Family Medicine | Admitting: Family Medicine

## 2020-12-25 ENCOUNTER — Other Ambulatory Visit: Payer: Self-pay

## 2020-12-25 ENCOUNTER — Encounter (HOSPITAL_COMMUNITY): Payer: Self-pay

## 2020-12-25 DIAGNOSIS — H1031 Unspecified acute conjunctivitis, right eye: Secondary | ICD-10-CM

## 2020-12-25 DIAGNOSIS — N939 Abnormal uterine and vaginal bleeding, unspecified: Secondary | ICD-10-CM

## 2020-12-25 DIAGNOSIS — J069 Acute upper respiratory infection, unspecified: Secondary | ICD-10-CM

## 2020-12-25 DIAGNOSIS — Z20822 Contact with and (suspected) exposure to covid-19: Secondary | ICD-10-CM | POA: Insufficient documentation

## 2020-12-25 DIAGNOSIS — F17200 Nicotine dependence, unspecified, uncomplicated: Secondary | ICD-10-CM | POA: Insufficient documentation

## 2020-12-25 LAB — POC URINE PREG, ED: Preg Test, Ur: NEGATIVE

## 2020-12-25 MED ORDER — CETIRIZINE-PSEUDOEPHEDRINE ER 5-120 MG PO TB12: 1.0000 | ORAL_TABLET | Freq: Every day | ORAL | 0 refills | Status: DC

## 2020-12-25 MED ORDER — IBUPROFEN 800 MG PO TABS: 800.0000 mg | ORAL_TABLET | Freq: Three times a day (TID) | ORAL | 0 refills | Status: DC | PRN

## 2020-12-25 MED ORDER — FLUTICASONE PROPIONATE 50 MCG/ACT NA SUSP: 2.0000 | Freq: Every day | NASAL | 2 refills | Status: DC

## 2020-12-25 MED ORDER — POLYMYXIN B-TRIMETHOPRIM 10000-0.1 UNIT/ML-% OP SOLN: 1.0000 [drp] | OPHTHALMIC | 0 refills | Status: AC

## 2020-12-25 NOTE — ED Triage Notes (Signed)
Pt presents with productive cough since yesterday, congestion, sore throat for past few days and bilateral eye drainage for past few days that has been unrelieved with OTC medication.

## 2020-12-25 NOTE — ED Provider Notes (Signed)
[facility]   [id number] 12/25/20 Arrival Time: 1507   CC: COVID symptoms  SUBJECTIVE: History from: patient.  Megan Valenzuela is a 40 y.o. female who presents with sore throat, nasal congestion, headache, fatigue for the last 3 days. Reports right eye irritation, redness, drainage for the last 3 days. Reports that her eyelids are matted when she wakes in the morning. Also reports no menstrual periods for the last 2 months. Denies sick exposure to COVID, flu or strep. Denies recent travel. Has negative history of Covid. Has not completed Covid vaccines. Has not taken OTC medications for this. There are no aggravating or alleviating factors. Denies previous symptoms in the past. Denies fever, chills, fatigue, rhinorrhea, SOB, wheezing, chest pain, nausea, changes in bowel or bladder habits.    ROS: As per HPI.  All other pertinent ROS negative.     Past Medical History:  Diagnosis Date   Asthma    Dental abscess    Past Surgical History:  Procedure Laterality Date   TYMPANOSTOMY TUBE PLACEMENT     Allergies  Allergen Reactions   Flagyl [Metronidazole] Other (See Comments)    headache   Keflex [Cephalexin] Other (See Comments)    Stomach Cramps Bruising   Latex Itching and Rash   No current facility-administered medications on file prior to encounter.   Current Outpatient Medications on File Prior to Encounter  Medication Sig Dispense Refill   acetaminophen (TYLENOL) 500 MG tablet Take 1 tablet (500 mg total) by mouth every 6 (six) hours as needed. 30 tablet 0   cetirizine (ZYRTEC ALLERGY) 10 MG tablet Take 1 tablet (10 mg total) by mouth Valenzuela. (Patient not taking: Reported on 12/10/2020) 30 tablet 0   predniSONE (DELTASONE) 50 MG tablet Take 1 tab Valenzuela with breakfast x 3 days (Patient not taking: Reported on 12/10/2020) 3 tablet 0   pseudoephedrine (SUDAFED) 60 MG tablet Take 1 tablet (60 mg total) by mouth every 8 (eight) hours as needed for congestion. (Patient  not taking: Reported on 12/10/2020) 30 tablet 0   Social History   Socioeconomic History   Marital status: Legally Separated    Spouse name: Not on file   Number of children: Not on file   Years of education: Not on file   Highest education level: Not on file  Occupational History   Not on file  Tobacco Use   Smoking status: Some Days    Pack years: 0.00   Smokeless tobacco: Never  Vaping Use   Vaping Use: Never used  Substance and Sexual Activity   Alcohol use: No   Drug use: No   Sexual activity: Not on file  Other Topics Concern   Not on file  Social History Narrative   Not on file   Social Determinants of Health   Financial Resource Strain: Not on file  Food Insecurity: Not on file  Transportation Needs: Not on file  Physical Activity: Not on file  Stress: Not on file  Social Connections: Not on file  Intimate Partner Violence: Not on file   Family History  Problem Relation Age of Onset   Cancer Maternal Grandmother     OBJECTIVE:  Vitals:   12/25/20 1643  BP: 140/89  Pulse: 91  Resp: 17  Temp: 99.3 F (37.4 C)  TempSrc: Oral  SpO2: 94%     General appearance: alert; appears fatigued, but nontoxic; speaking in full sentences and tolerating own secretions HEENT: NCAT; Ears: EACs clear, TMs pearly gray; Eyes: PERRL.  Right conjunctival erythema and yellow/green discharge EOM grossly intact. Sinuses: nontender; Nose: nares patent with clear rhinorrhea, Throat: oropharynx erythematous, cobblestoning present, tonsils non erythematous or enlarged, uvula midline  Neck: supple without LAD Lungs: unlabored respirations, symmetrical air entry; cough: absent; no respiratory distress; CTAB Heart: regular rate and rhythm.  Radial pulses 2+ symmetrical bilaterally Skin: warm and dry Psychological: alert and cooperative; normal mood and affect  LABS:  Results for orders placed or performed during the hospital encounter of 12/25/20 (from the past 24 hour(s))  POC  urine pregnancy     Status: None   Collection Time: 12/25/20  5:46 PM  Result Value Ref Range   Preg Test, Ur NEGATIVE NEGATIVE     ASSESSMENT & PLAN:  1. Viral URI with cough   2. Abnormal vaginal bleeding     Meds ordered this encounter  Medications   fluticasone (FLONASE) 50 MCG/ACT nasal spray    Sig: Place 2 sprays into both nostrils Valenzuela.    Dispense:  16 g    Refill:  2    Order Specific Question:   Supervising Provider    Answer:   Merrilee Jansky [[id number]]   cetirizine-pseudoephedrine (ZYRTEC-D) 5-120 MG tablet    Sig: Take 1 tablet by mouth Valenzuela.    Dispense:  30 tablet    Refill:  0    Order Specific Question:   Supervising Provider    Answer:   Merrilee Jansky [id number]   trimethoprim-polymyxin b (POLYTRIM) ophthalmic solution    Sig: Place 1 drop into the right eye every 4 (four) hours for 5 days.    Dispense:  10 mL    Refill:  0    Order Specific Question:   Supervising Provider    Answer:   Merrilee Jansky [id number]   ibuprofen (ADVIL) 800 MG tablet    Sig: Take 1 tablet (800 mg total) by mouth every 8 (eight) hours as needed for moderate pain.    Dispense:  21 tablet    Refill:  0    Order Specific Question:   Supervising Provider    Answer:   Merrilee Jansky [id number]    Prescribed ibuprofen  Prescribed zyrtec D Prescribed flonase Prescribed polytrim drops Continue supportive care at home COVID and flu testing ordered.  It will take between 2-3 days for test results. Someone will contact you regarding abnormal results.   Work note provided Patient should remain in quarantine until they have received Covid results.  If negative you may resume normal activities (go back to work/school) while practicing hand hygiene, social distance, and mask wearing.  If positive, patient should remain in quarantine for at least 5 days from symptom onset AND greater than 72 hours after symptoms resolution (absence of fever without the use of fever-reducing  medication and improvement in respiratory symptoms), whichever is longer Get plenty of rest and push fluids Use medications Valenzuela for symptom relief Use OTC medications like ibuprofen or tylenol as needed fever or pain Call or go to the ED if you have any new or worsening symptoms such as fever, worsening cough, shortness of breath, chest tightness, chest pain, turning blue, changes in mental status.  Reviewed expectations re: course of current medical issues. Questions answered. Outlined signs and symptoms indicating need for more acute intervention. Patient verbalized understanding. After Visit Summary given.          Moshe Cipro, NP 12/25/20 1845

## 2020-12-25 NOTE — Discharge Instructions (Addendum)
Pregnancy test today is negative  I have sent in zyrtec D for you to use for nasal congestion daily.  I have sent in flonase for you to use 2 sprays per nostril daily as needed for nasal congestion.  Your COVID and Influenza tests are pending. You should self quarantine until the test results are back.    Take Tylenol or ibuprofen as needed for fever or discomfort.  Rest and keep yourself hydrated.    Follow-up with your primary care provider if your symptoms are not improving.

## 2020-12-26 LAB — SARS CORONAVIRUS 2 (TAT 6-24 HRS): SARS Coronavirus 2: NEGATIVE

## 2020-12-31 ENCOUNTER — Ambulatory Visit (HOSPITAL_COMMUNITY)
Admission: EM | Admit: 2020-12-31 | Discharge: 2020-12-31 | Disposition: A | Discharge status: Home / Self Care | Payer: Medicaid Other

## 2020-12-31 ENCOUNTER — Encounter (HOSPITAL_COMMUNITY): Payer: Self-pay

## 2020-12-31 ENCOUNTER — Other Ambulatory Visit: Payer: Self-pay

## 2020-12-31 DIAGNOSIS — H66003 Acute suppurative otitis media without spontaneous rupture of ear drum, bilateral: Secondary | ICD-10-CM

## 2020-12-31 DIAGNOSIS — J029 Acute pharyngitis, unspecified: Secondary | ICD-10-CM | POA: Diagnosis not present

## 2020-12-31 MED ORDER — AMOXICILLIN-POT CLAVULANATE 875-125 MG PO TABS: 1.0000 | ORAL_TABLET | Freq: Two times a day (BID) | ORAL | 0 refills | Status: DC

## 2020-12-31 NOTE — ED Provider Notes (Signed)
[facility]    CSN: [id number] Arrival date & time: 12/31/20  1404      History   Chief Complaint Chief Complaint  Patient presents with   Sore Throat   Ear Fullness    HPI Megan Valenzuela is a 40 y.o. female.   HPI  Sore throat: Patient reports that she has had sore throat for about 2 weeks.  In addition she has had ear fullness and pain.  She states that symptoms started off as a typical respiratory infection but have not improved.  She states that the sore throat hurts at all times especially when she swallows or eats or drinks.  She has had chills but no known recorded fevers.  She also has had bilateral ear pain that is worse on the right side.  She has tried Zyrtec for symptoms without much relief.  Past Medical History:  Diagnosis Date   Asthma    Dental abscess     There are no problems to display for this patient.   Past Surgical History:  Procedure Laterality Date   TYMPANOSTOMY TUBE PLACEMENT      OB History   No obstetric history on file.      Home Medications    Prior to Admission medications   Medication Sig Start Date End Date Taking? Authorizing Provider  amoxicillin-clavulanate (AUGMENTIN) 875-125 MG tablet Take 1 tablet by mouth every 12 (twelve) hours. 12/31/20  Yes Yareth Kearse M, PA-C  cetirizine-pseudoephedrine (ZYRTEC-D) 5-120 MG tablet Take 1 tablet by mouth daily. 12/25/20  Yes Moshe Cipro, NP  fluticasone (FLONASE) 50 MCG/ACT nasal spray Place 2 sprays into both nostrils daily. 12/25/20  Yes Moshe Cipro, NP  ibuprofen (ADVIL) 800 MG tablet Take 1 tablet (800 mg total) by mouth every 8 (eight) hours as needed for moderate pain. 12/25/20  Yes Moshe Cipro, NP  UNABLE TO FIND Med Name: pt reports using eyedrops as needed but cannot remember name   Yes [provider]  acetaminophen (TYLENOL) 500 MG tablet Take 1 tablet (500 mg total) by mouth every 6 (six) hours as needed. 01/31/18   Fayrene Helper, PA-C  cetirizine (ZYRTEC ALLERGY) 10 MG tablet Take 1 tablet (10 mg total) by mouth daily. Patient not taking: Reported on 12/10/2020 02/28/20   Wallis Bamberg, PA-C  predniSONE (DELTASONE) 50 MG tablet Take 1 tab daily with breakfast x 3 days Patient not taking: Reported on 12/10/2020 07/25/20   Particia Nearing, PA-C  pseudoephedrine (SUDAFED) 60 MG tablet Take 1 tablet (60 mg total) by mouth every 8 (eight) hours as needed for congestion. Patient not taking: Reported on 12/10/2020 02/28/20   Wallis Bamberg, PA-C    Family History Family History  Problem Relation Age of Onset   Cancer Maternal Grandmother     Social History Social History   Tobacco Use   Smoking status: Former    Pack years: 0.00   Smokeless tobacco: Never  Vaping Use   Vaping Use: Never used  Substance Use Topics   Alcohol use: No   Drug use: No     Allergies   Flagyl [metronidazole], Keflex [cephalexin], and Latex   Review of Systems Review of Systems  As stated above in HPI Physical Exam Triage Vital Signs ED Triage Vitals  Enc Vitals Group     BP 12/31/20 1428 135/77     Pulse Rate 12/31/20 1428 86     Resp 12/31/20 1428 18     Temp 12/31/20 1428 98.6 F (  37 C)     Temp src --      SpO2 12/31/20 1428 96 %     Weight --      Height --      Head Circumference --      Peak Flow --      Pain Score 12/31/20 1425 5     Pain Loc --      Pain Edu? --      Excl. in GC? --    No data found.  Updated Vital Signs BP 135/77   Pulse 86   Temp 98.6 F (37 C)   Resp 18   LMP 12/30/2020   SpO2 96%   Physical Exam Vitals and nursing note reviewed.  Constitutional:      General: She is not in acute distress.    Appearance: She is well-developed. She is not ill-appearing, toxic-appearing or diaphoretic.  HENT:     Head: Normocephalic and atraumatic.     Right Ear: A middle ear effusion is present. Tympanic membrane is erythematous.     Left Ear: A middle ear effusion is present. Tympanic  membrane is erythematous.     Nose: Congestion present. No rhinorrhea.     Mouth/Throat:     Pharynx: Oropharyngeal exudate and posterior oropharyngeal erythema present.     Tonsils: No tonsillar abscesses.  Eyes:     Conjunctiva/sclera: Conjunctivae normal.     Pupils: Pupils are equal, round, and reactive to light.  Cardiovascular:     Rate and Rhythm: Normal rate and regular rhythm.  Pulmonary:     Effort: Pulmonary effort is normal.     Breath sounds: Normal breath sounds.  Musculoskeletal:     Cervical back: Neck supple.  Lymphadenopathy:     Cervical: No cervical adenopathy.  Skin:    General: Skin is warm.  Neurological:     Mental Status: She is alert and oriented to person, place, and time.     UC Treatments / Results  Labs (all labs ordered are listed, but only abnormal results are displayed) Labs Reviewed - No data to display  EKG   Radiology No results found.  Procedures Procedures (including critical care time)  Medications Ordered in UC Medications - No data to display  Initial Impression / Assessment and Plan / UC Course  I have reviewed the triage vital signs and the nursing notes.  Pertinent labs & imaging results that were available during my care of the patient were reviewed by me and considered in my medical decision making (see chart for details).     New. Treating with Augmentin and we discussed how to take this medication along with common potential side effects and precautions.  I have also recommended saline nasal spray or Flonase to help with her symptoms.  Discussed red flag signs and symptoms. Final Clinical Impressions(s) / UC Diagnoses   Final diagnoses:  Pharyngitis, unspecified etiology  Non-recurrent acute suppurative otitis media of both ears without spontaneous rupture of tympanic membranes   Discharge Instructions   None    ED Prescriptions     Medication Sig Dispense Auth. Provider   amoxicillin-clavulanate (AUGMENTIN)  875-125 MG tablet Take 1 tablet by mouth every 12 (twelve) hours. 14 tablet Rushie Chestnut, [place]      PDMP not reviewed this encounter.   Rushie Chestnut, [place] 12/31/20 1539

## 2020-12-31 NOTE — ED Triage Notes (Signed)
Pt reports persistent sore throat after recovering from URI, and feels right ear stopped up for 2 days.

## 2021-07-05 ENCOUNTER — Other Ambulatory Visit: Payer: Self-pay

## 2021-07-05 ENCOUNTER — Encounter (HOSPITAL_COMMUNITY): Payer: Self-pay

## 2021-07-05 ENCOUNTER — Ambulatory Visit (HOSPITAL_COMMUNITY)
Admission: EM | Admit: 2021-07-05 | Discharge: 2021-07-05 | Disposition: A | Discharge status: Home / Self Care | Payer: Medicaid Other | Attending: Internal Medicine | Admitting: Internal Medicine

## 2021-07-05 DIAGNOSIS — J040 Acute laryngitis: Secondary | ICD-10-CM

## 2021-07-05 NOTE — ED Triage Notes (Signed)
Pt c/o sore throat and post nasal drip x1wk and a rash to upper chest since yesterday.

## 2021-07-05 NOTE — Discharge Instructions (Signed)
Voice rest Stay hydrated If symptoms worsen, please return to urgent care If symptoms persist beyond next week please make an appointment with ENT to be evaluated. Take Tylenol or Motrin as needed for pain and/or fever. This is likely from a virus infection.

## 2021-07-09 NOTE — ED Provider Notes (Signed)
[facility]    CSN: [id number] Arrival date & time: 07/05/21  1735      History   Chief Complaint Chief Complaint  Patient presents with   Sore Throat   Rash    HPI Megan Valenzuela is a 41 y.o. female comes to the urgent care with 1 week history of sore throat and postnasal drainage.  Symptoms started a week ago and has been persistent.  Few days after patient symptom onset she started experiencing hoarseness of voice and she currently has lost her voice.  No pain on swallowing.  No fever or chills.  Patient works at a daycare.  Other kids have similar symptoms.  No shortness of breath or wheezing.  No nausea vomiting or diarrhea.  Patient also complains of nonpruritic rash over the upper chest and back.  This started a couple of days ago.  HPI  Past Medical History:  Diagnosis Date   Asthma    Dental abscess     There are no problems to display for this patient.   Past Surgical History:  Procedure Laterality Date   TYMPANOSTOMY TUBE PLACEMENT      OB History   No obstetric history on file.      Home Medications    Prior to Admission medications   Medication Sig Start Date End Date Taking? Authorizing Provider  acetaminophen (TYLENOL) 500 MG tablet Take 1 tablet (500 mg total) by mouth every 6 (six) hours as needed. 01/31/18   Fayrene Helper, PA-C  fluticasone (FLONASE) 50 MCG/ACT nasal spray Place 2 sprays into both nostrils daily. 12/25/20   Moshe Cipro, NP  ibuprofen (ADVIL) 800 MG tablet Take 1 tablet (800 mg total) by mouth every 8 (eight) hours as needed for moderate pain. 12/25/20   Moshe Cipro, NP  UNABLE TO FIND Med Name: pt reports using eyedrops as needed but cannot remember name    [provider]    Family History Family History  Problem Relation Age of Onset   Cancer Maternal Grandmother     Social History Social History   Tobacco Use   Smoking status: Former   Smokeless tobacco: Never  Haematologist Use: Never used  Substance Use Topics   Alcohol use: No   Drug use: No     Allergies   Flagyl [metronidazole], Keflex [cephalexin], and Latex   Review of Systems Review of Systems As per HPI  Physical Exam Triage Vital Signs ED Triage Vitals [07/05/21 1849]  Enc Vitals Group     BP      Pulse Rate 91     Resp 18     Temp 98.7 F (37.1 C)     Temp src      SpO2 98 %     Weight      Height      Head Circumference      Peak Flow      Pain Score 0     Pain Loc      Pain Edu?      Excl. in GC?    No data found.  Updated Vital Signs Pulse 91    Temp 98.7 F (37.1 C)    Resp 18    LMP 06/27/2021    SpO2 98%   Visual Acuity Right Eye Distance:   Left Eye Distance:   Bilateral Distance:    Right Eye Near:   Left Eye Near:    Bilateral Near:  Physical Exam Vitals and nursing note reviewed.  Constitutional:      General: She is not in acute distress.    Appearance: She is well-developed. She is not ill-appearing.  HENT:     Right Ear: Tympanic membrane normal.     Left Ear: Tympanic membrane normal.     Nose: No rhinorrhea.     Mouth/Throat:     Mouth: Mucous membranes are moist.     Pharynx: No posterior oropharyngeal erythema.  Cardiovascular:     Rate and Rhythm: Normal rate and regular rhythm.  Pulmonary:     Effort: Pulmonary effort is normal.     Breath sounds: Normal breath sounds.  Skin:    General: Skin is warm.     Findings: No rash.     Comments: Faint rash noted over the upper chest.  No erythema.  Rash is papular in nature  Neurological:     Mental Status: She is alert.     UC Treatments / Results  Labs (all labs ordered are listed, but only abnormal results are displayed) Labs Reviewed - No data to display  EKG   Radiology No results found.  Procedures Procedures (including critical care time)  Medications Ordered in UC Medications - No data to display  Initial Impression / Assessment and Plan / UC Course  I  have reviewed the triage vital signs and the nursing notes.  Pertinent labs & imaging results that were available during my care of the patient were reviewed by me and considered in my medical decision making (see chart for details).     1.  Acute viral laryngitis: Voice rest Maintain adequate hydration Humidifier use will help with hoarseness of voice Ibuprofen/Tylenol as needed for pain Return precautions given If symptoms does not improve after couple of weeks you may benefit from ENT evaluation.  Final Clinical Impressions(s) / UC Diagnoses   Final diagnoses:  Acute laryngitis     Discharge Instructions      Voice rest Stay hydrated If symptoms worsen, please return to urgent care If symptoms persist beyond next week please make an appointment with ENT to be evaluated. Take Tylenol or Motrin as needed for pain and/or fever. This is likely from a virus infection.    ED Prescriptions   None    PDMP not reviewed this encounter.   Merrilee Jansky, MD 07/09/21 [phone]

## 2021-07-14 ENCOUNTER — Ambulatory Visit (HOSPITAL_COMMUNITY)
Admission: EM | Admit: 2021-07-14 | Discharge: 2021-07-14 | Disposition: A | Discharge status: Home / Self Care | Payer: Medicaid Other | Attending: Physician Assistant | Admitting: Physician Assistant

## 2021-07-14 ENCOUNTER — Encounter (HOSPITAL_COMMUNITY): Payer: Self-pay | Admitting: *Deleted

## 2021-07-14 DIAGNOSIS — N898 Other specified noninflammatory disorders of vagina: Secondary | ICD-10-CM | POA: Diagnosis not present

## 2021-07-14 LAB — POC URINE PREG, ED: Preg Test, Ur: NEGATIVE

## 2021-07-14 LAB — POCT URINALYSIS DIPSTICK, ED / UC
Bilirubin Urine: NEGATIVE
Glucose, UA: NEGATIVE mg/dL
Hgb urine dipstick: NEGATIVE
Ketones, ur: NEGATIVE mg/dL
Leukocytes,Ua: NEGATIVE
Nitrite: NEGATIVE
Protein, ur: NEGATIVE mg/dL
Specific Gravity, Urine: 1.01 (ref 1.005–1.030)
Urobilinogen, UA: 0.2 mg/dL (ref 0.0–1.0)
pH: 5.5 (ref 5.0–8.0)

## 2021-07-14 LAB — HEPATITIS PANEL, ACUTE
HCV Ab: REACTIVE — AB
Hep A IgM: NONREACTIVE
Hep B C IgM: NONREACTIVE
Hepatitis B Surface Ag: NONREACTIVE

## 2021-07-14 LAB — HIV ANTIBODY (ROUTINE TESTING W REFLEX): HIV Screen 4th Generation wRfx: NONREACTIVE

## 2021-07-14 NOTE — ED Triage Notes (Addendum)
C/O intermittent left lower abdominal cramping x 1 wk. Reports vaginal spotting x 2 days. Denies fevers. Denies n/v/d. Wishes to be tested for STIs.

## 2021-07-14 NOTE — ED Provider Notes (Signed)
[facility]    CSN: [id number] Arrival date & time: 07/14/21  1008      History   Chief Complaint Chief Complaint  Patient presents with   Abdominal Pain    With vaginal spotting    HPI Megan Valenzuela is a 41 y.o. female.   Patient here today for evaluation of vaginal discharge and spotting that started about 2 days ago. She reports she has also had some left lower abdominal cramping for the last week. She does have irregular periods at baseline. She would like STD screening. She has not had any nausea, vomiting or diarrhea.   The history is provided by the patient.  Abdominal Pain Associated symptoms: vaginal bleeding and vaginal discharge   Associated symptoms: no chills, no diarrhea, no fever, no nausea, no shortness of breath and no vomiting    Past Medical History:  Diagnosis Date   Asthma    Dental abscess     There are no problems to display for this patient.   Past Surgical History:  Procedure Laterality Date   TYMPANOSTOMY TUBE PLACEMENT      OB History   No obstetric history on file.      Home Medications    Prior to Admission medications   Medication Sig Start Date End Date Taking? Authorizing Provider  acetaminophen (TYLENOL) 500 MG tablet Take 1 tablet (500 mg total) by mouth every 6 (six) hours as needed. 01/31/18   Fayrene Helper, PA-C  fluticasone (FLONASE) 50 MCG/ACT nasal spray Place 2 sprays into both nostrils daily. 12/25/20   Moshe Cipro, NP  ibuprofen (ADVIL) 800 MG tablet Take 1 tablet (800 mg total) by mouth every 8 (eight) hours as needed for moderate pain. 12/25/20   Moshe Cipro, NP  UNABLE TO FIND Med Name: pt reports using eyedrops as needed but cannot remember name    [provider]    Family History Family History  Problem Relation Age of Onset   Cancer Maternal Grandmother     Social History Social History   Tobacco Use   Smoking status: Former    Types: Cigarettes   Smokeless  tobacco: Never  Vaping Use   Vaping Use: Never used  Substance Use Topics   Alcohol use: No   Drug use: No     Allergies   Flagyl [metronidazole], Keflex [cephalexin], and Latex   Review of Systems Review of Systems  Constitutional:  Negative for chills and fever.  Eyes:  Negative for discharge and redness.  Respiratory:  Negative for shortness of breath.   Gastrointestinal:  Positive for abdominal pain. Negative for diarrhea, nausea and vomiting.  Genitourinary:  Positive for vaginal bleeding and vaginal discharge.    Physical Exam Triage Vital Signs ED Triage Vitals  Enc Vitals Group     BP 07/14/21 1052 121/82     Pulse Rate 07/14/21 1052 97     Resp 07/14/21 1052 18     Temp 07/14/21 1052 98.3 F (36.8 C)     Temp Source 07/14/21 1052 Oral     SpO2 07/14/21 1052 98 %     Weight --      Height --      Head Circumference --      Peak Flow --      Pain Score 07/14/21 1053 0     Pain Loc --      Pain Edu? --      Excl. in GC? --    No data  found.  Updated Vital Signs BP 121/82    Pulse 97    Temp 98.3 F (36.8 C) (Oral)    Resp 18    LMP 07/12/2021 (Exact Date) Comment: spotting onset 07/12/20   SpO2 98%      Physical Exam Vitals and nursing note reviewed.  Constitutional:      General: She is not in acute distress.    Appearance: Normal appearance. She is not ill-appearing.  HENT:     Head: Normocephalic and atraumatic.  Eyes:     Conjunctiva/sclera: Conjunctivae normal.  Cardiovascular:     Rate and Rhythm: Normal rate.  Pulmonary:     Effort: Pulmonary effort is normal.  Neurological:     Mental Status: She is alert.  Psychiatric:        Mood and Affect: Mood normal.        Behavior: Behavior normal.        Thought Content: Thought content normal.     UC Treatments / Results  Labs (all labs ordered are listed, but only abnormal results are displayed) Labs Reviewed  HIV ANTIBODY (ROUTINE TESTING W REFLEX)  RPR  HEPATITIS PANEL, ACUTE  POCT  URINALYSIS DIPSTICK, ED / UC  POC URINE PREG, ED  CERVICOVAGINAL ANCILLARY ONLY    EKG   Radiology No results found.  Procedures Procedures (including critical care time)  Medications Ordered in UC Medications - No data to display  Initial Impression / Assessment and Plan / UC Course  I have reviewed the triage vital signs and the nursing notes.  Pertinent labs & imaging results that were available during my care of the patient were reviewed by me and considered in my medical decision making (see chart for details).    STD screening ordered as requested. Will await results for further recommendation. Encouraged follow up with any further concerns.  Final Clinical Impressions(s) / UC Diagnoses   Final diagnoses:  Vaginal discharge   Discharge Instructions   None    ED Prescriptions   None    PDMP not reviewed this encounter.   Tomi Bamberger, PA-C 07/14/21 1200

## 2021-07-15 LAB — RPR: RPR Ser Ql: NONREACTIVE

## 2021-07-16 ENCOUNTER — Telehealth (HOSPITAL_COMMUNITY): Payer: Self-pay | Admitting: Emergency Medicine

## 2021-07-16 LAB — CERVICOVAGINAL ANCILLARY ONLY
Bacterial Vaginitis (gardnerella): POSITIVE — AB
Candida Glabrata: NEGATIVE
Candida Vaginitis: POSITIVE — AB
Chlamydia: NEGATIVE
Comment: NEGATIVE
Comment: NEGATIVE
Comment: NEGATIVE
Comment: NEGATIVE
Comment: NEGATIVE
Comment: NORMAL
Neisseria Gonorrhea: NEGATIVE
Trichomonas: NEGATIVE

## 2021-07-16 MED ORDER — METRONIDAZOLE 0.75 % VA GEL: 1.0000 | Freq: Every day | VAGINAL | 0 refills | Status: AC

## 2021-07-16 MED ORDER — FLUCONAZOLE 150 MG PO TABS: 150.0000 mg | ORAL_TABLET | Freq: Once | ORAL | 0 refills | Status: AC

## 2021-11-06 ENCOUNTER — Ambulatory Visit (HOSPITAL_COMMUNITY)
Admission: EM | Admit: 2021-11-06 | Discharge: 2021-11-06 | Disposition: A | Discharge status: Home / Self Care | Payer: Medicaid Other

## 2021-11-06 ENCOUNTER — Encounter (HOSPITAL_COMMUNITY): Payer: Self-pay

## 2021-11-06 DIAGNOSIS — J069 Acute upper respiratory infection, unspecified: Secondary | ICD-10-CM | POA: Diagnosis not present

## 2021-11-06 NOTE — ED Provider Notes (Signed)
?[facility] ? ? ? ?CSN: [id number] ?Arrival date & time: 11/06/21  1837 ? ? ?  ? ?History   ?Chief Complaint ?Chief Complaint  ?Patient presents with  ? Cough  ? ? ?HPI ?Megan Valenzuela is a 41 y.o. female.  ? ?Pt complains of 4 days of congestion, cough, and itchy throat.  She also complains of a small lump on the back of her right ear, without tenderness.  She denies fever, chills, n/v/d.  She has taken nothing for her sx.  ? ? ?Past Medical History:  ?Diagnosis Date  ? Asthma   ? Dental abscess   ? ? ?There are no problems to display for this patient. ? ? ?Past Surgical History:  ?Procedure Laterality Date  ? TYMPANOSTOMY TUBE PLACEMENT    ? ? ?OB History   ?No obstetric history on file. ?  ? ? ? ?Home Medications   ? ?Prior to Admission medications   ?Medication Sig Start Date End Date Taking? Authorizing Provider  ?acetaminophen (TYLENOL) 500 MG tablet Take 1 tablet (500 mg total) by mouth every 6 (six) hours as needed. 01/31/18   Domenic Moras, PA-C  ?fluticasone (FLONASE) 50 MCG/ACT nasal spray Place 2 sprays into both nostrils daily. 12/25/20   Faustino Congress, NP  ?ibuprofen (ADVIL) 800 MG tablet Take 1 tablet (800 mg total) by mouth every 8 (eight) hours as needed for moderate pain. 12/25/20   Faustino Congress, NP  ?UNABLE TO FIND Med Name: pt reports using eyedrops as needed but cannot remember name    [provider]  ? ? ?Family History ?Family History  ?Problem Relation Age of Onset  ? Diabetes Mother   ? Hypertension Mother   ? Cancer Maternal Grandmother   ? ? ?Social History ?Social History  ? ?Tobacco Use  ? Smoking status: Former  ?  Types: Cigarettes  ? Smokeless tobacco: Never  ?Vaping Use  ? Vaping Use: Never used  ?Substance Use Topics  ? Alcohol use: No  ? Drug use: No  ? ? ? ?Allergies   ?Flagyl [metronidazole], Keflex [cephalexin], and Latex ? ? ?Review of Systems ?Review of Systems  ?Constitutional:  Negative for chills and fever.  ?HENT:  Positive for congestion.  Negative for ear pain and sore throat.   ?Eyes:  Negative for pain and visual disturbance.  ?Respiratory:  Positive for cough. Negative for shortness of breath.   ?Cardiovascular:  Negative for chest pain and palpitations.  ?Gastrointestinal:  Negative for abdominal pain and vomiting.  ?Genitourinary:  Negative for dysuria and hematuria.  ?Musculoskeletal:  Negative for arthralgias and back pain.  ?Skin:  Negative for color change and rash.  ?Neurological:  Negative for seizures and syncope.  ?All other systems reviewed and are negative. ? ? ?Physical Exam ?Triage Vital Signs ?ED Triage Vitals  ?Enc Vitals Group  ?   BP 11/06/21 1910 (!) 135/92  ?   Pulse Rate 11/06/21 1910 81  ?   Resp 11/06/21 1910 18  ?   Temp 11/06/21 1910 98.5 ?F (36.9 ?C)  ?   Temp Source 11/06/21 1910 Oral  ?   SpO2 11/06/21 1910 100 %  ?   Weight --   ?   Height --   ?   Head Circumference --   ?   Peak Flow --   ?   Pain Score 11/06/21 1912 0  ?   Pain Loc --   ?   Pain Edu? --   ?  Excl. in [place]? --   ? ?No data found. ? ?Updated Vital Signs ?BP (!) 135/92 (BP Location: Right Arm)   Pulse 81   Temp 98.5 ?F (36.9 ?C) (Oral)   Resp 18   SpO2 100%  ? ?Visual Acuity ?Right Eye Distance:   ?Left Eye Distance:   ?Bilateral Distance:   ? ?Right Eye Near:   ?Left Eye Near:    ?Bilateral Near:    ? ?Physical Exam ?Vitals and nursing note reviewed.  ?Constitutional:   ?   General: She is not in acute distress. ?   Appearance: She is well-developed.  ?HENT:  ?   Head: Normocephalic and atraumatic.  ?   Ears:  ?   Comments: Posterior auricular lymph node swelling without tenderness, mobile.  ?Eyes:  ?   Conjunctiva/sclera: Conjunctivae normal.  ?Cardiovascular:  ?   Rate and Rhythm: Normal rate and regular rhythm.  ?   Heart sounds: No murmur heard. ?Pulmonary:  ?   Effort: Pulmonary effort is normal. No respiratory distress.  ?   Breath sounds: Normal breath sounds.  ?Abdominal:  ?   Palpations: Abdomen is soft.  ?   Tenderness: There is no  abdominal tenderness.  ?Musculoskeletal:     ?   General: No swelling.  ?   Cervical back: Neck supple.  ?Skin: ?   General: Skin is warm and dry.  ?   Capillary Refill: Capillary refill takes less than 2 seconds.  ?Neurological:  ?   Mental Status: She is alert.  ?Psychiatric:     ?   Mood and Affect: Mood normal.  ? ? ? ?UC Treatments / Results  ?Labs ?(all labs ordered are listed, but only abnormal results are displayed) ?Labs Reviewed - No data to display ? ?EKG ? ? ?Radiology ?No results found. ? ?Procedures ?Procedures (including critical care time) ? ?Medications Ordered in UC ?Medications - No data to display ? ?Initial Impression / Assessment and Plan / UC Course  ?I have reviewed the triage vital signs and the nursing notes. ? ?Pertinent labs & imaging results that were available during my care of the patient were reviewed by me and considered in my medical decision making (see chart for details). ? ?  ? ?Viral URI with cough, vitals wnl, pt stable for discharge.  Advised Mucinex and flonase.  Return precautions discussed.  ?Final Clinical Impressions(s) / UC Diagnoses  ? ?Final diagnoses:  ?Viral URI with cough  ? ? ? ?Discharge Instructions   ? ?  ?Recommend Mucinex and Flonase ?Take Ibuprofen as needed  ?Drink plenty of fluids  ? ? ? ?ED Prescriptions   ?None ?  ? ?PDMP not reviewed this encounter. ?  ?Ward, Lenise Arena, PA-C ?11/19/21 1918 ? ?

## 2021-11-06 NOTE — Discharge Instructions (Signed)
Recommend Mucinex and Flonase ?Take Ibuprofen as needed  ?Drink plenty of fluids  ? ?

## 2021-11-06 NOTE — ED Triage Notes (Signed)
4 day h/o congestion, cough and itchy throat. 3 day h/o non pruritic bump on the back of right ear. Lesion is not tender to the touch. No meds taken. No v/d. ?

## 2021-11-19 ENCOUNTER — Other Ambulatory Visit: Payer: Self-pay

## 2021-11-19 ENCOUNTER — Encounter (HOSPITAL_COMMUNITY): Payer: Self-pay | Admitting: Emergency Medicine

## 2021-11-19 ENCOUNTER — Emergency Department (HOSPITAL_COMMUNITY)
Admission: EM | Admit: 2021-11-19 | Discharge: 2021-11-19 | Disposition: A | Discharge status: Home / Self Care | Payer: Medicaid Other | Attending: Emergency Medicine | Admitting: Emergency Medicine

## 2021-11-19 DIAGNOSIS — S80812A Abrasion, left lower leg, initial encounter: Secondary | ICD-10-CM | POA: Diagnosis not present

## 2021-11-19 DIAGNOSIS — S2002XA Contusion of left breast, initial encounter: Secondary | ICD-10-CM | POA: Diagnosis not present

## 2021-11-19 DIAGNOSIS — S8012XA Contusion of left lower leg, initial encounter: Secondary | ICD-10-CM | POA: Diagnosis not present

## 2021-11-19 DIAGNOSIS — Z91040 Latex allergy status: Secondary | ICD-10-CM | POA: Diagnosis not present

## 2021-11-19 DIAGNOSIS — W2211XA Striking against or struck by driver side automobile airbag, initial encounter: Secondary | ICD-10-CM | POA: Diagnosis not present

## 2021-11-19 DIAGNOSIS — Y999 Unspecified external cause status: Secondary | ICD-10-CM | POA: Diagnosis not present

## 2021-11-19 DIAGNOSIS — Y9389 Activity, other specified: Secondary | ICD-10-CM | POA: Diagnosis not present

## 2021-11-19 DIAGNOSIS — Y92410 Unspecified street and highway as the place of occurrence of the external cause: Secondary | ICD-10-CM | POA: Diagnosis not present

## 2021-11-19 DIAGNOSIS — S8992XA Unspecified injury of left lower leg, initial encounter: Secondary | ICD-10-CM | POA: Diagnosis present

## 2021-11-19 DIAGNOSIS — T148XXA Other injury of unspecified body region, initial encounter: Secondary | ICD-10-CM
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
## 2022-03-03 ENCOUNTER — Ambulatory Visit (HOSPITAL_COMMUNITY)
Admission: EM | Admit: 2022-03-03 | Discharge: 2022-03-03 | Disposition: A | Discharge status: Home / Self Care | Payer: Medicaid Other | Attending: Emergency Medicine | Admitting: Emergency Medicine

## 2022-03-03 ENCOUNTER — Encounter (HOSPITAL_COMMUNITY): Payer: Self-pay

## 2022-03-03 DIAGNOSIS — R110 Nausea: Secondary | ICD-10-CM | POA: Diagnosis not present

## 2022-03-03 DIAGNOSIS — J020 Streptococcal pharyngitis: Secondary | ICD-10-CM | POA: Diagnosis not present

## 2022-03-03 LAB — POC URINE PREG, ED: Preg Test, Ur: NEGATIVE

## 2022-03-03 LAB — POCT RAPID STREP A, ED / UC: Streptococcus, Group A Screen (Direct): POSITIVE — AB

## 2022-03-03 MED ORDER — ONDANSETRON 4 MG PO TBDP: 4.0000 mg | ORAL_TABLET | Freq: Three times a day (TID) | ORAL | 0 refills | Status: AC | PRN

## 2022-03-03 MED ORDER — AMOXICILLIN 500 MG PO CAPS: 500.0000 mg | ORAL_CAPSULE | Freq: Two times a day (BID) | ORAL | 0 refills | Status: AC

## 2022-03-03 MED ORDER — ONDANSETRON 4 MG PO TBDP
ORAL_TABLET | ORAL | Status: AC
  Filled 2022-03-03: qty 1

## 2022-03-03 MED ORDER — ONDANSETRON 4 MG PO TBDP
4.0000 mg | ORAL_TABLET | Freq: Once | ORAL | Status: AC
  Administered 2022-03-03: 4 mg via ORAL

## 2022-03-03 NOTE — ED Triage Notes (Signed)
Pt is here for sore throat, night sweats and nausea , painful to swallow x2days

## 2022-03-03 NOTE — Discharge Instructions (Addendum)
Please take the medication as prescribed. If you can take with food, it can help prevent upset stomach. You can take the Zofran about 30 minutes before eating to help settle the stomach.  Try to drink lots of water and stay hydrated.  You are contagious until 24 hours on the antibiotic.

## 2022-03-03 NOTE — ED Provider Notes (Signed)
[facility]    CSN: [id number] Arrival date & time: 03/03/22  1645     History   Chief Complaint Chief Complaint  Patient presents with   Sore Throat   Nausea    HPI Megan Valenzuela is a 41 y.o. female.  Presents with two day history of sore throat, chills, nausea. Pain with swallowing, reports 10/10. Has tried salt water gargles.  Reports persistent nausea. No vomiting/diarrhea. LMP 6/23, denies possibility of pregnancy.   Past Medical History:  Diagnosis Date   Asthma    Dental abscess     There are no problems to display for this patient.   Past Surgical History:  Procedure Laterality Date   TYMPANOSTOMY TUBE PLACEMENT      OB History   No obstetric history on file.      Home Medications    Prior to Admission medications   Medication Sig Start Date End Date Taking? Authorizing Provider  amoxicillin (AMOXIL) 500 MG capsule Take 1 capsule (500 mg total) by mouth 2 (two) times daily for 10 days. 03/03/22 03/13/22 Yes Billiejean Schimek, Lurena Joiner, PA-C  ondansetron (ZOFRAN-ODT) 4 MG disintegrating tablet Take 1 tablet (4 mg total) by mouth every 8 (eight) hours as needed for up to 3 days for nausea or vomiting. 03/03/22 03/06/22 Yes Hani Campusano, Lurena Joiner, PA-C  acetaminophen (TYLENOL) 500 MG tablet Take 1 tablet (500 mg total) by mouth every 6 (six) hours as needed. 01/31/18   Fayrene Helper, PA-C  fluticasone (FLONASE) 50 MCG/ACT nasal spray Place 2 sprays into both nostrils daily. 12/25/20   Moshe Cipro, NP  ibuprofen (ADVIL) 800 MG tablet Take 1 tablet (800 mg total) by mouth every 8 (eight) hours as needed for moderate pain. 12/25/20   Moshe Cipro, NP  UNABLE TO FIND Med Name: pt reports using eyedrops as needed but cannot remember name    [provider]    Family History Family History  Problem Relation Age of Onset   Diabetes Mother    Hypertension Mother    Cancer Maternal Grandmother     Social History Social History   Tobacco Use    Smoking status: Former    Types: Cigarettes   Smokeless tobacco: Never  Vaping Use   Vaping Use: Never used  Substance Use Topics   Alcohol use: No   Drug use: No     Allergies   Flagyl [metronidazole], Keflex [cephalexin], and Latex   Review of Systems Review of Systems Per HPI  Physical Exam Triage Vital Signs ED Triage Vitals  Enc Vitals Group     BP 03/03/22 1721 (!) 146/105     Pulse Rate 03/03/22 1721 (!) 122     Resp 03/03/22 1721 12     Temp 03/03/22 1721 99 F (37.2 C)     Temp Source 03/03/22 1721 Oral     SpO2 03/03/22 1721 98 %     Weight 03/03/22 1720 280 lb (127 kg)     Height 03/03/22 1720 5\' 8"  (1.727 m)     Head Circumference --      Peak Flow --      Pain Score 03/03/22 1719 10     Pain Loc --      Pain Edu? --      Excl. in [place]? --    No data found.  Updated Vital Signs BP 117/69 (BP Location: Right Arm)   Pulse 82   Temp 99 F (37.2 C) (Oral)   Resp 12  Ht 5\' 8"  (1.727 m)   Wt 280 lb (127 kg)   LMP 12/28/2021   SpO2 98%   BMI 42.57 kg/m    Physical Exam Vitals and nursing note reviewed.  Constitutional:      General: She is not in acute distress. HENT:     Mouth/Throat:     Mouth: Mucous membranes are moist. No oral lesions.     Pharynx: Uvula midline. Posterior oropharyngeal erythema present. No uvula swelling.     Tonsils: Tonsillar exudate present. No tonsillar abscesses. 2+ on the right. 2+ on the left.  Eyes:     Conjunctiva/sclera: Conjunctivae normal.  Cardiovascular:     Rate and Rhythm: Normal rate and regular rhythm.     Pulses: Normal pulses.     Heart sounds: Normal heart sounds.  Pulmonary:     Effort: Pulmonary effort is normal.     Breath sounds: Normal breath sounds.  Lymphadenopathy:     Cervical: No cervical adenopathy.  Skin:    General: Skin is warm and dry.  Neurological:     Mental Status: She is alert and oriented to person, place, and time.     UC Treatments / Results  Labs (all labs  ordered are listed, but only abnormal results are displayed) Labs Reviewed  POCT RAPID STREP A, ED / UC - Abnormal; Notable for the following components:      Result Value   Streptococcus, Group A Screen (Direct) POSITIVE (*)    All other components within normal limits  POC URINE PREG, ED    EKG  Radiology No results found.  Procedures Procedures   Medications Ordered in UC Medications  ondansetron (ZOFRAN-ODT) disintegrating tablet 4 mg (4 mg Oral Given 03/03/22 1800)    Initial Impression / Assessment and Plan / UC Course  I have reviewed the triage vital signs and the nursing notes.  Pertinent labs & imaging results that were available during my care of the patient were reviewed by me and considered in my medical decision making (see chart for details).  Afebrile here but tachycardic. Urine preg negative  Strep test positive.  Zofran dose given for nausea. Patient reports improvement. Tolerating water in clinic.  Amox twice daily for 10 days. Zofran sent for nausea.  Return precautions discussed. Patient agrees to plan  Final Clinical Impressions(s) / UC Diagnoses   Final diagnoses:  Strep throat  Nausea     Discharge Instructions      Please take the medication as prescribed. If you can take with food, it can help prevent upset stomach. You can take the Zofran about 30 minutes before eating to help settle the stomach.  Try to drink lots of water and stay hydrated.  You are contagious until 24 hours on the antibiotic.     ED Prescriptions     Medication Sig Dispense Auth. Provider   ondansetron (ZOFRAN-ODT) 4 MG disintegrating tablet Take 1 tablet (4 mg total) by mouth every 8 (eight) hours as needed for up to 3 days for nausea or vomiting. 9 tablet Llewellyn Choplin, PA-C   amoxicillin (AMOXIL) 500 MG capsule Take 1 capsule (500 mg total) by mouth 2 (two) times daily for 10 days. 20 capsule Sema Stangler, 03/05/22, PA-C      PDMP not reviewed this  encounter.   Lurena Joiner, Marlow Baars 03/03/22 1814

## 2022-06-23 ENCOUNTER — Encounter (HOSPITAL_COMMUNITY): Payer: Self-pay

## 2022-06-23 ENCOUNTER — Ambulatory Visit (HOSPITAL_COMMUNITY)
Admission: EM | Admit: 2022-06-23 | Discharge: 2022-06-23 | Disposition: A | Discharge status: Home / Self Care | Payer: Medicaid Other | Attending: Family Medicine | Admitting: Family Medicine

## 2022-06-23 DIAGNOSIS — R059 Cough, unspecified: Secondary | ICD-10-CM | POA: Diagnosis not present

## 2022-06-23 DIAGNOSIS — Z7952 Long term (current) use of systemic steroids: Secondary | ICD-10-CM | POA: Insufficient documentation

## 2022-06-23 DIAGNOSIS — Z1152 Encounter for screening for COVID-19: Secondary | ICD-10-CM | POA: Diagnosis not present

## 2022-06-23 DIAGNOSIS — J4521 Mild intermittent asthma with (acute) exacerbation: Secondary | ICD-10-CM | POA: Diagnosis not present

## 2022-06-23 DIAGNOSIS — J069 Acute upper respiratory infection, unspecified: Secondary | ICD-10-CM | POA: Diagnosis present

## 2022-06-23 DIAGNOSIS — Z79899 Other long term (current) drug therapy: Secondary | ICD-10-CM | POA: Diagnosis not present

## 2022-06-23 MED ORDER — PREDNISONE 20 MG PO TABS: 40.0000 mg | ORAL_TABLET | Freq: Every day | ORAL | 0 refills | Status: AC

## 2022-06-23 MED ORDER — ALBUTEROL SULFATE HFA 108 (90 BASE) MCG/ACT IN AERS: 2.0000 | INHALATION_SPRAY | RESPIRATORY_TRACT | 0 refills | Status: DC | PRN

## 2022-06-23 NOTE — ED Triage Notes (Signed)
Patient with c/o cough, congestion and fatigue since Monday. Denies fever. Asking for COVID test.

## 2022-06-23 NOTE — Discharge Instructions (Signed)
Albuterol inhaler--do 2 puffs every 4 hours as needed for shortness of breath or wheezing  Take prednisone 20 mg--2 daily for 5 days   You have been swabbed for COVID, and the test will result in the next 24 hours. Our staff will call you if positive. If the COVID test is positive, you should quarantine for 5 days from the start of your symptoms   

## 2022-06-23 NOTE — ED Provider Notes (Signed)
[facility]    CSN: [id number] Arrival date & time: 06/23/22  1009      History   Chief Complaint Chief Complaint  Patient presents with   Cough    HPI Megan Valenzuela is a 41 y.o. female.    Cough  Here for cough, congestion and rhinorrhea, sore throat, and fever and chills.  Symptoms began on the evening of December 11.  She has not had any nausea or vomiting or diarrhea.  She has had a little bit of wheezing when she lies down in the last couple of days.  She does have a remote history of asthma.  She has had some myalgia and feeling very tired.  Otherwise she has not had any new symptoms in the last 2 days  Past Medical History:  Diagnosis Date   Asthma    Dental abscess     There are no problems to display for this patient.   Past Surgical History:  Procedure Laterality Date   TYMPANOSTOMY TUBE PLACEMENT      OB History   No obstetric history on file.      Home Medications    Prior to Admission medications   Medication Sig Start Date End Date Taking? Authorizing Provider  albuterol (VENTOLIN HFA) 108 (90 Base) MCG/ACT inhaler Inhale 2 puffs into the lungs every 4 (four) hours as needed for wheezing or shortness of breath. 06/23/22  Yes Zenia Resides, MD  predniSONE (DELTASONE) 20 MG tablet Take 2 tablets (40 mg total) by mouth daily with breakfast for 5 days. 06/23/22 06/28/22 Yes Valen Gillison, Janace Aris, MD    Family History Family History  Problem Relation Age of Onset   Diabetes Mother    Hypertension Mother    Cancer Maternal Grandmother     Social History Social History   Tobacco Use   Smoking status: Former    Types: Cigarettes   Smokeless tobacco: Never  Vaping Use   Vaping Use: Never used  Substance Use Topics   Alcohol use: No   Drug use: No     Allergies   Flagyl [metronidazole], Keflex [cephalexin], and Latex   Review of Systems Review of Systems  Respiratory:  Positive for cough.      Physical  Exam Triage Vital Signs ED Triage Vitals  Enc Vitals Group     BP 06/23/22 1054 (!) 133/90     Pulse Rate 06/23/22 1054 94     Resp 06/23/22 1054 18     Temp 06/23/22 1054 98.7 F (37.1 C)     Temp Source 06/23/22 1054 Oral     SpO2 06/23/22 1054 96 %     Weight --      Height --      Head Circumference --      Peak Flow --      Pain Score 06/23/22 1055 0     Pain Loc --      Pain Edu? --      Excl. in GC? --    No data found.  Updated Vital Signs BP (!) 133/90 (BP Location: Left Arm)   Pulse 94   Temp 98.7 F (37.1 C) (Oral)   Resp 18   LMP 05/21/2022 (Exact Date)   SpO2 96%   Visual Acuity Right Eye Distance:   Left Eye Distance:   Bilateral Distance:    Right Eye Near:   Left Eye Near:    Bilateral Near:     Physical Exam Vitals  reviewed.  Constitutional:      General: She is not in acute distress.    Appearance: She is not toxic-appearing.  HENT:     Right Ear: Tympanic membrane and ear canal normal.     Left Ear: Tympanic membrane and ear canal normal.     Nose: Nose normal.     Mouth/Throat:     Mouth: Mucous membranes are moist.     Comments: Clear mucus is draining in the oropharynx Eyes:     Extraocular Movements: Extraocular movements intact.     Conjunctiva/sclera: Conjunctivae normal.     Pupils: Pupils are equal, round, and reactive to light.  Cardiovascular:     Rate and Rhythm: Normal rate and regular rhythm.     Heart sounds: No murmur heard. Pulmonary:     Effort: No respiratory distress.     Breath sounds: No stridor. No wheezing, rhonchi or rales.  Musculoskeletal:     Cervical back: Neck supple.  Lymphadenopathy:     Cervical: No cervical adenopathy.  Skin:    Capillary Refill: Capillary refill takes less than 2 seconds.     Coloration: Skin is not jaundiced or pale.  Neurological:     General: No focal deficit present.     Mental Status: She is alert and oriented to person, place, and time.  Psychiatric:        Behavior:  Behavior normal.      UC Treatments / Results  Labs (all labs ordered are listed, but only abnormal results are displayed) Labs Reviewed  SARS CORONAVIRUS 2 (TAT 6-24 HRS)    EKG   Radiology No results found.  Procedures Procedures (including critical care time)  Medications Ordered in UC Medications - No data to display  Initial Impression / Assessment and Plan / UC Course  I have reviewed the triage vital signs and the nursing notes.  Pertinent labs & imaging results that were available during my care of the patient were reviewed by me and considered in my medical decision making (see chart for details).        COVID swab is done at her request.  If she is positive she will know if she needs to do further quarantine.  I discussed with her that she is outside the window for treatment for flu, and that she is also outside the window for any antiviral treatment for COVID.  Albuterol and prednisone are sent for probable asthma exacerbation. Final Clinical Impressions(s) / UC Diagnoses   Final diagnoses:  Viral URI with cough  Mild intermittent asthma with exacerbation     Discharge Instructions      Albuterol inhaler--do 2 puffs every 4 hours as needed for shortness of breath or wheezing  Take prednisone 20 mg--2 daily for 5 days   You have been swabbed for COVID, and the test will result in the next 24 hours. Our staff will call you if positive. If the COVID test is positive, you should quarantine for 5 days from the start of your symptoms      ED Prescriptions     Medication Sig Dispense Auth. Provider   albuterol (VENTOLIN HFA) 108 (90 Base) MCG/ACT inhaler Inhale 2 puffs into the lungs every 4 (four) hours as needed for wheezing or shortness of breath. 1 each Zenia Resides, MD   predniSONE (DELTASONE) 20 MG tablet Take 2 tablets (40 mg total) by mouth daily with breakfast for 5 days. 10 tablet Zenia Resides, MD  PDMP not reviewed this  encounter.   Zenia Resides, MD 06/23/22 1130

## 2022-06-24 LAB — SARS CORONAVIRUS 2 (TAT 6-24 HRS): SARS Coronavirus 2: NEGATIVE
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
## 2022-07-11 ENCOUNTER — Encounter (HOSPITAL_COMMUNITY): Payer: Self-pay

## 2022-07-11 ENCOUNTER — Ambulatory Visit (HOSPITAL_COMMUNITY)
Admission: EM | Admit: 2022-07-11 | Discharge: 2022-07-11 | Disposition: A | Discharge status: Home / Self Care | Payer: Medicaid Other | Attending: Emergency Medicine | Admitting: Emergency Medicine

## 2022-07-11 DIAGNOSIS — Z3201 Encounter for pregnancy test, result positive: Secondary | ICD-10-CM | POA: Diagnosis not present

## 2022-07-11 DIAGNOSIS — R112 Nausea with vomiting, unspecified: Secondary | ICD-10-CM | POA: Diagnosis not present

## 2022-07-11 LAB — POC URINE PREG, ED: Preg Test, Ur: POSITIVE — AB

## 2022-07-11 MED ORDER — VITAMIN B-6 50 MG PO TABS: 50.0000 mg | ORAL_TABLET | Freq: Two times a day (BID) | ORAL | 0 refills | Status: DC

## 2022-07-11 NOTE — Discharge Instructions (Addendum)
You may start taking vitamin B6 2 times daily, in the morning and in the evening to help with nausea and vomiting.   I have attached information for a GYN doctor to your discharge paperwork, please call and schedule an appointment as soon as possible.

## 2022-07-11 NOTE — ED Triage Notes (Signed)
Chief Complaint: emesis and possible pregnancy.   Onset: Monday   Prescriptions or OTC medications tried: No    Sick exposure: No  New foods, medications, or products: No  Recent Travel: No

## 2022-07-11 NOTE — ED Provider Notes (Signed)
[facility]    CSN: [id number] Arrival date & time: 07/11/22  1927      History   Chief Complaint Chief Complaint  Patient presents with   Emesis   Possible Pregnancy    HPI Megan Valenzuela is a 42 y.o. female.  Patient presents due to possible pregnancy and emesis that started 3 days ago.  Patient reports that she has had 3 positive pregnancy test at home.  She reports that her last menstrual period was 05/21/2022.  She states that she had 2 episodes of spotting which has now resolved.She  reports some lower mid abdominal cramping which has resolved.  Patient denies any lightheadedness, fever, chills or dizziness. Patient has not taken any over-the-counter medications for symptoms.    Emesis Possible Pregnancy    Past Medical History:  Diagnosis Date   Asthma    Dental abscess     There are no problems to display for this patient.   Past Surgical History:  Procedure Laterality Date   TYMPANOSTOMY TUBE PLACEMENT      OB History   No obstetric history on file.      Home Medications    Prior to Admission medications   Medication Sig Start Date End Date Taking? Authorizing Provider  pyridOXINE (VITAMIN B6) 50 MG tablet Take 1 tablet (50 mg total) by mouth in the morning and at bedtime. 07/11/22  Yes Flossie Dibble, NP  albuterol (VENTOLIN HFA) 108 (90 Base) MCG/ACT inhaler Inhale 2 puffs into the lungs every 4 (four) hours as needed for wheezing or shortness of breath. 06/23/22   Barrett Henle, MD    Family History Family History  Problem Relation Age of Onset   Diabetes Mother    Hypertension Mother    Cancer Maternal Grandmother     Social History Social History   Tobacco Use   Smoking status: Former    Types: Cigarettes   Smokeless tobacco: Never  Vaping Use   Vaping Use: Never used  Substance Use Topics   Alcohol use: No   Drug use: No     Allergies   Flagyl [metronidazole], Keflex [cephalexin], and  Latex   Review of Systems Review of Systems Per HPI Physical Exam Triage Vital Signs ED Triage Vitals  Enc Vitals Group     BP 07/11/22 2047 119/79     Pulse Rate 07/11/22 2047 73     Resp 07/11/22 2047 16     Temp 07/11/22 2047 98.1 F (36.7 C)     Temp Source 07/11/22 2047 Oral     SpO2 07/11/22 2047 98 %     Weight 07/11/22 2046 279 lb 15.8 oz (127 kg)     Height 07/11/22 2046 5\' 8"  (1.727 m)     Head Circumference --      Peak Flow --      Pain Score 07/11/22 2046 4     Pain Loc --      Pain Edu? --      Excl. in [place]? --    No data found.  Updated Vital Signs BP 119/79 (BP Location: Right Arm)   Pulse 73   Temp 98.1 F (36.7 C) (Oral)   Resp 16   Ht 5\' 8"  (1.727 m)   Wt 279 lb 15.8 oz (127 kg)   LMP 05/21/2022 (Exact Date)   SpO2 98%   BMI 42.57 kg/m    Physical Exam Vitals and nursing note reviewed.  Constitutional:  Appearance: Normal appearance.  Abdominal:     Comments: Deferred exam   Genitourinary:    Comments: Deferred exam  Neurological:     Mental Status: She is alert.      UC Treatments / Results  Labs (all labs ordered are listed, but only abnormal results are displayed) Labs Reviewed  POC URINE PREG, ED - Abnormal; Notable for the following components:      Result Value   Preg Test, Ur POSITIVE (*)    All other components within normal limits  POC URINE PREG, ED    EKG   Radiology No results found.  Procedures Procedures (including critical care time)  Medications Ordered in UC Medications - No data to display  Initial Impression / Assessment and Plan / UC Course  I have reviewed the triage vital signs and the nursing notes.  Pertinent labs & imaging results that were available during my care of the patient were reviewed by me and considered in my medical decision making (see chart for details).     Patient was evaluated for pregnancy and emesis.  POC pregnancy was positive.  Vitamin B6 was prescribed for nausea  and vomiting.  Patient was given education and list of medicines that are safe in pregnancy.  Intermittent spotting and intermittent cramping has now resolved, this may have been due to an implantation bleed, based on symptomology does not appear to need emergent MAU evaluation.  Patient was given information for GYN providers, she was made aware that she is need to establish care as soon as possible.  Patient verbalized understanding of instructions.  Charting was provided using a a verbal dictation system, charting was proofread for errors, errors may occur which could change the meaning of the information charted.   Final Clinical Impressions(s) / UC Diagnoses   Final diagnoses:  Positive pregnancy test  Nausea and vomiting, unspecified vomiting type     Discharge Instructions      You may start taking vitamin B6 2 times daily, in the morning and in the evening to help with nausea and vomiting.   I have attached information for a GYN doctor to your discharge paperwork, please call and schedule an appointment as soon as possible.      ED Prescriptions     Medication Sig Dispense Auth. Provider   pyridOXINE (VITAMIN B6) 50 MG tablet Take 1 tablet (50 mg total) by mouth in the morning and at bedtime. 45 tablet Flossie Dibble, NP      PDMP not reviewed this encounter.   Flossie Dibble, NP 07/11/22 2313

## 2022-07-24 ENCOUNTER — Other Ambulatory Visit (HOSPITAL_COMMUNITY)
Admission: RE | Admit: 2022-07-24 | Discharge: 2022-07-24 | Disposition: A | Discharge status: Home / Self Care | Payer: Medicaid Other | Source: Ambulatory Visit | Attending: Obstetrics & Gynecology | Admitting: Obstetrics & Gynecology

## 2022-07-24 ENCOUNTER — Ambulatory Visit (INDEPENDENT_AMBULATORY_CARE_PROVIDER_SITE_OTHER): Payer: Medicaid Other

## 2022-07-24 VITALS — BP 127/80 | HR 74 | Ht 69.0 in | Wt 263.0 lb

## 2022-07-24 DIAGNOSIS — Z3481 Encounter for supervision of other normal pregnancy, first trimester: Secondary | ICD-10-CM

## 2022-07-24 DIAGNOSIS — Z3480 Encounter for supervision of other normal pregnancy, unspecified trimester: Secondary | ICD-10-CM

## 2022-07-24 DIAGNOSIS — Z3A01 Less than 8 weeks gestation of pregnancy: Secondary | ICD-10-CM

## 2022-07-24 DIAGNOSIS — O09899 Supervision of other high risk pregnancies, unspecified trimester: Secondary | ICD-10-CM | POA: Insufficient documentation

## 2022-07-24 DIAGNOSIS — O3680X0 Pregnancy with inconclusive fetal viability, not applicable or unspecified: Secondary | ICD-10-CM

## 2022-07-24 DIAGNOSIS — R768 Other specified abnormal immunological findings in serum: Secondary | ICD-10-CM

## 2022-07-24 MED ORDER — BLOOD PRESSURE KIT DEVI: 1.0000 | 0 refills | Status: DC

## 2022-07-24 MED ORDER — GOJJI WEIGHT SCALE MISC: 1.0000 | 0 refills | Status: DC

## 2022-07-24 NOTE — Progress Notes (Signed)
New OB Intake  I connected with Megan Valenzuela  on 07/24/22 at  1:10 PM EST by in person and verified that I am speaking with the correct person using two identifiers. Nurse is located at [facility] and pt is located at [place].  I discussed the limitations, risks, security and privacy concerns of performing an evaluation and management service by telephone and the availability of in person appointments. I also discussed with the patient that there may be a patient responsible charge related to this service. The patient expressed understanding and agreed to proceed.  I explained I am completing New OB Intake today. We discussed EDD of 03/20/23 that is based on early first trimester u/s. Pt is G2/P1001. I reviewed her allergies, medications, Medical/Surgical/OB history, and appropriate screenings. I informed her of [place] services. [facility] information placed in AVS. Based on history, this is a low risk pregnancy.  There are no problems to display for this patient.   Concerns addressed today  Delivery Plans Plans to deliver at [facility]. Patient given information for [facility] Healthy Baby website for more information about Women's and [place]. Patient is not interested in water birth. Offered upcoming OB visit with CNM to discuss further.  MyChart/Babyscripts MyChart access verified. I explained pt will have some visits in office and some virtually. Babyscripts instructions given and order placed. Patient verifies receipt of registration text/e-mail. Account successfully created and app downloaded.  Blood Pressure Cuff/Weight Scale Blood pressure cuff ordered for patient to pick-up from [employer]. Explained after first prenatal appt pt will check weekly and document in 67. Patient does not have weight scale; order sent to [place], patient may track weight weekly in Babyscripts.  Anatomy US Explained first scheduled Korea will be around 19 weeks. Dating and viability u/s performed  today.   Labs Discussed Johnsie Cancel genetic screening with patient. Would like both Panorama and Horizon drawn at new OB visit. Routine prenatal labs needed.  COVID Vaccine Patient has not had COVID vaccine.   Is patient a CenteringPregnancy candidate?  Declined Declined due to Group setting Not a candidate due to  If accepted,   Social Determinants of Health Food Insecurity: Patient denies food insecurity. WIC Referral: Patient is interested in referral to [facility].  Transportation: Patient denies transportation needs. Childcare: Discussed no children allowed at ultrasound appointments. Offered childcare services; patient declines childcare services at this time.  First visit review I reviewed new OB appt with patient. I explained they will have a provider visit that includes pap smear, genetic screening, and discuss plan of care for pregnancy. Explained pt will be seen by Kerry Hough at first visit; encounter routed to appropriate provider. Explained that patient will be seen by pregnancy navigator following visit with provider.   Lucianne Lei, RN 07/24/2022  1:17 PM

## 2022-07-26 DIAGNOSIS — R768 Other specified abnormal immunological findings in serum: Secondary | ICD-10-CM | POA: Insufficient documentation

## 2022-07-26 LAB — CBC/D/PLT+RPR+RH+ABO+RUBIGG...
Antibody Screen: NEGATIVE
Basophils Absolute: 0 10*3/uL (ref 0.0–0.2)
Basos: 0 %
EOS (ABSOLUTE): 0.2 10*3/uL (ref 0.0–0.4)
Eos: 2 %
HCV Ab: REACTIVE — AB
HIV Screen 4th Generation wRfx: NONREACTIVE
Hematocrit: 42.2 % (ref 34.0–46.6)
Hemoglobin: 13.8 g/dL (ref 11.1–15.9)
Hepatitis B Surface Ag: NEGATIVE
Immature Grans (Abs): 0 10*3/uL (ref 0.0–0.1)
Immature Granulocytes: 0 %
Lymphocytes Absolute: 2.6 10*3/uL (ref 0.7–3.1)
Lymphs: 32 %
MCH: 29.2 pg (ref 26.6–33.0)
MCHC: 32.7 g/dL (ref 31.5–35.7)
MCV: 89 fL (ref 79–97)
Monocytes Absolute: 0.6 10*3/uL (ref 0.1–0.9)
Monocytes: 7 %
Neutrophils Absolute: 4.7 10*3/uL (ref 1.4–7.0)
Neutrophils: 59 %
Platelets: 197 10*3/uL (ref 150–450)
RBC: 4.73 x10E6/uL (ref 3.77–5.28)
RDW: 13.2 % (ref 11.7–15.4)
RPR Ser Ql: NONREACTIVE
Rh Factor: NEGATIVE
Rubella Antibodies, IGG: 1.66 index (ref >0.99)
WBC: 8.1 10*3/uL (ref 3.4–10.8)

## 2022-07-26 LAB — HCV RT-PCR, QUANT (NON-GRAPH): Hepatitis C Quantitation: NOT DETECTED IU/mL

## 2022-07-26 LAB — URINE CULTURE, OB REFLEX

## 2022-07-26 LAB — CERVICOVAGINAL ANCILLARY ONLY
Chlamydia: NEGATIVE
Comment: NEGATIVE
Comment: NORMAL
Neisseria Gonorrhea: NEGATIVE

## 2022-07-26 LAB — CULTURE, OB URINE

## 2022-08-14 ENCOUNTER — Ambulatory Visit (INDEPENDENT_AMBULATORY_CARE_PROVIDER_SITE_OTHER): Payer: Medicaid Other

## 2022-08-14 ENCOUNTER — Ambulatory Visit: Payer: Medicaid Other | Admitting: *Deleted

## 2022-08-14 VITALS — BP 123/82 | HR 81 | Ht 69.0 in | Wt 267.5 lb

## 2022-08-14 DIAGNOSIS — O3680X0 Pregnancy with inconclusive fetal viability, not applicable or unspecified: Secondary | ICD-10-CM | POA: Diagnosis not present

## 2022-08-14 DIAGNOSIS — Z3480 Encounter for supervision of other normal pregnancy, unspecified trimester: Secondary | ICD-10-CM

## 2022-08-14 DIAGNOSIS — Z3A08 8 weeks gestation of pregnancy: Secondary | ICD-10-CM

## 2022-08-14 NOTE — Progress Notes (Signed)
Presents for repeat US for viability and dating due to technically limited scan on 07/24/22. Scan completed. Viability confirmed. Dating confirmed. EDD 03/20/23.

## 2022-08-26 ENCOUNTER — Telehealth: Payer: Self-pay | Admitting: Emergency Medicine

## 2022-08-26 NOTE — Telephone Encounter (Signed)
Incoming call from pt to discuss Korea results. Informed of Hep C results

## 2022-09-02 ENCOUNTER — Encounter: Payer: Medicaid Other | Admitting: Medical

## 2022-09-05 ENCOUNTER — Encounter: Payer: Self-pay | Admitting: Obstetrics

## 2022-09-05 ENCOUNTER — Ambulatory Visit (INDEPENDENT_AMBULATORY_CARE_PROVIDER_SITE_OTHER): Payer: Medicaid Other

## 2022-09-05 ENCOUNTER — Ambulatory Visit (INDEPENDENT_AMBULATORY_CARE_PROVIDER_SITE_OTHER): Payer: Medicaid Other | Admitting: Obstetrics

## 2022-09-05 ENCOUNTER — Other Ambulatory Visit (HOSPITAL_COMMUNITY)
Admission: RE | Admit: 2022-09-05 | Discharge: 2022-09-05 | Disposition: A | Discharge status: Home / Self Care | Payer: Medicaid Other | Source: Ambulatory Visit | Attending: Medical | Admitting: Medical

## 2022-09-05 VITALS — BP 129/83 | HR 75 | Wt 265.0 lb

## 2022-09-05 DIAGNOSIS — O0990 Supervision of high risk pregnancy, unspecified, unspecified trimester: Secondary | ICD-10-CM | POA: Insufficient documentation

## 2022-09-05 DIAGNOSIS — O99211 Obesity complicating pregnancy, first trimester: Secondary | ICD-10-CM | POA: Diagnosis not present

## 2022-09-05 DIAGNOSIS — O98411 Viral hepatitis complicating pregnancy, first trimester: Secondary | ICD-10-CM | POA: Diagnosis not present

## 2022-09-05 DIAGNOSIS — Z3A12 12 weeks gestation of pregnancy: Secondary | ICD-10-CM

## 2022-09-05 DIAGNOSIS — R768 Other specified abnormal immunological findings in serum: Secondary | ICD-10-CM

## 2022-09-05 DIAGNOSIS — O09521 Supervision of elderly multigravida, first trimester: Secondary | ICD-10-CM | POA: Diagnosis not present

## 2022-09-05 DIAGNOSIS — E668 Other obesity: Secondary | ICD-10-CM

## 2022-09-05 DIAGNOSIS — N760 Acute vaginitis: Secondary | ICD-10-CM

## 2022-09-05 MED ORDER — CLINDAMYCIN HCL 300 MG PO CAPS: 300.0000 mg | ORAL_CAPSULE | Freq: Three times a day (TID) | ORAL | 0 refills | Status: DC

## 2022-09-05 NOTE — Progress Notes (Signed)
Patient presents for NOB. Due for PAP. Does not desire Genetic Screen.

## 2022-09-05 NOTE — Progress Notes (Addendum)
Subjective:    Megan Valenzuela is being seen today for her first obstetrical visit.  This is not a planned pregnancy. She is at 64w0dgestation. Her obstetrical history is significant for advanced maternal age and obesity. Relationship with FOB: significant other, not living together. Patient does intend to breast feed. Pregnancy history fully reviewed.  The information documented in the HPI was reviewed and verified.  Menstrual History: OB History     Gravida  2   Para  1   Term  1   Preterm      AB      Living  1      SAB      IAB      Ectopic      Multiple      Live Births  1            Patient's last menstrual period was 05/21/2022 (exact date).    Past Medical History:  Diagnosis Date   Asthma    Dental abscess     Past Surgical History:  Procedure Laterality Date   TYMPANOSTOMY TUBE PLACEMENT      (Not in a hospital admission)  Allergies  Allergen Reactions   Flagyl [Metronidazole] Other (See Comments)    headache   Keflex [Cephalexin] Other (See Comments)    Stomach Cramps Bruising   Latex Itching and Rash    Social History   Tobacco Use   Smoking status: Former    Types: Cigarettes   Smokeless tobacco: Never  Substance Use Topics   Alcohol use: No    Family History  Problem Relation Age of Onset   Diabetes Mother    Hypertension Mother    Cancer Maternal Grandmother      Review of Systems Constitutional: negative for weight loss Gastrointestinal: negative for vomiting Genitourinary: positive for malodorous vaginal discharge.  negative for genital lesions and dysuria Musculoskeletal:negative for back pain Behavioral/Psych: negative for abusive relationship, depression, illegal drug usage and tobacco use    Objective:    BP 129/83   Pulse 75   Wt 265 lb (120.2 kg)   LMP 05/21/2022 (Exact Date)   BMI 39.13 kg/m  General Appearance:    Alert, cooperative, no distress, appears stated age  Head:    Normocephalic,  without obvious abnormality, atraumatic  Eyes:    PERRL, conjunctiva/corneas clear, EOM's intact, fundi    benign, both eyes  Ears:    Normal TM's and external ear canals, both ears  Nose:   Nares normal, septum midline, mucosa normal, no drainage    or sinus tenderness  Throat:   Lips, mucosa, and tongue normal; teeth and gums normal  Neck:   Supple, symmetrical, trachea midline, no adenopathy;    thyroid:  no enlargement/tenderness/nodules; no carotid   bruit or JVD  Back:     Symmetric, no curvature, ROM normal, no CVA tenderness  Lungs:     Clear to auscultation bilaterally, respirations unlabored  Chest Wall:    No tenderness or deformity   Heart:    Regular rate and rhythm, S1 and S2 normal, no murmur, rub   or gallop  Breast Exam:    No tenderness, masses, or nipple abnormality  Abdomen:     Soft, non-tender, bowel sounds active all four quadrants,    no masses, no organomegaly  Genitalia:    Normal female without lesion, discharge or tenderness  Extremities:   Extremities normal, atraumatic, no cyanosis or edema  Pulses:   2+  and symmetric all extremities  Skin:   Skin color, texture, turgor normal, no rashes or lesions  Lymph nodes:   Cervical, supraclavicular, and axillary nodes normal  Neurologic:   CNII-XII intact, normal strength, sensation and reflexes    throughout      Lab Review Urine pregnancy test Labs reviewed yes Radiologic studies reviewed yes  Assessment:    Pregnancy at 19w0dweeks    Plan:    1. Supervision of high risk pregnancy, antepartum Rx: - Cytology - PAP( [place]) - Cervicovaginal ancillary only( [place]) - UK[place]OB Limited; Future  2. AMA (advanced maternal age) multigravida 317+ first trimester  3. Hepatitis C antibody test positive - reflex Hepatitis C Quantitation not detected    4. Other obesity affecting pregnancy, antepartum  5. BV Rx: Clindamycin 300 mg po tid, # 21, no refills    Prenatal vitamins.  Counseling  provided regarding continued use of seat belts, cessation of alcohol consumption, smoking or use of illicit drugs; infection precautions i.e., influenza/TDAP immunizations, toxoplasmosis,CMV, parvovirus, listeria and varicella; workplace safety, exercise during pregnancy; routine dental care, safe medications, sexual activity, hot tubs, saunas, pools, travel, caffeine use, fish and methlymercury, potential toxins, hair treatments, varicose veins Weight gain recommendations per IOM guidelines reviewed: underweight/BMI< 18.5--> gain 28 - 40 lbs; normal weight/BMI 18.5 - 24.9--> gain 25 - 35 lbs; overweight/BMI 25 - 29.9--> gain 15 - 25 lbs; obese/BMI >30->gain  11 - 20 lbs Problem list reviewed and updated. FIRST/CF mutation testing/NIPT/QUAD SCREEN/fragile X/Ashkenazi Jewish population testing/Spinal muscular atrophy discussed: declined. Role of ultrasound in pregnancy discussed; fetal survey: requested. Amniocentesis discussed: not indicated.   Orders Placed This Encounter  Procedures   UKoreaOB Limited    Standing Status:   Future    Number of Occurrences:   1    Standing Expiration Date:   09/05/2023    Order Specific Question:   Reason for Exam (SYMPTOM  OR DIAGNOSIS REQUIRED)    Answer:   viability and dating scan    Order Specific Question:   Preferred Imaging Location?    Answer:   External    Follow up in 4 weeks.  I have spent a total of 20 minutes of face-to-face time, excluding clinical staff time, reviewing notes and preparing to see patient, ordering tests and/or medications, and counseling the patient.   HShelly Bombard MD 09/05/2022 11:17 AM

## 2022-09-05 NOTE — Addendum Note (Signed)
Addended by: Shelly Bombard on: 09/05/2022 03:35 PM   Modules accepted: Orders

## 2022-09-09 ENCOUNTER — Other Ambulatory Visit: Payer: Self-pay | Admitting: Obstetrics

## 2022-09-09 DIAGNOSIS — B3731 Acute candidiasis of vulva and vagina: Secondary | ICD-10-CM

## 2022-09-09 LAB — CERVICOVAGINAL ANCILLARY ONLY
Candida Glabrata: NEGATIVE
Candida Vaginitis: POSITIVE — AB
Chlamydia: NEGATIVE
Comment: NEGATIVE
Comment: NEGATIVE
Comment: NEGATIVE
Comment: NEGATIVE
Comment: NORMAL
Neisseria Gonorrhea: NEGATIVE
Trichomonas: NEGATIVE

## 2022-09-09 MED ORDER — TERCONAZOLE 0.8 % VA CREA: 1.0000 | TOPICAL_CREAM | Freq: Every day | VAGINAL | 0 refills | Status: DC

## 2022-09-10 LAB — CYTOLOGY - PAP
Comment: NEGATIVE
Diagnosis: NEGATIVE
High risk HPV: NEGATIVE

## 2022-09-11 ENCOUNTER — Other Ambulatory Visit: Payer: Self-pay | Admitting: Obstetrics

## 2022-09-16 ENCOUNTER — Telehealth: Payer: Self-pay | Admitting: *Deleted

## 2022-09-16 ENCOUNTER — Other Ambulatory Visit: Payer: Self-pay | Admitting: *Deleted

## 2022-09-16 NOTE — Telephone Encounter (Signed)
TC from pt asking about details of next prenatal visit. Advised visit is a routine OB appt. Advised of typical schedule of prenatal visits. Advised AFP test would likely be offered at that visit. Pt requesting Korea. Advised that Korea will be scheduled around 19 weeks for anatomy scan. Pt expressed concerns about "the sugar test". Advised to discuss with provider at next visit. Early glucose test is not scheduled at this time.

## 2022-09-25 ENCOUNTER — Other Ambulatory Visit: Payer: Self-pay | Admitting: Emergency Medicine

## 2022-09-25 MED ORDER — TERCONAZOLE 0.8 % VA CREA: 1.0000 | TOPICAL_CREAM | Freq: Every day | VAGINAL | 0 refills | Status: DC

## 2022-10-02 ENCOUNTER — Other Ambulatory Visit: Payer: Self-pay

## 2022-10-02 ENCOUNTER — Telehealth: Payer: Self-pay

## 2022-10-02 DIAGNOSIS — B3731 Acute candidiasis of vulva and vagina: Secondary | ICD-10-CM

## 2022-10-02 MED ORDER — TERCONAZOLE 0.8 % VA CREA: 1.0000 | TOPICAL_CREAM | Freq: Every day | VAGINAL | 0 refills | Status: DC

## 2022-10-02 NOTE — Telephone Encounter (Signed)
Called patient to inform that terconazole cream is not covered by insurance. Patient states that she will purchase otc treatment

## 2022-10-03 ENCOUNTER — Encounter: Payer: Self-pay | Admitting: Obstetrics and Gynecology

## 2022-10-03 ENCOUNTER — Ambulatory Visit (INDEPENDENT_AMBULATORY_CARE_PROVIDER_SITE_OTHER): Payer: Medicaid Other | Admitting: Obstetrics and Gynecology

## 2022-10-03 VITALS — BP 129/82 | HR 96 | Wt 269.0 lb

## 2022-10-03 DIAGNOSIS — O26899 Other specified pregnancy related conditions, unspecified trimester: Secondary | ICD-10-CM

## 2022-10-03 DIAGNOSIS — R768 Other specified abnormal immunological findings in serum: Secondary | ICD-10-CM

## 2022-10-03 DIAGNOSIS — O09529 Supervision of elderly multigravida, unspecified trimester: Secondary | ICD-10-CM | POA: Insufficient documentation

## 2022-10-03 DIAGNOSIS — Z3A16 16 weeks gestation of pregnancy: Secondary | ICD-10-CM

## 2022-10-03 DIAGNOSIS — Z6791 Unspecified blood type, Rh negative: Secondary | ICD-10-CM

## 2022-10-03 DIAGNOSIS — O26892 Other specified pregnancy related conditions, second trimester: Secondary | ICD-10-CM

## 2022-10-03 DIAGNOSIS — O09522 Supervision of elderly multigravida, second trimester: Secondary | ICD-10-CM

## 2022-10-03 DIAGNOSIS — Z3480 Encounter for supervision of other normal pregnancy, unspecified trimester: Secondary | ICD-10-CM

## 2022-10-03 NOTE — Progress Notes (Signed)
Subjective:  Megan Valenzuela is a 42 y.o. G2P1001 at [redacted]w[redacted]d being seen today for ongoing prenatal care.  She is currently monitored for the following issues for this high-risk pregnancy and has Supervision of other normal pregnancy, antepartum; Hepatitis C antibody test positive; Rh negative state in antepartum period; and AMA (advanced maternal age) multigravida 35+ on their problem list.  Patient reports  constipation .  Contractions: Not present. Vag. Bleeding: None.  Movement: Present. Denies leaking of fluid.   The following portions of the patient's history were reviewed and updated as appropriate: allergies, current medications, past family history, past medical history, past social history, past surgical history and problem list. Problem list updated.  Objective:   Vitals:   10/03/22 1004  BP: 129/82  Pulse: 96  Weight: 269 lb (122 kg)    Fetal Status: Fetal Heart Rate (bpm): 152   Movement: Present     General:  Alert, oriented and cooperative. Patient is in no acute distress.  Skin: Skin is warm and dry. No rash noted.   Cardiovascular: Normal heart rate noted  Respiratory: Normal respiratory effort, no problems with respiration noted  Abdomen: Soft, gravid, appropriate for gestational age. Pain/Pressure: Absent     Pelvic:  Cervical exam deferred        Extremities: Normal range of motion.  Edema: None  Mental Status: Normal mood and affect. Normal behavior. Normal judgment and thought content.   Urinalysis:      Assessment and Plan:  Pregnancy: G2P1001 at [redacted]w[redacted]d  1. Supervision of other normal pregnancy, antepartum Stable Increase water intake and stool softener   2. Hepatitis C antibody test positive Check viral load with Glucola Declined blood draw today  3. Rh negative state in antepartum period Rho as indicated  4. Multigravida of advanced maternal age in second trimester Declines genetic testing  Preterm labor symptoms and general obstetric  precautions including but not limited to vaginal bleeding, contractions, leaking of fluid and fetal movement were reviewed in detail with the patient. Please refer to After Visit Summary for other counseling recommendations.  Return in about 4 weeks (around 10/31/2022) for OB visit, face to face, any provider.   Chancy Milroy, MD

## 2022-10-03 NOTE — Progress Notes (Signed)
ROB, Pt declined AFP

## 2022-10-18 DIAGNOSIS — O99210 Obesity complicating pregnancy, unspecified trimester: Secondary | ICD-10-CM | POA: Insufficient documentation

## 2022-10-24 ENCOUNTER — Ambulatory Visit: Payer: Medicaid Other | Admitting: *Deleted

## 2022-10-24 ENCOUNTER — Encounter: Payer: Self-pay | Admitting: *Deleted

## 2022-10-24 ENCOUNTER — Ambulatory Visit: Payer: Medicaid Other | Attending: Obstetrics and Gynecology

## 2022-10-24 ENCOUNTER — Other Ambulatory Visit: Payer: Self-pay | Admitting: *Deleted

## 2022-10-24 VITALS — BP 132/73 | HR 81

## 2022-10-24 DIAGNOSIS — O09522 Supervision of elderly multigravida, second trimester: Secondary | ICD-10-CM

## 2022-10-24 DIAGNOSIS — O99212 Obesity complicating pregnancy, second trimester: Secondary | ICD-10-CM | POA: Diagnosis present

## 2022-10-24 DIAGNOSIS — Z3480 Encounter for supervision of other normal pregnancy, unspecified trimester: Secondary | ICD-10-CM | POA: Diagnosis not present

## 2022-10-24 DIAGNOSIS — Z3A19 19 weeks gestation of pregnancy: Secondary | ICD-10-CM | POA: Insufficient documentation

## 2022-10-24 DIAGNOSIS — O98412 Viral hepatitis complicating pregnancy, second trimester: Secondary | ICD-10-CM | POA: Diagnosis not present

## 2022-10-24 DIAGNOSIS — B182 Chronic viral hepatitis C: Secondary | ICD-10-CM | POA: Diagnosis not present

## 2022-10-24 DIAGNOSIS — B1920 Unspecified viral hepatitis C without hepatic coma: Secondary | ICD-10-CM

## 2022-10-24 DIAGNOSIS — Z363 Encounter for antenatal screening for malformations: Secondary | ICD-10-CM | POA: Diagnosis not present

## 2022-10-31 ENCOUNTER — Ambulatory Visit (INDEPENDENT_AMBULATORY_CARE_PROVIDER_SITE_OTHER): Payer: Medicaid Other | Admitting: Obstetrics & Gynecology

## 2022-10-31 VITALS — BP 116/79 | HR 79 | Wt 275.2 lb

## 2022-10-31 DIAGNOSIS — R768 Other specified abnormal immunological findings in serum: Secondary | ICD-10-CM

## 2022-10-31 DIAGNOSIS — Z6791 Unspecified blood type, Rh negative: Secondary | ICD-10-CM

## 2022-10-31 DIAGNOSIS — Z3480 Encounter for supervision of other normal pregnancy, unspecified trimester: Secondary | ICD-10-CM

## 2022-10-31 DIAGNOSIS — O26899 Other specified pregnancy related conditions, unspecified trimester: Secondary | ICD-10-CM

## 2022-10-31 DIAGNOSIS — O09522 Supervision of elderly multigravida, second trimester: Secondary | ICD-10-CM

## 2022-10-31 NOTE — Progress Notes (Signed)
Patient presents for ROB and AFP. No concerns today.

## 2022-10-31 NOTE — Progress Notes (Signed)
   PRENATAL VISIT NOTE  Subjective:  Megan Valenzuela is a 42 y.o. G2P1001 at [redacted]w[redacted]d being seen today for oFallyn [place]ner[place].  She is currently monitored for the following issues for this high-risk pregnancy and has Supervision of other normal pregnancy, antepartum; Hepatitis C antibody test positive; Rh negative state in antepartum period; AMA (advanced maternal age) multigravida 35+; and Obesity affecting pregnancy on their problem list.  Patient reports no complaints.  Contractions: Not present. Vag. Bleeding: None.  Movement: Present. Denies leaking of fluid.   The following portions of the patient's history were reviewed and updated as appropriate: allergies, current medications, past family history, past medical history, past social history, past surgical history and problem list.   Objective:   Vitals:   10/31/22 1015  BP: 116/79  Pulse: 79  Weight: 275 lb 3.2 oz (124.8 kg)    Fetal Status: Fetal Heart Rate (bpm): 145   Movement: Present     General:  Alert, oriented and cooperative. Patient is in no acute distress.  Skin: Skin is warm and dry. No rash noted.   Cardiovascular: Normal heart rate noted  Respiratory: Normal respiratory effort, no problems with respiration noted  Abdomen: Soft, gravid, appropriate for gestational age.  Pain/Pressure: Absent     Pelvic: Cervical exam deferred        Extremities: Normal range of motion.  Edema: Trace  Mental Status: Normal mood and affect. Normal behavior. Normal judgment and thought content.   Assessment and Plan:  Pregnancy: G2P1001 at [redacted]w[redacted]d 1. Supervision of other normal pregnancy, antepartum Genetic testing - Panorama Prenatal Test Full Panel - AFP, Serum, Open Spina Bifida  2. Multigravida of advanced maternal age in second trimester [redacted]w[redacted]d   3. Hepatitis C antibody test positive Will retest for VL in 8 weeks  4. Rh negative state in antepartum period Rhogam 28 weeks  Preterm labor symptoms and general  obstetric precautions including but not limited to vaginal bleeding, contractions, leaking of fluid and fetal movement were reviewed in detail with the patient. Please refer to After Visit Summary for other counseling recommendations.   RTC 4 weeks  Future Appointments  Date Time Provider Department Center  11/26/2022  7:15 AM [facility] NURSE [facility]  11/26/2022  7:30 AM [facility] US2 [facility]    Scheryl Darter, MD

## 2022-11-02 LAB — AFP, SERUM, OPEN SPINA BIFIDA
AFP MoM: 1.47
AFP Value: 59.9 ng/mL
Gest. Age on Collection Date: 20 weeks
Maternal Age At EDD: 40.3 yr
OSBR Risk 1 IN: 2965
Test Results:: NEGATIVE
Weight: 275 [lb_av]

## 2022-11-09 LAB — PANORAMA PRENATAL TEST FULL PANEL:PANORAMA TEST PLUS 5 ADDITIONAL MICRODELETIONS: FETAL FRACTION: 5.9

## 2022-11-26 ENCOUNTER — Ambulatory Visit: Payer: Medicaid Other

## 2022-11-28 ENCOUNTER — Ambulatory Visit (INDEPENDENT_AMBULATORY_CARE_PROVIDER_SITE_OTHER): Payer: Medicaid Other

## 2022-11-28 ENCOUNTER — Other Ambulatory Visit (HOSPITAL_COMMUNITY)
Admission: RE | Admit: 2022-11-28 | Discharge: 2022-11-28 | Disposition: A | Discharge status: Home / Self Care | Payer: Medicaid Other | Source: Ambulatory Visit | Attending: Obstetrics and Gynecology | Admitting: Obstetrics and Gynecology

## 2022-11-28 ENCOUNTER — Encounter: Payer: Medicaid Other | Admitting: Obstetrics and Gynecology

## 2022-11-28 VITALS — BP 119/81 | HR 80 | Wt 277.0 lb

## 2022-11-28 DIAGNOSIS — Z3A24 24 weeks gestation of pregnancy: Secondary | ICD-10-CM

## 2022-11-28 DIAGNOSIS — O0990 Supervision of high risk pregnancy, unspecified, unspecified trimester: Secondary | ICD-10-CM | POA: Insufficient documentation

## 2022-11-28 DIAGNOSIS — O99210 Obesity complicating pregnancy, unspecified trimester: Secondary | ICD-10-CM

## 2022-11-28 DIAGNOSIS — E668 Other obesity: Secondary | ICD-10-CM

## 2022-11-28 DIAGNOSIS — N898 Other specified noninflammatory disorders of vagina: Secondary | ICD-10-CM

## 2022-11-28 DIAGNOSIS — O26892 Other specified pregnancy related conditions, second trimester: Secondary | ICD-10-CM

## 2022-11-28 DIAGNOSIS — O09522 Supervision of elderly multigravida, second trimester: Secondary | ICD-10-CM

## 2022-11-28 NOTE — Progress Notes (Signed)
Pt presents for ROB visit. Pt c/o vaginal discharge. No other concerns.

## 2022-11-28 NOTE — Patient Instructions (Addendum)
  [place] [facility] Providers          [facility]  [address], [place], [place] [address]  [phone]  [facility]  [address], [place], [place] [address]  [phone]  [facility]  [address], [place], [place] [address]  [phone]  [facility] [address], [place], [place] [address] [phone]  [facility] [place]  [address], [place], [place] [address]  [phone]  [facility]  [address]First floor), [place], [place] [address]  [phone]  [facility]  [address], [place], [place] [address]  [phone]  [facility]  [address], [place], [place] [address]  [phone]  [facility]  [address], [place], [place] [address]  [phone]  [facility]  [address], [place], [place] [address]  [phone]  [facility]  [address], [place], [place] [address]  [phone]  [facility]  [address], [place], [place] [address]  [phone]  [facility]   [address], [place], [place] [address]  [phone]  [facility]  [address], [place], [place] [address]   [phone]  [facility] [address], [place], [place] [address] [phone]  [facility]  [address], [place], [place] [address]  [phone]         

## 2022-11-28 NOTE — Progress Notes (Signed)
HIGH-RISK PREGNANCY OFFICE VISIT  Patient name: Megan Valenzuela MRN [id number]  Date of birth: [birth year] Chief Complaint:   Routine Prenatal Visit  Subjective:   Megan Valenzuela is a 42 y.o. G35P1001 female at [redacted]w[redacted]d with an Estimated Date of Delivery: 03/20/23 being seen today for ongoing management of a high-risk pregnancy aeb has Supervision of other normal pregnancy, antepartum; Hepatitis C antibody test positive; Rh negative state in antepartum period; AMA (advanced maternal age) multigravida 35+; and Obesity affecting pregnancy on their problem list.  Patient presents today, alone, with vaginal irritation.  Patient reports that she started having "milky, pasty" discharge with "fishy" odor about 2 days ago.  She states she started having itching yesterday.  Patient endorses fetal movement. Patient denies abdominal cramping or contractions.  Patient denies other vaginal concerns including leaking of fluid and bleeding. No issues with urination, constipation, or diarrhea.    Contractions: Not present. Vag. Bleeding: None.  Movement: Present.  Reviewed past medical,surgical, social, obstetrical and family history as well as problem list, medications and allergies.  Objective   Vitals:   11/28/22 1004  BP: 119/81  Pulse: 80  Weight: 277 lb (125.6 kg)  Body mass index is 40.91 kg/m.  Total Weight Gain:27 lb (12.2 kg)         Physical Examination:   General appearance: Well appearing, and in no distress  Mental status: Alert, oriented to person, place, and time  Skin: Warm & dry  Cardiovascular: Normal heart rate noted  Respiratory: Normal respiratory effort, no distress  Abdomen: Soft, gravid, nontender, LGA with Fundal Height: 33 cm  Pelvic: Cervical exam deferred           Extremities: Edema: Trace  Fetal Status: Fetal Heart Rate (bpm): 143  Movement: Present   No results found for this or any previous visit (from the past 24 hour(s)).  Assessment & Plan:   High-risk pregnancy of a 42 y.o., G2P1001 at [redacted]w[redacted]d with an Estimated Date of Delivery: 03/20/23   1. Supervision of high risk pregnancy, antepartum -Anticipatory guidance for upcoming appts. -Patient to schedule next appt in 3-4 weeks for an in-person visit. -Reviewed Glucola appt preparation including fasting the night before and morning of.   *Informed that it is okay to drink plain water throughout the night and prior to consumption of Glucola formula.  -Discussed anticipated office time of 2.5-3 hours.  -Reviewed blood draw procedures and labs which also include check of iron/HgB level, RPR, and HIV *Informed that repeat RPR/HIV are for pediatric records/compliance.  -Discussed how results of GTT are handled including diabetic education and BS testing for abnormal results and routine care for normal results.    2. Multigravida of advanced maternal age in second trimester -Discussed antenatal testing starting at 36 weeks. -Scheduled for growth Korea next week.  Will also have growth around 32 and 36 weeks. -Reviewed recommendation for IOL between 39-40 weeks less any additional complications.  3. Other obesity affecting pregnancy, antepartum -BMI 40 today. -TWG 27lbs -Not taking bASA  4. Vaginal discharge during pregnancy in second trimester -CV collected. -Will treat accordingly.      Meds: No orders of the defined types were placed in this encounter.  Labs/procedures today:  Lab Orders  No laboratory test(s) ordered today     Reviewed: Preterm labor symptoms and general obstetric precautions including but not limited to vaginal bleeding, contractions, leaking of fluid and fetal movement were reviewed in detail with the patient.  All questions were answered.  Follow-up: Return in about 4 weeks (around 12/26/2022) for HROB with GBS.  No orders of the defined types were placed in this encounter.  Cherre Robins MSN, CNM 11/28/2022 .Lorin Picket

## 2022-11-29 ENCOUNTER — Telehealth: Payer: Self-pay

## 2022-11-29 LAB — CERVICOVAGINAL ANCILLARY ONLY
Bacterial Vaginitis (gardnerella): NEGATIVE
Candida Glabrata: NEGATIVE
Candida Vaginitis: POSITIVE — AB
Comment: NEGATIVE
Comment: NEGATIVE
Comment: NEGATIVE

## 2022-11-29 NOTE — Telephone Encounter (Signed)
Returned call and answered questions about vaginal swab

## 2022-11-30 MED ORDER — TERCONAZOLE 0.8 % VA CREA: 1.0000 | TOPICAL_CREAM | Freq: Every day | VAGINAL | 0 refills | Status: DC

## 2022-11-30 NOTE — Addendum Note (Signed)
Addended by: Gerrit Heck L on: 11/30/2022 03:13 AM   Modules accepted: Orders

## 2022-12-06 ENCOUNTER — Other Ambulatory Visit: Payer: Self-pay | Admitting: *Deleted

## 2022-12-06 ENCOUNTER — Ambulatory Visit: Payer: Medicaid Other | Admitting: *Deleted

## 2022-12-06 ENCOUNTER — Ambulatory Visit: Payer: Medicaid Other | Attending: Obstetrics

## 2022-12-06 VITALS — BP 123/62 | HR 82

## 2022-12-06 DIAGNOSIS — O360120 Maternal care for anti-D [Rh] antibodies, second trimester, not applicable or unspecified: Secondary | ICD-10-CM | POA: Diagnosis not present

## 2022-12-06 DIAGNOSIS — B1920 Unspecified viral hepatitis C without hepatic coma: Secondary | ICD-10-CM | POA: Diagnosis present

## 2022-12-06 DIAGNOSIS — B182 Chronic viral hepatitis C: Secondary | ICD-10-CM | POA: Diagnosis not present

## 2022-12-06 DIAGNOSIS — O98412 Viral hepatitis complicating pregnancy, second trimester: Secondary | ICD-10-CM

## 2022-12-06 DIAGNOSIS — E669 Obesity, unspecified: Secondary | ICD-10-CM

## 2022-12-06 DIAGNOSIS — O09522 Supervision of elderly multigravida, second trimester: Secondary | ICD-10-CM

## 2022-12-06 DIAGNOSIS — Z3A25 25 weeks gestation of pregnancy: Secondary | ICD-10-CM

## 2022-12-06 DIAGNOSIS — O99210 Obesity complicating pregnancy, unspecified trimester: Secondary | ICD-10-CM | POA: Diagnosis present

## 2022-12-06 DIAGNOSIS — O99212 Obesity complicating pregnancy, second trimester: Secondary | ICD-10-CM

## 2022-12-06 DIAGNOSIS — O26892 Other specified pregnancy related conditions, second trimester: Secondary | ICD-10-CM

## 2022-12-25 ENCOUNTER — Ambulatory Visit (INDEPENDENT_AMBULATORY_CARE_PROVIDER_SITE_OTHER): Payer: Medicaid Other | Admitting: Obstetrics and Gynecology

## 2022-12-25 ENCOUNTER — Other Ambulatory Visit: Payer: Medicaid Other

## 2022-12-25 VITALS — BP 117/75 | HR 97 | Wt 283.0 lb

## 2022-12-25 DIAGNOSIS — R7689 Other specified abnormal immunological findings in serum: Secondary | ICD-10-CM

## 2022-12-25 DIAGNOSIS — Z3A27 27 weeks gestation of pregnancy: Secondary | ICD-10-CM | POA: Diagnosis not present

## 2022-12-25 DIAGNOSIS — O360920 Maternal care for other rhesus isoimmunization, second trimester, not applicable or unspecified: Secondary | ICD-10-CM | POA: Diagnosis not present

## 2022-12-25 DIAGNOSIS — R768 Other specified abnormal immunological findings in serum: Secondary | ICD-10-CM

## 2022-12-25 DIAGNOSIS — Z23 Encounter for immunization: Secondary | ICD-10-CM | POA: Diagnosis not present

## 2022-12-25 DIAGNOSIS — O26899 Other specified pregnancy related conditions, unspecified trimester: Secondary | ICD-10-CM

## 2022-12-25 DIAGNOSIS — Z6791 Unspecified blood type, Rh negative: Secondary | ICD-10-CM

## 2022-12-25 DIAGNOSIS — O09522 Supervision of elderly multigravida, second trimester: Secondary | ICD-10-CM

## 2022-12-25 DIAGNOSIS — Z3480 Encounter for supervision of other normal pregnancy, unspecified trimester: Secondary | ICD-10-CM

## 2022-12-25 NOTE — Progress Notes (Signed)
   PRENATAL VISIT NOTE  Subjective:  Megan Valenzuela is a 42 y.o. G2P1001 at [redacted]w[redacted]d being seen today for ongoing prenatal care.  She is currently monitored for the following issues for this high-risk pregnancy and has Supervision of other normal pregnancy, antepartum; Hepatitis C antibody test positive; Rh negative state in antepartum period; AMA (advanced maternal age) multigravida 35+; and Obesity affecting pregnancy on their problem list.  Patient reports  doing well overall. +round ligament pain .  Contractions: Not present. Vag. Bleeding: None.  Movement: Present. Denies leaking of fluid.   The following portions of the patient's history were reviewed and updated as appropriate: allergies, current medications, past family history, past medical history, past social history, past surgical history and problem list.   Objective:   Vitals:   12/25/22 0949  BP: 117/75  Pulse: 97  Weight: 283 lb (128.4 kg)    Fetal Status: Fetal Heart Rate (bpm): 145   Movement: Present     General:  Alert, oriented and cooperative. Patient is in no acute distress.  Skin: Skin is warm and dry. No rash noted.   Cardiovascular: Normal heart rate noted  Respiratory: Normal respiratory effort, no problems with respiration noted  Abdomen: Soft, gravid, appropriate for gestational age.  Pain/Pressure: Absent      Assessment and Plan:  Pregnancy: G2P1001 at [redacted]w[redacted]d 1. Supervision of other normal pregnancy, antepartum 2. [redacted] weeks gestation of pregnancy - Tdap today - Glucose Tolerance, 2 Hours w/1 Hour - CBC - HIV Antibody (routine testing w rflx) - RPR  3. Hepatitis C antibody test positive - HCV RT-PCR, Quant (Graph) - CMP  4. Rh negative state in antepartum period Rhogam today  5. Multigravida of advanced maternal age in second trimester Normal anatomy, LR NIPS Growth Korea scheduled 7/5 Discussed ldASA, pt declines  Please refer to After Visit Summary for other counseling recommendations.    Return in about 2 weeks (around 01/08/2023) for return OB at 30 weeks.  Future Appointments  Date Time Provider Department Center  01/08/2023  8:35 AM Lennart Pall, MD [facility] None  01/10/2023  8:30 AM [facility] NURSE [facility]  01/10/2023  8:45 AM [facility] US5 [facility]  01/22/2023  8:35 AM Mercado-Ortiz, Lahoma Crocker, DO [facility] None  02/05/2023  8:35 AM Lennart Pall, MD [facility] None  02/19/2023  8:35 AM Warden Fillers, MD [facility] None  02/26/2023  8:35 AM Hermina Staggers, MD [facility] None  03/05/2023  8:35 AM Hermina Staggers, MD [facility] None   Lennart Pall, MD

## 2022-12-27 LAB — COMPREHENSIVE METABOLIC PANEL
ALT: 15 IU/L (ref 0–32)
AST: 14 IU/L (ref 0–40)
Albumin: 3.8 g/dL — ABNORMAL LOW (ref 3.9–4.9)
Alkaline Phosphatase: 74 IU/L (ref 44–121)
BUN/Creatinine Ratio: 8 — ABNORMAL LOW (ref 9–23)
BUN: 6 mg/dL (ref 6–24)
Bilirubin Total: <0.2 mg/dL (ref 0.0–1.2)
CO2: 18 mmol/L — ABNORMAL LOW (ref 20–29)
Calcium: 9 mg/dL (ref 8.7–10.2)
Chloride: 104 mmol/L (ref 96–106)
Creatinine, Ser: 0.74 mg/dL (ref 0.57–1.00)
Globulin, Total: 2.8 g/dL (ref 1.5–4.5)
Glucose: 84 mg/dL (ref 70–99)
Potassium: 4 mmol/L (ref 3.5–5.2)
Sodium: 137 mmol/L (ref 134–144)
Total Protein: 6.6 g/dL (ref 6.0–8.5)
eGFR: 105 mL/min/{1.73_m2} (ref >59)

## 2022-12-27 LAB — CBC
Hematocrit: 32.7 % — ABNORMAL LOW (ref 34.0–46.6)
Hemoglobin: 10.9 g/dL — ABNORMAL LOW (ref 11.1–15.9)
MCH: 29.5 pg (ref 26.6–33.0)
MCHC: 33.3 g/dL (ref 31.5–35.7)
MCV: 89 fL (ref 79–97)
Platelets: 173 10*3/uL (ref 150–450)
RBC: 3.69 x10E6/uL — ABNORMAL LOW (ref 3.77–5.28)
RDW: 12.5 % (ref 11.7–15.4)
WBC: 8 10*3/uL (ref 3.4–10.8)

## 2022-12-27 LAB — GLUCOSE TOLERANCE, 2 HOURS W/ 1HR
Glucose, 1 hour: 108 mg/dL (ref 70–179)
Glucose, 2 hour: 89 mg/dL (ref 70–152)
Glucose, Fasting: 73 mg/dL (ref 70–91)

## 2022-12-27 LAB — HCV RT-PCR, QUANT (GRAPH): Hepatitis C Quantitation: NOT DETECTED IU/mL

## 2022-12-27 LAB — RPR: RPR Ser Ql: NONREACTIVE

## 2022-12-27 LAB — HIV ANTIBODY (ROUTINE TESTING W REFLEX): HIV Screen 4th Generation wRfx: NONREACTIVE

## 2023-01-08 ENCOUNTER — Ambulatory Visit (INDEPENDENT_AMBULATORY_CARE_PROVIDER_SITE_OTHER): Payer: Medicaid Other | Admitting: Obstetrics and Gynecology

## 2023-01-08 VITALS — BP 113/73 | HR 91 | Wt 285.0 lb

## 2023-01-08 DIAGNOSIS — Z3480 Encounter for supervision of other normal pregnancy, unspecified trimester: Secondary | ICD-10-CM

## 2023-01-08 DIAGNOSIS — Z3A29 29 weeks gestation of pregnancy: Secondary | ICD-10-CM

## 2023-01-08 DIAGNOSIS — O09523 Supervision of elderly multigravida, third trimester: Secondary | ICD-10-CM

## 2023-01-08 DIAGNOSIS — R768 Other specified abnormal immunological findings in serum: Secondary | ICD-10-CM

## 2023-01-08 DIAGNOSIS — Z6791 Unspecified blood type, Rh negative: Secondary | ICD-10-CM

## 2023-01-08 DIAGNOSIS — O26899 Other specified pregnancy related conditions, unspecified trimester: Secondary | ICD-10-CM

## 2023-01-08 NOTE — Progress Notes (Signed)
   PRENATAL VISIT NOTE  Subjective:  Megan Valenzuela is a 42 y.o. G2P1001 at 101w6d being seen today for ongoing prenatal care.  She is currently monitored for the following issues for this high-risk pregnancy and has Supervision of other normal pregnancy, antepartum; Hepatitis C antibody test positive; Rh negative state in antepartum period; AMA (advanced maternal age) multigravida 35+; and Obesity affecting pregnancy on their problem list.  Patient reports no complaints. Questions about CS/epidurals/induction  Contractions: Not present. Vag. Bleeding: None.  Movement: Present. Denies leaking of fluid.   The following portions of the patient's history were reviewed and updated as appropriate: allergies, current medications, past family history, past medical history, past social history, past surgical history and problem list.   Objective:   Vitals:   01/08/23 0842  BP: 113/73  Pulse: 91  Weight: 285 lb (129.3 kg)   Fetal Status: Fetal Heart Rate (bpm): 140   Movement: Present    FH deferred - growth 7/5 General:  Alert, oriented and cooperative. Patient is in no acute distress.  Skin: Skin is warm and dry. No rash noted.   Cardiovascular: Normal heart rate noted  Respiratory: Normal respiratory effort, no problems with respiration noted  Abdomen: Soft, gravid, appropriate for gestational age.  Pain/Pressure: Absent      Assessment and Plan:  Pregnancy: G2P1001 at [redacted]w[redacted]d 1. Supervision of other normal pregnancy, antepartum 2. [redacted] weeks gestation of pregnancy Discussed various indications for induction, CS, etc. Reviewed questions about epidural  3. Hepatitis C antibody test positive VL negative & normal LFTs 12/25/22   4. Rh negative state in antepartum period S/p rhogam 6/19  5. Multigravida of advanced maternal age in third trimester Normal anatomy, LR NIPS Growth scheduled 7/5 Previously declined ldASA  Preterm labor symptoms and general obstetric precautions including  but not limited to vaginal bleeding, contractions, leaking of fluid and fetal movement were reviewed in detail with the patient.  Please refer to After Visit Summary for other counseling recommendations.   Return in about 2 weeks (around 01/22/2023) for return OB at 31 weeks.  Future Appointments  Date Time Provider Department Center  01/10/2023  8:30 AM [facility] NURSE [facility]  01/10/2023  8:45 AM [facility] US5 [facility]  01/22/2023  8:35 AM Mercado-Ortiz, Lahoma Crocker, DO [facility] None  02/05/2023  8:35 AM Lennart Pall, MD [facility] None  02/19/2023  8:35 AM Warden Fillers, MD [facility] None  02/26/2023  8:35 AM Hermina Staggers, MD [facility] None  03/05/2023  8:35 AM Hermina Staggers, MD [facility] None    Lennart Pall, MD

## 2023-01-10 ENCOUNTER — Ambulatory Visit: Payer: Medicaid Other | Attending: Obstetrics

## 2023-01-10 ENCOUNTER — Other Ambulatory Visit: Payer: Self-pay | Admitting: *Deleted

## 2023-01-10 ENCOUNTER — Ambulatory Visit: Payer: Medicaid Other | Admitting: *Deleted

## 2023-01-10 VITALS — BP 123/66 | HR 97

## 2023-01-10 DIAGNOSIS — O99210 Obesity complicating pregnancy, unspecified trimester: Secondary | ICD-10-CM

## 2023-01-10 DIAGNOSIS — O360130 Maternal care for anti-D [Rh] antibodies, third trimester, not applicable or unspecified: Secondary | ICD-10-CM

## 2023-01-10 DIAGNOSIS — B182 Chronic viral hepatitis C: Secondary | ICD-10-CM | POA: Insufficient documentation

## 2023-01-10 DIAGNOSIS — O09513 Supervision of elderly primigravida, third trimester: Secondary | ICD-10-CM

## 2023-01-10 DIAGNOSIS — O98419 Viral hepatitis complicating pregnancy, unspecified trimester: Secondary | ICD-10-CM | POA: Diagnosis present

## 2023-01-10 DIAGNOSIS — O98413 Viral hepatitis complicating pregnancy, third trimester: Secondary | ICD-10-CM

## 2023-01-10 DIAGNOSIS — O26892 Other specified pregnancy related conditions, second trimester: Secondary | ICD-10-CM | POA: Diagnosis present

## 2023-01-10 DIAGNOSIS — E669 Obesity, unspecified: Secondary | ICD-10-CM

## 2023-01-10 DIAGNOSIS — O26899 Other specified pregnancy related conditions, unspecified trimester: Secondary | ICD-10-CM

## 2023-01-10 DIAGNOSIS — O09523 Supervision of elderly multigravida, third trimester: Secondary | ICD-10-CM

## 2023-01-10 DIAGNOSIS — O99212 Obesity complicating pregnancy, second trimester: Secondary | ICD-10-CM

## 2023-01-10 DIAGNOSIS — Z6791 Unspecified blood type, Rh negative: Secondary | ICD-10-CM | POA: Insufficient documentation

## 2023-01-10 DIAGNOSIS — O99213 Obesity complicating pregnancy, third trimester: Secondary | ICD-10-CM

## 2023-01-10 DIAGNOSIS — O09522 Supervision of elderly multigravida, second trimester: Secondary | ICD-10-CM | POA: Diagnosis not present

## 2023-01-10 DIAGNOSIS — Z3A30 30 weeks gestation of pregnancy: Secondary | ICD-10-CM

## 2023-01-22 ENCOUNTER — Ambulatory Visit: Payer: Medicaid Other | Admitting: Student

## 2023-01-22 ENCOUNTER — Other Ambulatory Visit: Payer: Self-pay | Admitting: Student

## 2023-01-22 VITALS — BP 112/68 | HR 98 | Wt 288.8 lb

## 2023-01-22 DIAGNOSIS — O26899 Other specified pregnancy related conditions, unspecified trimester: Secondary | ICD-10-CM

## 2023-01-22 DIAGNOSIS — Z3480 Encounter for supervision of other normal pregnancy, unspecified trimester: Secondary | ICD-10-CM

## 2023-01-22 DIAGNOSIS — Z6791 Unspecified blood type, Rh negative: Secondary | ICD-10-CM

## 2023-01-22 DIAGNOSIS — O99210 Obesity complicating pregnancy, unspecified trimester: Secondary | ICD-10-CM

## 2023-01-22 DIAGNOSIS — E668 Other obesity: Secondary | ICD-10-CM

## 2023-01-22 DIAGNOSIS — O09523 Supervision of elderly multigravida, third trimester: Secondary | ICD-10-CM

## 2023-01-22 DIAGNOSIS — Z3A31 31 weeks gestation of pregnancy: Secondary | ICD-10-CM

## 2023-01-22 DIAGNOSIS — R300 Dysuria: Secondary | ICD-10-CM

## 2023-01-22 DIAGNOSIS — O26893 Other specified pregnancy related conditions, third trimester: Secondary | ICD-10-CM

## 2023-01-22 NOTE — Progress Notes (Signed)
   PRENATAL VISIT NOTE  Subjective:  Megan Valenzuela is a 42 y.o. G2P1001 at [redacted]w[redacted]d being seen today for ongoing prenatal care.  She is currently monitored for the following issues for this high-risk pregnancy and has Supervision of other normal pregnancy, antepartum; Hepatitis C antibody test positive; Rh negative state in antepartum period; AMA (advanced maternal age) multigravida 35+; and Obesity affecting pregnancy on their problem list.  Patient reports  discomfort with urination . States that she has noticed increased pain with voiding.  Contractions: Not present. Vag. Bleeding: None.  Movement: Present. Denies leaking of fluid.   The following portions of the patient's history were reviewed and updated as appropriate: allergies, current medications, past family history, past medical history, past social history, past surgical history and problem list.   Objective:   Vitals:   01/22/23 0840  BP: 112/68  Pulse: 98  Weight: 288 lb 12.8 oz (131 kg)    Fetal Status: Fetal Heart Rate (bpm): 145   Movement: Present     General:  Alert, oriented and cooperative. Patient is in no acute distress.  Skin: Skin is warm and dry. No rash noted.   Cardiovascular: Normal heart rate noted  Respiratory: Normal respiratory effort, no problems with respiration noted  Abdomen: Soft, gravid, appropriate for gestational age.  Pain/Pressure: Absent     Pelvic: Cervical exam deferred        Extremities: Normal range of motion.  Edema: None  Mental Status: Normal mood and affect. Normal behavior. Normal judgment and thought content.   Assessment and Plan:  Pregnancy: G2P1001 at [redacted]w[redacted]d 1. Supervision of other normal pregnancy, antepartum - Frequent and vigorous fetal movement   2. [redacted] weeks gestation of pregnancy - continue bi-weekly follow-up   3. Rh negative state in antepartum period - received rhogam in pregnancy  4. Multigravida of advanced maternal age in third trimester - WNL growth  scans - plan for weekly testing at 34 weeks - monthly growth scan  5. Other obesity affecting pregnancy, antepartum - pregravid BMI: 36 - TWG : 35lbs - WNL growth scans  6. Dysuria during pregnancy in third trimester - Discussed normal symptoms of urinary patterns in late pregnancy. Encouraged increased fluid intake and low sugar intake  - Culture, OB Urine   Preterm labor symptoms and general obstetric precautions including but not limited to vaginal bleeding, contractions, leaking of fluid and fetal movement were reviewed in detail with the patient. Please refer to After Visit Summary for other counseling recommendations.   No follow-ups on file.  Future Appointments  Date Time Provider Department Center  02/05/2023  8:35 AM Lennart Pall, MD [facility] None  02/07/2023  8:45 AM [facility] NURSE [facility]  02/07/2023  9:00 AM [facility] US1 [facility]  02/14/2023  8:30 AM [facility] NURSE [facility]  02/14/2023  8:45 AM [facility] US5 [facility]  02/19/2023  8:35 AM Warden Fillers, MD [facility] None  02/21/2023  8:30 AM [facility] NURSE [facility]  02/21/2023  8:45 AM [facility] US5 [facility]  02/26/2023  8:35 AM Hermina Staggers, MD [facility] None  02/28/2023  8:30 AM [facility] NURSE [facility]  02/28/2023  8:45 AM [facility] US5 [facility]  03/05/2023  8:35 AM Hermina Staggers, MD [facility] None    Corlis Hove, NP

## 2023-01-22 NOTE — Progress Notes (Signed)
Pt reports fetal movement, denies pain.  Pt states that she has had frequent urination with small amounts and would like to be tested for a uti

## 2023-01-24 LAB — CULTURE, OB URINE

## 2023-01-24 LAB — URINE CULTURE, OB REFLEX: Organism ID, Bacteria: NO GROWTH

## 2023-02-05 ENCOUNTER — Ambulatory Visit (INDEPENDENT_AMBULATORY_CARE_PROVIDER_SITE_OTHER): Payer: Medicaid Other | Admitting: Obstetrics and Gynecology

## 2023-02-05 VITALS — BP 118/81 | HR 105 | Wt 290.0 lb

## 2023-02-05 DIAGNOSIS — O09523 Supervision of elderly multigravida, third trimester: Secondary | ICD-10-CM

## 2023-02-05 DIAGNOSIS — Z6836 Body mass index (BMI) 36.0-36.9, adult: Secondary | ICD-10-CM

## 2023-02-05 DIAGNOSIS — Z3480 Encounter for supervision of other normal pregnancy, unspecified trimester: Secondary | ICD-10-CM

## 2023-02-05 DIAGNOSIS — Z6791 Unspecified blood type, Rh negative: Secondary | ICD-10-CM

## 2023-02-05 DIAGNOSIS — Z3A33 33 weeks gestation of pregnancy: Secondary | ICD-10-CM

## 2023-02-05 DIAGNOSIS — R768 Other specified abnormal immunological findings in serum: Secondary | ICD-10-CM

## 2023-02-05 DIAGNOSIS — O26899 Other specified pregnancy related conditions, unspecified trimester: Secondary | ICD-10-CM

## 2023-02-05 NOTE — Progress Notes (Signed)
   PRENATAL VISIT NOTE  Subjective:  Megan Valenzuela is a 42 y.o. G2P1001 at [redacted]w[redacted]d being seen today for ongoing prenatal care.  She is currently monitored for the following issues for this high-risk pregnancy and has Supervision of other normal pregnancy, antepartum; Hepatitis C antibody test positive; Rh negative state in antepartum period; AMA (advanced maternal age) multigravida 35+; and Obesity affecting pregnancy on their problem list.  Patient reports  doing well .  Contractions: Not present. Vag. Bleeding: None.  Movement: Present. Denies leaking of fluid.   The following portions of the patient's history were reviewed and updated as appropriate: allergies, current medications, past family history, past medical history, past social history, past surgical history and problem list.   Objective:   Vitals:   02/05/23 0839  BP: 118/81  Pulse: (!) 105  Weight: 290 lb (131.5 kg)    Fetal Status: Fetal Heart Rate (bpm): 152   Movement: Present     General:  Alert, oriented and cooperative. Patient is in no acute distress.  Skin: Skin is warm and dry. No rash noted.   Cardiovascular: Normal heart rate noted  Respiratory: Normal respiratory effort, no problems with respiration noted  Abdomen: Soft, gravid, appropriate for gestational age.  Pain/Pressure: Absent      Assessment and Plan:  Pregnancy: G2P1001 at [redacted]w[redacted]d 1. Supervision of other normal pregnancy, antepartum 2. [redacted] weeks gestation of pregnancy  3. Hepatitis C antibody test positive VL negative & normal LFTs 12/25/22   4. Rh negative state in antepartum period S/p rhogam 6/19   5. Multigravida of advanced maternal age in third trimester 6. BMI 36.0-36.9,adult Previously declined ldASA @ 30/1: 1611g (55%), AC 64%, 18.49, breech, post - next growth scheduled 8/2 Weekly antenatal testing to start this week Can consider 39wk IOL for AMA w/ age > 40  Preterm labor symptoms and general obstetric precautions including  but not limited to vaginal bleeding, contractions, leaking of fluid and fetal movement were reviewed in detail with the patient.  Please refer to After Visit Summary for other counseling recommendations.   Return in about 2 weeks (around 02/19/2023) for return OB at 35-36 weeks with GBS/GC/CT.  Future Appointments  Date Time Provider Department Center  02/07/2023  8:45 AM [facility] NURSE [facility]  02/07/2023  9:00 AM [facility] US1 [facility]  02/14/2023  8:30 AM [facility] NURSE [facility]  02/14/2023  8:45 AM [facility] US5 [facility]  02/19/2023  8:35 AM Warden Fillers, MD [facility] None  02/21/2023  8:30 AM [facility] NURSE [facility]  02/21/2023  8:45 AM [facility] US5 [facility]  02/26/2023  8:35 AM Hermina Staggers, MD [facility] None  02/28/2023  8:30 AM [facility] NURSE [facility]  02/28/2023  8:45 AM [facility] US5 [facility]  03/05/2023  8:35 AM Hermina Staggers, MD [facility] None    Lennart Pall, MD

## 2023-02-07 ENCOUNTER — Ambulatory Visit: Payer: Medicaid Other | Attending: Obstetrics

## 2023-02-07 ENCOUNTER — Ambulatory Visit: Payer: Medicaid Other | Admitting: *Deleted

## 2023-02-07 VITALS — BP 117/62 | HR 86

## 2023-02-07 DIAGNOSIS — Z3A34 34 weeks gestation of pregnancy: Secondary | ICD-10-CM

## 2023-02-07 DIAGNOSIS — O09523 Supervision of elderly multigravida, third trimester: Secondary | ICD-10-CM | POA: Insufficient documentation

## 2023-02-07 DIAGNOSIS — Z6791 Unspecified blood type, Rh negative: Secondary | ICD-10-CM | POA: Diagnosis present

## 2023-02-07 DIAGNOSIS — O99213 Obesity complicating pregnancy, third trimester: Secondary | ICD-10-CM | POA: Insufficient documentation

## 2023-02-07 DIAGNOSIS — O26899 Other specified pregnancy related conditions, unspecified trimester: Secondary | ICD-10-CM | POA: Diagnosis present

## 2023-02-07 DIAGNOSIS — O360130 Maternal care for anti-D [Rh] antibodies, third trimester, not applicable or unspecified: Secondary | ICD-10-CM | POA: Diagnosis not present

## 2023-02-07 DIAGNOSIS — B182 Chronic viral hepatitis C: Secondary | ICD-10-CM | POA: Insufficient documentation

## 2023-02-07 DIAGNOSIS — O98419 Viral hepatitis complicating pregnancy, unspecified trimester: Secondary | ICD-10-CM | POA: Insufficient documentation

## 2023-02-07 DIAGNOSIS — E669 Obesity, unspecified: Secondary | ICD-10-CM

## 2023-02-14 ENCOUNTER — Ambulatory Visit: Payer: Medicaid Other | Admitting: *Deleted

## 2023-02-14 ENCOUNTER — Ambulatory Visit: Payer: Medicaid Other | Attending: Obstetrics

## 2023-02-14 VITALS — BP 115/60 | HR 89

## 2023-02-14 DIAGNOSIS — Z3A35 35 weeks gestation of pregnancy: Secondary | ICD-10-CM

## 2023-02-14 DIAGNOSIS — O360130 Maternal care for anti-D [Rh] antibodies, third trimester, not applicable or unspecified: Secondary | ICD-10-CM | POA: Diagnosis not present

## 2023-02-14 DIAGNOSIS — O26899 Other specified pregnancy related conditions, unspecified trimester: Secondary | ICD-10-CM | POA: Diagnosis present

## 2023-02-14 DIAGNOSIS — O98413 Viral hepatitis complicating pregnancy, third trimester: Secondary | ICD-10-CM | POA: Diagnosis not present

## 2023-02-14 DIAGNOSIS — O09523 Supervision of elderly multigravida, third trimester: Secondary | ICD-10-CM | POA: Diagnosis not present

## 2023-02-14 DIAGNOSIS — E669 Obesity, unspecified: Secondary | ICD-10-CM

## 2023-02-14 DIAGNOSIS — O99213 Obesity complicating pregnancy, third trimester: Secondary | ICD-10-CM

## 2023-02-14 DIAGNOSIS — Z6791 Unspecified blood type, Rh negative: Secondary | ICD-10-CM | POA: Insufficient documentation

## 2023-02-14 DIAGNOSIS — O98419 Viral hepatitis complicating pregnancy, unspecified trimester: Secondary | ICD-10-CM | POA: Diagnosis present

## 2023-02-14 DIAGNOSIS — B182 Chronic viral hepatitis C: Secondary | ICD-10-CM | POA: Insufficient documentation

## 2023-02-19 ENCOUNTER — Ambulatory Visit (INDEPENDENT_AMBULATORY_CARE_PROVIDER_SITE_OTHER): Payer: Medicaid Other

## 2023-02-19 ENCOUNTER — Other Ambulatory Visit (HOSPITAL_COMMUNITY)
Admission: RE | Admit: 2023-02-19 | Discharge: 2023-02-19 | Disposition: A | Discharge status: Home / Self Care | Payer: Medicaid Other | Source: Ambulatory Visit | Attending: Obstetrics and Gynecology | Admitting: Obstetrics and Gynecology

## 2023-02-19 VITALS — BP 118/71 | HR 92 | Wt 288.9 lb

## 2023-02-19 DIAGNOSIS — Z6836 Body mass index (BMI) 36.0-36.9, adult: Secondary | ICD-10-CM

## 2023-02-19 DIAGNOSIS — O26899 Other specified pregnancy related conditions, unspecified trimester: Secondary | ICD-10-CM

## 2023-02-19 DIAGNOSIS — N898 Other specified noninflammatory disorders of vagina: Secondary | ICD-10-CM

## 2023-02-19 DIAGNOSIS — O26893 Other specified pregnancy related conditions, third trimester: Secondary | ICD-10-CM | POA: Diagnosis not present

## 2023-02-19 DIAGNOSIS — Z6791 Unspecified blood type, Rh negative: Secondary | ICD-10-CM

## 2023-02-19 DIAGNOSIS — O09523 Supervision of elderly multigravida, third trimester: Secondary | ICD-10-CM

## 2023-02-19 DIAGNOSIS — Z3480 Encounter for supervision of other normal pregnancy, unspecified trimester: Secondary | ICD-10-CM | POA: Diagnosis present

## 2023-02-19 DIAGNOSIS — R768 Other specified abnormal immunological findings in serum: Secondary | ICD-10-CM

## 2023-02-19 DIAGNOSIS — Z3A35 35 weeks gestation of pregnancy: Secondary | ICD-10-CM

## 2023-02-19 NOTE — Progress Notes (Signed)
Pt. Presents with a small amount of vaginal leakage. Clear and odorless.

## 2023-02-19 NOTE — Addendum Note (Signed)
Addended by: Jearld Adjutant on: 02/19/2023 09:59 AM   Modules accepted: Orders

## 2023-02-19 NOTE — Progress Notes (Signed)
   PRENATAL VISIT NOTE  Subjective:  Megan Valenzuela is a 42 y.o. G2P1001 at [redacted]w[redacted]d being seen today for ongoing prenatal care.  She is currently monitored for the following issues for this high-risk pregnancy and has Supervision of other normal pregnancy, antepartum; Hepatitis C antibody test positive; Rh negative state in antepartum period; AMA (advanced maternal age) multigravida 35+; and Obesity affecting pregnancy on their problem list.  Patient reports leaking clear watery discharge last night and again this morning. She has not had to wear a pad. It did not soak her clothes. She denies any large gushes. Contractions: Not present. Vag. Bleeding: Small.  Movement: Present.   The following portions of the patient's history were reviewed and updated as appropriate: allergies, current medications, past family history, past medical history, past social history, past surgical history and problem list.   Objective:   Vitals:   02/19/23 0841  BP: 118/71  Pulse: 92  Weight: 288 lb 14.4 oz (131 kg)    Fetal Status: Fetal Heart Rate (bpm): 137   Movement: Present     General:  Alert, oriented and cooperative. Patient is in no acute distress.  Skin: Skin is warm and dry. No rash noted.   Cardiovascular: Normal heart rate noted  Respiratory: Normal respiratory effort, no problems with respiration noted  Abdomen: Soft, gravid, appropriate for gestational age.  Pain/Pressure: Present     Pelvic: Normal genitalia, labia minora mildly erythematous, small amount of physiologic discharge, no pooling of amniotic fluid, cervix visually closed without lesions Cervical exam deferred        Extremities: Normal range of motion.  Edema: None  Mental Status: Normal mood and affect. Normal behavior. Normal judgment and thought content.   Assessment and Plan:  Pregnancy: G2P1001 at [redacted]w[redacted]d 1. Supervision of other normal pregnancy, antepartum - Routine OB. Doing well - GBS and GC/CT today - Anticipatory  guidance for upcoming appointments provided  2. [redacted] weeks gestation of pregnancy - Reports normal fetal movement  3. Hepatitis C antibody test positive - VL negative and LFTs normal on 12/25/22  4. Rh negative state in antepartum period - S/p Rhogam on 12/25/22  5. Multigravida of advanced maternal age in third trimester 6. BMI 36.0-36.9,adult - Continue weekly antenatal testing - 02/07/23: [redacted]w[redacted]d EFW 2656g 74%  7. Vaginal discharge during pregnancy in third trimester - Physiologic discharge noted on exam. No pooling. Reassurance provided  Preterm labor symptoms and general obstetric precautions including but not limited to vaginal bleeding, contractions, leaking of fluid and fetal movement were reviewed in detail with the patient. Please refer to After Visit Summary for other counseling recommendations.    Return in about 1 week (around 02/26/2023) for ROB.  Future Appointments  Date Time Provider Department Center  02/21/2023  8:30 AM [facility] NURSE [facility]  02/21/2023  8:45 AM [facility] US5 [facility]  02/26/2023  8:35 AM Hermina Staggers, MD [facility] None  02/28/2023  8:30 AM [facility] NURSE [facility]  02/28/2023  8:45 AM [facility] US5 [facility]  03/05/2023  8:35 AM Hermina Staggers, MD [facility] None    Brand Males, CNM

## 2023-02-20 LAB — CERVICOVAGINAL ANCILLARY ONLY
Bacterial Vaginitis (gardnerella): NEGATIVE
Candida Glabrata: NEGATIVE
Candida Vaginitis: NEGATIVE
Chlamydia: NEGATIVE
Comment: NEGATIVE
Comment: NEGATIVE
Comment: NEGATIVE
Comment: NEGATIVE
Comment: NEGATIVE
Comment: NORMAL
Neisseria Gonorrhea: NEGATIVE
Trichomonas: NEGATIVE

## 2023-02-21 ENCOUNTER — Ambulatory Visit: Payer: Medicaid Other | Admitting: *Deleted

## 2023-02-21 ENCOUNTER — Ambulatory Visit: Payer: Medicaid Other

## 2023-02-21 VITALS — BP 119/66 | HR 88

## 2023-02-21 DIAGNOSIS — O26899 Other specified pregnancy related conditions, unspecified trimester: Secondary | ICD-10-CM | POA: Insufficient documentation

## 2023-02-21 DIAGNOSIS — Z3A36 36 weeks gestation of pregnancy: Secondary | ICD-10-CM

## 2023-02-21 DIAGNOSIS — Z6791 Unspecified blood type, Rh negative: Secondary | ICD-10-CM | POA: Insufficient documentation

## 2023-02-21 DIAGNOSIS — O98413 Viral hepatitis complicating pregnancy, third trimester: Secondary | ICD-10-CM

## 2023-02-21 DIAGNOSIS — E669 Obesity, unspecified: Secondary | ICD-10-CM

## 2023-02-21 DIAGNOSIS — B182 Chronic viral hepatitis C: Secondary | ICD-10-CM | POA: Insufficient documentation

## 2023-02-21 DIAGNOSIS — O99213 Obesity complicating pregnancy, third trimester: Secondary | ICD-10-CM | POA: Diagnosis present

## 2023-02-21 DIAGNOSIS — O360130 Maternal care for anti-D [Rh] antibodies, third trimester, not applicable or unspecified: Secondary | ICD-10-CM | POA: Diagnosis not present

## 2023-02-21 DIAGNOSIS — O09523 Supervision of elderly multigravida, third trimester: Secondary | ICD-10-CM | POA: Diagnosis not present

## 2023-02-21 DIAGNOSIS — O98419 Viral hepatitis complicating pregnancy, unspecified trimester: Secondary | ICD-10-CM | POA: Insufficient documentation

## 2023-02-23 LAB — CULTURE, BETA STREP (GROUP B ONLY): Strep Gp B Culture: NEGATIVE

## 2023-02-26 ENCOUNTER — Ambulatory Visit (INDEPENDENT_AMBULATORY_CARE_PROVIDER_SITE_OTHER): Payer: Medicaid Other | Admitting: Obstetrics and Gynecology

## 2023-02-26 ENCOUNTER — Encounter: Payer: Self-pay | Admitting: Obstetrics and Gynecology

## 2023-02-26 VITALS — BP 119/77 | HR 93 | Wt 289.0 lb

## 2023-02-26 DIAGNOSIS — Z6791 Unspecified blood type, Rh negative: Secondary | ICD-10-CM

## 2023-02-26 DIAGNOSIS — Z3480 Encounter for supervision of other normal pregnancy, unspecified trimester: Secondary | ICD-10-CM

## 2023-02-26 DIAGNOSIS — O09523 Supervision of elderly multigravida, third trimester: Secondary | ICD-10-CM

## 2023-02-26 DIAGNOSIS — R768 Other specified abnormal immunological findings in serum: Secondary | ICD-10-CM

## 2023-02-26 DIAGNOSIS — O99213 Obesity complicating pregnancy, third trimester: Secondary | ICD-10-CM

## 2023-02-26 DIAGNOSIS — O26899 Other specified pregnancy related conditions, unspecified trimester: Secondary | ICD-10-CM

## 2023-02-26 NOTE — Progress Notes (Signed)
Subjective:  Megan Valenzuela is a 42 y.o. G2P1001 at [redacted]w[redacted]d being seen today for ongoing prenatal care.  She is currently monitored for the following issues for this high-risk pregnancy and has Supervision of other normal pregnancy, antepartum; Hepatitis C antibody test positive; Rh negative state in antepartum period; AMA (advanced maternal age) multigravida 35+; and Obesity affecting pregnancy on their problem list.  Patient reports  general discomforts of pregnancy .  Contractions: Not present. Vag. Bleeding: None.  Movement: Present. Denies leaking of fluid.   The following portions of the patient's history were reviewed and updated as appropriate: allergies, current medications, past family history, past medical history, past social history, past surgical history and problem list. Problem list updated.  Objective:   Vitals:   02/26/23 0836  BP: 119/77  Pulse: 93  Weight: 289 lb (131.1 kg)    Fetal Status: Fetal Heart Rate (bpm): 150 Fundal Height: 37 cm Movement: Present     General:  Alert, oriented and cooperative. Patient is in no acute distress.  Skin: Skin is warm and dry. No rash noted.   Cardiovascular: Normal heart rate noted  Respiratory: Normal respiratory effort, no problems with respiration noted  Abdomen: Soft, gravid, appropriate for gestational age. Pain/Pressure: Present     Pelvic:  Cervical exam deferred        Extremities: Normal range of motion.  Edema: Trace  Mental Status: Normal mood and affect. Normal behavior. Normal judgment and thought content.   Urinalysis:      Assessment and Plan:  Pregnancy: G2P1001 at [redacted]w[redacted]d  1. Supervision of other normal pregnancy, antepartum Stable Labor precautions  2. Hepatitis C antibody test positive  - Hepatitis C RNA quantitative (QUEST) - Comp Met (CMET)  3. Rh negative state in antepartum period S/P Rhogam  4. Multigravida of advanced maternal age in third trimester Serial growth scans and antenatal  testing as per guidelines  5. Obesity affecting pregnancy in third trimester, unspecified obesity type See # 4  Term labor symptoms and general obstetric precautions including but not limited to vaginal bleeding, contractions, leaking of fluid and fetal movement were reviewed in detail with the patient. Please refer to After Visit Summary for other counseling recommendations.  Return in about 1 week (around 03/05/2023) for OB visit, face to face, any provider.   Hermina Staggers, MD

## 2023-02-28 ENCOUNTER — Ambulatory Visit: Payer: Medicaid Other | Admitting: *Deleted

## 2023-02-28 ENCOUNTER — Ambulatory Visit: Payer: Medicaid Other | Attending: Obstetrics

## 2023-02-28 ENCOUNTER — Other Ambulatory Visit: Payer: Self-pay | Admitting: Obstetrics

## 2023-02-28 VITALS — BP 116/67 | HR 86

## 2023-02-28 DIAGNOSIS — O99213 Obesity complicating pregnancy, third trimester: Secondary | ICD-10-CM

## 2023-02-28 DIAGNOSIS — B182 Chronic viral hepatitis C: Secondary | ICD-10-CM | POA: Diagnosis not present

## 2023-02-28 DIAGNOSIS — Z6791 Unspecified blood type, Rh negative: Secondary | ICD-10-CM | POA: Diagnosis present

## 2023-02-28 DIAGNOSIS — O09523 Supervision of elderly multigravida, third trimester: Secondary | ICD-10-CM | POA: Diagnosis not present

## 2023-02-28 DIAGNOSIS — E669 Obesity, unspecified: Secondary | ICD-10-CM

## 2023-02-28 DIAGNOSIS — O98419 Viral hepatitis complicating pregnancy, unspecified trimester: Secondary | ICD-10-CM | POA: Diagnosis present

## 2023-02-28 DIAGNOSIS — O360130 Maternal care for anti-D [Rh] antibodies, third trimester, not applicable or unspecified: Secondary | ICD-10-CM

## 2023-02-28 DIAGNOSIS — O26899 Other specified pregnancy related conditions, unspecified trimester: Secondary | ICD-10-CM | POA: Diagnosis present

## 2023-02-28 DIAGNOSIS — Z3A37 37 weeks gestation of pregnancy: Secondary | ICD-10-CM

## 2023-02-28 DIAGNOSIS — O98413 Viral hepatitis complicating pregnancy, third trimester: Secondary | ICD-10-CM | POA: Diagnosis not present

## 2023-02-28 LAB — COMPREHENSIVE METABOLIC PANEL
ALT: 9 IU/L (ref 0–32)
AST: 13 IU/L (ref 0–40)
Albumin: 3.7 g/dL — ABNORMAL LOW (ref 3.9–4.9)
Alkaline Phosphatase: 105 IU/L (ref 44–121)
BUN/Creatinine Ratio: 8 — ABNORMAL LOW (ref 9–23)
BUN: 6 mg/dL (ref 6–24)
Bilirubin Total: <0.2 mg/dL (ref 0.0–1.2)
CO2: 18 mmol/L — ABNORMAL LOW (ref 20–29)
Calcium: 9.1 mg/dL (ref 8.7–10.2)
Chloride: 101 mmol/L (ref 96–106)
Creatinine, Ser: 0.75 mg/dL (ref 0.57–1.00)
Globulin, Total: 2.8 g/dL (ref 1.5–4.5)
Glucose: 94 mg/dL (ref 70–99)
Potassium: 4.4 mmol/L (ref 3.5–5.2)
Sodium: 136 mmol/L (ref 134–144)
Total Protein: 6.5 g/dL (ref 6.0–8.5)
eGFR: 103 mL/min/{1.73_m2} (ref >59)

## 2023-02-28 LAB — HCV RNA QUANT: Hepatitis C Quantitation: NOT DETECTED [IU]/mL

## 2023-03-05 ENCOUNTER — Encounter: Payer: Self-pay | Admitting: Obstetrics and Gynecology

## 2023-03-05 ENCOUNTER — Ambulatory Visit: Payer: Medicaid Other | Admitting: Obstetrics and Gynecology

## 2023-03-05 VITALS — BP 112/71 | HR 86 | Wt 291.0 lb

## 2023-03-05 DIAGNOSIS — O99213 Obesity complicating pregnancy, third trimester: Secondary | ICD-10-CM

## 2023-03-05 DIAGNOSIS — R768 Other specified abnormal immunological findings in serum: Secondary | ICD-10-CM

## 2023-03-05 DIAGNOSIS — O09523 Supervision of elderly multigravida, third trimester: Secondary | ICD-10-CM

## 2023-03-05 DIAGNOSIS — Z3480 Encounter for supervision of other normal pregnancy, unspecified trimester: Secondary | ICD-10-CM

## 2023-03-05 DIAGNOSIS — O26893 Other specified pregnancy related conditions, third trimester: Secondary | ICD-10-CM

## 2023-03-05 DIAGNOSIS — Z6791 Unspecified blood type, Rh negative: Secondary | ICD-10-CM

## 2023-03-05 DIAGNOSIS — Z3A37 37 weeks gestation of pregnancy: Secondary | ICD-10-CM | POA: Diagnosis not present

## 2023-03-05 NOTE — Progress Notes (Signed)
Subjective:  Megan Valenzuela is a 42 y.o. G2P1001 at [redacted]w[redacted]d being seen today for ongoing prenatal care.  She is currently monitored for the following issues for this high-risk pregnancy and has Supervision of other normal pregnancy, antepartum; Hepatitis C antibody test positive; Rh negative state in antepartum period; AMA (advanced maternal age) multigravida 35+; and Obesity affecting pregnancy on their problem list.  Patient reports  general discomforts of pregnancy .  Contractions: Not present. Vag. Bleeding: None.  Movement: Present. Denies leaking of fluid.   The following portions of the patient's history were reviewed and updated as appropriate: allergies, current medications, past family history, past medical history, past social history, past surgical history and problem list. Problem list updated.  Objective:   Vitals:   03/05/23 0851  BP: 112/71  Pulse: 86  Weight: 291 lb (132 kg)    Fetal Status: Fetal Heart Rate (bpm): 145 Fundal Height: 38 cm Movement: Present     General:  Alert, oriented and cooperative. Patient is in no acute distress.  Skin: Skin is warm and dry. No rash noted.   Cardiovascular: Normal heart rate noted  Respiratory: Normal respiratory effort, no problems with respiration noted  Abdomen: Soft, gravid, appropriate for gestational age. Pain/Pressure: Present     Pelvic:  Cervical exam deferred        Extremities: Normal range of motion.  Edema: None  Mental Status: Normal mood and affect. Normal behavior. Normal judgment and thought content.   Urinalysis:      Assessment and Plan:  Pregnancy: G2P1001 at [redacted]w[redacted]d  1. Supervision of other normal pregnancy, antepartum Stable Labor precautions  2. Hepatitis C antibody test positive Stable VL, negative  3. Rh negative state in antepartum period S/P Rhogam  4. Multigravida of advanced maternal age in third trimester Stable  5. Obesity affecting pregnancy in third trimester, unspecified obesity  type Stable  Term labor symptoms and general obstetric precautions including but not limited to vaginal bleeding, contractions, leaking of fluid and fetal movement were reviewed in detail with the patient. Please refer to After Visit Summary for other counseling recommendations.  Return in about 1 week (around 03/12/2023) for OB visit, face to face, any provider.   Hermina Staggers, MD

## 2023-03-07 ENCOUNTER — Telehealth: Payer: Self-pay | Admitting: Advanced Practice Midwife

## 2023-03-07 ENCOUNTER — Ambulatory Visit: Payer: Medicaid Other | Admitting: *Deleted

## 2023-03-07 ENCOUNTER — Ambulatory Visit: Payer: Medicaid Other | Attending: Obstetrics and Gynecology | Admitting: *Deleted

## 2023-03-07 ENCOUNTER — Encounter (HOSPITAL_COMMUNITY): Payer: Self-pay | Admitting: *Deleted

## 2023-03-07 ENCOUNTER — Telehealth (HOSPITAL_COMMUNITY): Payer: Self-pay | Admitting: *Deleted

## 2023-03-07 VITALS — BP 128/74 | HR 86

## 2023-03-07 DIAGNOSIS — O99213 Obesity complicating pregnancy, third trimester: Secondary | ICD-10-CM | POA: Diagnosis not present

## 2023-03-07 DIAGNOSIS — O09899 Supervision of other high risk pregnancies, unspecified trimester: Secondary | ICD-10-CM

## 2023-03-07 DIAGNOSIS — Z3A38 38 weeks gestation of pregnancy: Secondary | ICD-10-CM | POA: Diagnosis not present

## 2023-03-07 DIAGNOSIS — O09523 Supervision of elderly multigravida, third trimester: Secondary | ICD-10-CM | POA: Insufficient documentation

## 2023-03-07 NOTE — Telephone Encounter (Signed)
Patient called stating she thinks she may have yeast because she had a white paste in her panties.  Patient denied any vaginal symptoms and denies any loss of fluid.    Advised patient to present to MAU should she think her water has broken.    Will wait to treat yeast until her appointment on Wednesday, since patient denies symptoms.

## 2023-03-07 NOTE — Procedures (Signed)
Tranise Tuazon Apr 07, 1981 [redacted]w[redacted]d  Fetus A Non-Stress Test Interpretation for 08/30/24NST only  Indication: Advanced Maternal Age >40 years and obese  Fetal Heart Rate A Mode: External Baseline Rate (A): 130 bpm Variability: Moderate Accelerations: 15 x 15 Decelerations: None Multiple birth?: No  Uterine Activity Mode: Toco Contraction Frequency (min): none Resting Tone Palpated: Relaxed  Interpretation (Fetal Testing) Nonstress Test Interpretation: Reactive Comments: Tracing reviewed by Dr. Parke Poisson

## 2023-03-07 NOTE — Telephone Encounter (Signed)
Preadmission screen  

## 2023-03-12 ENCOUNTER — Ambulatory Visit: Payer: Medicaid Other | Admitting: Advanced Practice Midwife

## 2023-03-12 ENCOUNTER — Encounter: Payer: Self-pay | Admitting: Advanced Practice Midwife

## 2023-03-12 VITALS — BP 108/68 | Wt 297.0 lb

## 2023-03-12 DIAGNOSIS — Z3480 Encounter for supervision of other normal pregnancy, unspecified trimester: Secondary | ICD-10-CM

## 2023-03-12 DIAGNOSIS — O26893 Other specified pregnancy related conditions, third trimester: Secondary | ICD-10-CM

## 2023-03-12 DIAGNOSIS — Z6791 Unspecified blood type, Rh negative: Secondary | ICD-10-CM

## 2023-03-12 DIAGNOSIS — Z3A38 38 weeks gestation of pregnancy: Secondary | ICD-10-CM

## 2023-03-12 DIAGNOSIS — O26899 Other specified pregnancy related conditions, unspecified trimester: Secondary | ICD-10-CM

## 2023-03-12 DIAGNOSIS — R768 Other specified abnormal immunological findings in serum: Secondary | ICD-10-CM

## 2023-03-12 NOTE — Progress Notes (Signed)
   PRENATAL VISIT NOTE  Subjective:  Megan Valenzuela is a 42 y.o. G2P1001 at [redacted]w[redacted]d being seen today for ongoing prenatal care.  She is currently monitored for the following issues for this high-risk pregnancy and has Supervision of other high risk pregnancy, antepartum; Hepatitis C antibody test positive; Rh negative state in antepartum period; AMA (advanced maternal age) multigravida 35+; and Obesity affecting pregnancy on their problem list.  Patient reports no complaints.  Contractions: Not present. Vag. Bleeding: None.  Movement: Present. Denies leaking of fluid.   The following portions of the patient's history were reviewed and updated as appropriate: allergies, current medications, past family history, past medical history, past social history, past surgical history and problem list.   Objective:   Vitals:   03/12/23 0858  BP: 108/68  Weight: 297 lb (134.7 kg)    Fetal Status: Fetal Heart Rate (bpm): 138 Fundal Height: 44 cm Movement: Present     General:  Alert, oriented and cooperative. Patient is in no acute distress.  Skin: Skin is warm and dry. No rash noted.   Cardiovascular: Normal heart rate noted  Respiratory: Normal respiratory effort, no problems with respiration noted  Abdomen: Soft, gravid, appropriate for gestational age.  Pain/Pressure: Present     Pelvic: Cervical exam performed in the presence of a chaperone Dilation: Fingertip Effacement (%): 50 Station: -3  Extremities: Normal range of motion.  Edema: None  Mental Status: Normal mood and affect. Normal behavior. Normal judgment and thought content.   Assessment and Plan:  Pregnancy: G2P1001 at [redacted]w[redacted]d 1. Supervision of other normal pregnancy, antepartum --Anticipatory guidance about next visits/weeks of pregnancy given.  --Desires low intervention. Discussed recommendation for IOL for AMA and obesity. --Pt would like midwife and freedom to move around, and time to discuss procedures --Reviewed labor  readiness with patient including the [facility] [employer], evening primrose oil, and raspberry leaf tea.    2. Hepatitis C antibody test positive   3. Rh negative state in antepartum period --Rhophylac on 6/19  4. [redacted] weeks gestation of pregnancy   Term labor symptoms and general obstetric precautions including but not limited to vaginal bleeding, contractions, leaking of fluid and fetal movement were reviewed in detail with the patient. Please refer to After Visit Summary for other counseling recommendations.   Return in about 1 week (around 03/19/2023) for Midwife preferred.  Future Appointments  Date Time Provider Department Center  03/14/2023  7:00 AM [facility] SCHED ROOM [facility] None  03/14/2023  8:30 AM [facility] NURSE [facility]  03/14/2023  8:45 AM [facility] NST [facility]    Sharen Counter, CNM

## 2023-03-14 ENCOUNTER — Inpatient Hospital Stay (HOSPITAL_COMMUNITY)
Admission: RE | Admit: 2023-03-14 | Discharge: 2023-03-17 | DRG: 807 | Disposition: A | Discharge status: Home / Self Care | Payer: Medicaid Other | Attending: Obstetrics & Gynecology | Admitting: Obstetrics & Gynecology

## 2023-03-14 ENCOUNTER — Ambulatory Visit: Payer: Medicaid Other | Attending: Maternal & Fetal Medicine

## 2023-03-14 ENCOUNTER — Encounter (HOSPITAL_COMMUNITY): Payer: Self-pay | Admitting: Obstetrics and Gynecology

## 2023-03-14 ENCOUNTER — Inpatient Hospital Stay (HOSPITAL_COMMUNITY): Payer: Medicaid Other | Admitting: Anesthesiology

## 2023-03-14 ENCOUNTER — Other Ambulatory Visit: Payer: Self-pay

## 2023-03-14 ENCOUNTER — Ambulatory Visit: Payer: Medicaid Other

## 2023-03-14 ENCOUNTER — Inpatient Hospital Stay (HOSPITAL_COMMUNITY): Payer: Medicaid Other

## 2023-03-14 DIAGNOSIS — Z8249 Family history of ischemic heart disease and other diseases of the circulatory system: Secondary | ICD-10-CM

## 2023-03-14 DIAGNOSIS — R768 Other specified abnormal immunological findings in serum: Secondary | ICD-10-CM | POA: Diagnosis present

## 2023-03-14 DIAGNOSIS — O99214 Obesity complicating childbirth: Principal | ICD-10-CM | POA: Diagnosis present

## 2023-03-14 DIAGNOSIS — O26893 Other specified pregnancy related conditions, third trimester: Secondary | ICD-10-CM | POA: Diagnosis present

## 2023-03-14 DIAGNOSIS — Z6791 Unspecified blood type, Rh negative: Secondary | ICD-10-CM

## 2023-03-14 DIAGNOSIS — O26899 Other specified pregnancy related conditions, unspecified trimester: Secondary | ICD-10-CM

## 2023-03-14 DIAGNOSIS — Z87891 Personal history of nicotine dependence: Secondary | ICD-10-CM | POA: Diagnosis not present

## 2023-03-14 DIAGNOSIS — Z3A39 39 weeks gestation of pregnancy: Secondary | ICD-10-CM | POA: Diagnosis not present

## 2023-03-14 DIAGNOSIS — O99210 Obesity complicating pregnancy, unspecified trimester: Secondary | ICD-10-CM | POA: Diagnosis present

## 2023-03-14 DIAGNOSIS — Z833 Family history of diabetes mellitus: Secondary | ICD-10-CM | POA: Diagnosis not present

## 2023-03-14 DIAGNOSIS — O09523 Supervision of elderly multigravida, third trimester: Secondary | ICD-10-CM | POA: Diagnosis not present

## 2023-03-14 DIAGNOSIS — Z3480 Encounter for supervision of other normal pregnancy, unspecified trimester: Secondary | ICD-10-CM

## 2023-03-14 DIAGNOSIS — O09529 Supervision of elderly multigravida, unspecified trimester: Principal | ICD-10-CM

## 2023-03-14 LAB — COMPREHENSIVE METABOLIC PANEL
ALT: 13 U/L (ref 0–44)
AST: 17 U/L (ref 15–41)
Albumin: 2.9 g/dL — ABNORMAL LOW (ref 3.5–5.0)
Alkaline Phosphatase: 116 U/L (ref 38–126)
Anion gap: 9 (ref 5–15)
BUN: 7 mg/dL (ref 6–20)
CO2: 21 mmol/L — ABNORMAL LOW (ref 22–32)
Calcium: 8.9 mg/dL (ref 8.9–10.3)
Chloride: 105 mmol/L (ref 98–111)
Creatinine, Ser: 0.75 mg/dL (ref 0.44–1.00)
GFR, Estimated: >60 mL/min (ref >60)
Glucose, Bld: 91 mg/dL (ref 70–99)
Potassium: 3.9 mmol/L (ref 3.5–5.1)
Sodium: 135 mmol/L (ref 135–145)
Total Bilirubin: 0.4 mg/dL (ref 0.3–1.2)
Total Protein: 7 g/dL (ref 6.5–8.1)

## 2023-03-14 LAB — HIV ANTIBODY (ROUTINE TESTING W REFLEX): HIV Screen 4th Generation wRfx: NONREACTIVE

## 2023-03-14 LAB — CBC
HCT: 36.2 % (ref 36.0–46.0)
Hemoglobin: 12 g/dL (ref 12.0–15.0)
MCH: 29.6 pg (ref 26.0–34.0)
MCHC: 33.1 g/dL (ref 30.0–36.0)
MCV: 89.2 fL (ref 80.0–100.0)
Platelets: 173 10*3/uL (ref 150–400)
RBC: 4.06 MIL/uL (ref 3.87–5.11)
RDW: 14.8 % (ref 11.5–15.5)
WBC: 7.1 10*3/uL (ref 4.0–10.5)
nRBC: 0 % (ref 0.0–0.2)

## 2023-03-14 LAB — TYPE AND SCREEN
ABO/RH(D): A NEG
Antibody Screen: POSITIVE

## 2023-03-14 MED ORDER — MISOPROSTOL 25 MCG QUARTER TABLET
25.0000 ug | ORAL_TABLET | Freq: Once | ORAL | Status: AC
  Administered 2023-03-14: 25 ug via VAGINAL
  Filled 2023-03-14: qty 1

## 2023-03-14 MED ORDER — FENTANYL-BUPIVACAINE-NACL 0.5-0.125-0.9 MG/250ML-% EP SOLN
12.0000 mL/h | EPIDURAL | Status: DC | PRN
  Administered 2023-03-15 (×2): 12 mL/h via EPIDURAL
  Filled 2023-03-14 (×2): qty 250

## 2023-03-14 MED ORDER — EPHEDRINE 5 MG/ML INJ: 10.0000 mg | INTRAVENOUS | Status: DC | PRN

## 2023-03-14 MED ORDER — LIDOCAINE HCL (PF) 1 % IJ SOLN: 30.0000 mL | INTRAMUSCULAR | Status: DC | PRN

## 2023-03-14 MED ORDER — LACTATED RINGERS IV SOLN: 500.0000 mL | Freq: Once | INTRAVENOUS | Status: DC

## 2023-03-14 MED ORDER — LACTATED RINGERS IV SOLN: INTRAVENOUS | Status: DC

## 2023-03-14 MED ORDER — DIPHENHYDRAMINE HCL 50 MG/ML IJ SOLN
12.5000 mg | INTRAMUSCULAR | Status: DC | PRN
  Administered 2023-03-15 (×2): 12.5 mg via INTRAVENOUS
  Filled 2023-03-14 (×2): qty 1

## 2023-03-14 MED ORDER — PHENYLEPHRINE 80 MCG/ML (10ML) SYRINGE FOR IV PUSH (FOR BLOOD PRESSURE SUPPORT): 80.0000 ug | PREFILLED_SYRINGE | INTRAVENOUS | Status: DC | PRN

## 2023-03-14 MED ORDER — OXYCODONE-ACETAMINOPHEN 5-325 MG PO TABS: 1.0000 | ORAL_TABLET | ORAL | Status: DC | PRN

## 2023-03-14 MED ORDER — OXYTOCIN BOLUS FROM INFUSION
333.0000 mL | Freq: Once | INTRAVENOUS | Status: AC
  Administered 2023-03-16: 333 mL via INTRAVENOUS

## 2023-03-14 MED ORDER — OXYTOCIN-SODIUM CHLORIDE 30-0.9 UT/500ML-% IV SOLN
2.5000 [IU]/h | INTRAVENOUS | Status: DC
  Administered 2023-03-16: 2.5 [IU]/h via INTRAVENOUS
  Filled 2023-03-14 (×2): qty 500

## 2023-03-14 MED ORDER — LACTATED RINGERS IV SOLN
500.0000 mL | INTRAVENOUS | Status: DC | PRN
  Administered 2023-03-15 (×2): 500 mL via INTRAVENOUS

## 2023-03-14 MED ORDER — ONDANSETRON HCL 4 MG/2ML IJ SOLN: 4.0000 mg | Freq: Four times a day (QID) | INTRAMUSCULAR | Status: DC | PRN

## 2023-03-14 MED ORDER — OXYCODONE-ACETAMINOPHEN 5-325 MG PO TABS: 2.0000 | ORAL_TABLET | ORAL | Status: DC | PRN

## 2023-03-14 MED ORDER — ACETAMINOPHEN 325 MG PO TABS: 650.0000 mg | ORAL_TABLET | ORAL | Status: DC | PRN

## 2023-03-14 MED ORDER — MISOPROSTOL 50MCG HALF TABLET
50.0000 ug | ORAL_TABLET | Freq: Once | ORAL | Status: AC
  Administered 2023-03-14: 50 ug via ORAL
  Filled 2023-03-14: qty 1

## 2023-03-14 MED ORDER — SOD CITRATE-CITRIC ACID 500-334 MG/5ML PO SOLN: 30.0000 mL | ORAL | Status: DC | PRN

## 2023-03-14 NOTE — Anesthesia Preprocedure Evaluation (Signed)
Anesthesia Evaluation  Patient identified by MRN, date of birth, ID band Patient awake    Reviewed: Allergy & Precautions, NPO status , Patient's Chart, lab work & pertinent test results  Airway Mallampati: III  TM Distance: >3 FB Neck ROM: Full    Dental no notable dental hx.    Pulmonary asthma , former smoker   Pulmonary exam normal breath sounds clear to auscultation       Cardiovascular negative cardio ROS Normal cardiovascular exam Rhythm:Regular Rate:Normal     Neuro/Psych negative neurological ROS  negative psych ROS   GI/Hepatic negative GI ROS,,,(+) Hepatitis -, C  Endo/Other    Morbid obesity (BMI 45)  Renal/GU negative Renal ROS  negative genitourinary   Musculoskeletal negative musculoskeletal ROS (+)    Abdominal   Peds  Hematology negative hematology ROS (+)   Anesthesia Other Findings IOL for AMA  Reproductive/Obstetrics (+) Pregnancy                             Anesthesia Physical Anesthesia Plan  ASA: 3  Anesthesia Plan: Epidural   Post-op Pain Management:    Induction:   PONV Risk Score and Plan: Treatment may vary due to age or medical condition  Airway Management Planned: Natural Airway  Additional Equipment:   Intra-op Plan:   Post-operative Plan:   Informed Consent: I have reviewed the patients History and Physical, chart, labs and discussed the procedure including the risks, benefits and alternatives for the proposed anesthesia with the patient or authorized representative who has indicated his/her understanding and acceptance.       Plan Discussed with: Anesthesiologist  Anesthesia Plan Comments: (Patient identified. Risks, benefits, options discussed with patient including but not limited to bleeding, infection, nerve damage, paralysis, failed block, incomplete pain control, headache, blood pressure changes, nausea, vomiting, reactions to  medication, itching, and post partum back pain. Confirmed with bedside nurse the patient's most recent platelet count. Confirmed with the patient that they are not taking any anticoagulation, have any bleeding history or any family history of bleeding disorders. Patient expressed understanding and wishes to proceed. All questions were answered. )       Anesthesia Quick Evaluation

## 2023-03-14 NOTE — Progress Notes (Signed)
Patient ID: Megan Valenzuela, female   DOB: 09-17-1980, 42 y.o.   MRN: [id number]  In to meet patient and family; s/p cytotec x 2 doses (last 1848), and now just had SROM @ 2130 with mild cramping  BPs 108/62, 133/80 FHR 135-140s, +accels, no decels Ctx irreg Cx deferred (leaking clear fluid)  IUP@39 .1wks IOL process  Plan to check cx ~2245 for next step  Anticipate vag delivery  Megan Valenzuela CNM 03/14/2023 9:41 PM

## 2023-03-14 NOTE — H&P (Cosign Needed Addendum)
OBSTETRIC ADMISSION HISTORY AND PHYSICAL  Megan Valenzuela is a 42 y.o. female G2P1001 with IUP at [redacted]w[redacted]d by LMP presenting for IOL 2/2 to AMA and obesity. She reports +FMs, No LOF, no VB, no blurry vision, headaches or peripheral edema, and RUQ pain.  She plans on breast feeding. She declined birth control.  She received her prenatal care at [facility]  Dating: By LMP --->  Estimated Date of Delivery: 03/20/23  Sono:    @37  w 1 d, CWD, normal anatomy, cephalic presentation, 3292 g, 73% EFW   Prenatal History/Complications:  Hep C Ab positive Maternal obesity Advanced maternal age  Past Medical History: Past Medical History:  Diagnosis Date   Asthma    BV (bacterial vaginosis)    Dental abscess    Hepatitis C     Past Surgical History: Past Surgical History:  Procedure Laterality Date   TYMPANOSTOMY TUBE PLACEMENT      Obstetrical History: OB History     Gravida  2   Para  1   Term  1   Preterm      AB      Living  1      SAB      IAB      Ectopic      Multiple      Live Births  1           Social History Social History   Socioeconomic History   Marital status: Single    Spouse name: Not on file   Number of children: Not on file   Years of education: Not on file   Highest education level: Not on file  Occupational History   Not on file  Tobacco Use   Smoking status: Former    Types: Cigarettes   Smokeless tobacco: Never  Vaping Use   Vaping status: Never Used  Substance and Sexual Activity   Alcohol use: No   Drug use: No   Sexual activity: Yes    Partners: Male    Birth control/protection: None  Other Topics Concern   Not on file  Social History Narrative   Not on file   Social Determinants of Health   Financial Resource Strain: Not on file  Food Insecurity: No Food Insecurity (03/14/2023)   Hunger Vital Sign    Worried About Running Out of Food in the Last Year: Never true    Ran Out of Food in the Last Year: Never true   Transportation Needs: No Transportation Needs (03/14/2023)   PRAPARE - Administrator, Civil Service (Medical): No    Lack of Transportation (Non-Medical): No  Physical Activity: Not on file  Stress: Not on file  Social Connections: Unknown (11/06/2021)   Received from [facility]   Social Network    Social Network: Not on file    Family History: Family History  Problem Relation Age of Onset   Diabetes Mother    Hypertension Mother    Cancer Maternal Grandmother     Allergies: Allergies  Allergen Reactions   Flagyl [Metronidazole] Other (See Comments)    headache   Keflex [Cephalexin] Other (See Comments)    Stomach Cramps Bruising    Medications Prior to Admission  Medication Sig Dispense Refill Last Dose   Blood Pressure Monitoring (BLOOD PRESSURE KIT) DEVI 1 kit by Does not apply route once a week. 1 each 0    docusate sodium (COLACE) 100 MG capsule Take 100 mg by mouth 2 (two)  times daily.      Misc. Devices (GOJJI WEIGHT SCALE) MISC 1 Device by Does not apply route every 30 (thirty) days. 1 each 0    Prenatal Vit-Fe Fumarate-FA (PRENATAL VITAMINS) 28-0.8 MG TABS Take 1 tablet by mouth daily.        Review of Systems   All systems reviewed and negative except as stated in HPI  Blood pressure 128/75, pulse (!) 101, temperature 98 F (36.7 C), temperature source Oral, resp. rate 18, height 5\' 8"  (1.727 m), weight 135.2 kg, last menstrual period 05/21/2022. General appearance: alert and mildly anxious  Lungs: NWOB on RA  Heart: regular rate and rhythm Abdomen: soft, non-tender; bowel sounds normal Extremities:  no sign of DVT Fetal monitoringBaseline: 140 bpm, Variability: Good {> 6 bpm), Accelerations: Reactive, and Decelerations: Absent Uterine activity - None Dilation: Fingertip Station: Ballotable Exam by:: e Yoshi Mancillas   Prenatal labs: ABO, Rh: --/--/PENDING (09/06 1357) Antibody: PENDING (09/06 1357) Rubella: 1.66 (01/17 1425) RPR: Non  Reactive (06/19 1057)  HBsAg: Negative (01/17 1425)  HIV: Non Reactive (06/19 1057)  GBS: Negative/-- (08/14 1009)  1 hr Glucola wnl Genetic screening  LR female Anatomy US wnl (limited views)   Prenatal Transfer Tool  Maternal Diabetes: No Genetic Screening: Normal Maternal Ultrasounds/Referrals: Normal Fetal Ultrasounds or other Referrals:  None Maternal Substance Abuse:  No Significant Maternal Medications:  None Significant Maternal Lab Results:  Group B Strep negative Number of Prenatal Visits:greater than 3 verified prenatal visits Other Comments:  None  Results for orders placed or performed during the hospital encounter of 03/14/23 (from the past 24 hour(s))  CBC   Collection Time: 03/14/23  1:57 PM  Result Value Ref Range   WBC 7.1 4.0 - 10.5 K/uL   RBC 4.06 3.87 - 5.11 MIL/uL   Hemoglobin 12.0 12.0 - 15.0 g/dL   HCT 16.1 09.6 - 04.5 %   MCV 89.2 80.0 - 100.0 fL   MCH 29.6 26.0 - 34.0 pg   MCHC 33.1 30.0 - 36.0 g/dL   RDW 40.9 81.1 - 91.4 %   Platelets 173 150 - 400 K/uL   nRBC 0.0 0.0 - 0.2 %  Type and screen   Collection Time: 03/14/23  1:57 PM  Result Value Ref Range   ABO/RH(D) PENDING    Antibody Screen PENDING    Sample Expiration      03/17/2023,2359 Performed at [facility], [address] [place], [place] [address]     Patient Active Problem List   Diagnosis Date Noted   Obesity affecting pregnancy 10/18/2022   Rh negative state in antepartum period 10/03/2022   AMA (advanced maternal age) multigravida 35+ 10/03/2022   Hepatitis C antibody test positive 07/26/2022   Supervision of other high risk pregnancy, antepartum 07/24/2022    Assessment/Plan:  Carmello Zemp is a 42 y.o. G2P1001 at [redacted]w[redacted]d here for IOL 2/2 to AMA and obesity   #Labor: patient amenable to cytotec and FB if indicated, will augment as able #Pain: Epidural if/when needed #FWB: Cat 1  #ID:  Gbs negative. Positive Hep C antigen, VL negative. #MOF:  breast #MOC:declined  #Circ:  N/a   Hal Morales, MD  03/14/2023, 2:39 PM  I directly supervised this resident. Wylene Simmer, MD

## 2023-03-14 NOTE — Progress Notes (Signed)
Patient ID: Megan Valenzuela, female   DOB: 1980/07/22, 42 y.o.   MRN: [id number]  Not feeling the ctx as very strong yet  VSS, afebrile FHR 130s, +accels, no decels Ctx q 3 mins after foley placement Cx 1+/thick/vtx -3  IUP@39 .1wk IOL process with unfavorable cx  Cervical foley placed without difficulty and inflated with 60cc fluid, and then within 10 mins ctx became much stronger requiring her to stand by the side of the bed; will assess the need for Pit when the foley dislodges, and will hold on repeat cyto dosing for now  Megan Valenzuela CNM 03/14/2023 11:30 PM

## 2023-03-15 LAB — RPR: RPR Ser Ql: NONREACTIVE

## 2023-03-15 MED ORDER — TERBUTALINE SULFATE 1 MG/ML IJ SOLN: 0.2500 mg | Freq: Once | INTRAMUSCULAR | Status: DC | PRN

## 2023-03-15 MED ORDER — OXYTOCIN-SODIUM CHLORIDE 30-0.9 UT/500ML-% IV SOLN
1.0000 m[IU]/min | INTRAVENOUS | Status: DC
  Administered 2023-03-15: 2 m[IU]/min via INTRAVENOUS

## 2023-03-15 MED ORDER — LIDOCAINE-EPINEPHRINE (PF) 2 %-1:200000 IJ SOLN
INTRAMUSCULAR | Status: DC | PRN
  Administered 2023-03-15: 5 mL via EPIDURAL

## 2023-03-15 NOTE — Anesthesia Procedure Notes (Signed)
Epidural Patient location during procedure: OB Start time: 03/15/2023 12:00 AM End time: 03/15/2023 12:10 AM  Staffing Anesthesiologist: Elmer Picker, MD Performed: anesthesiologist   Preanesthetic Checklist Completed: patient identified, IV checked, risks and benefits discussed, monitors and equipment checked, pre-op evaluation and timeout performed  Epidural Patient position: sitting Prep: DuraPrep and site prepped and draped Patient monitoring: continuous pulse ox, blood pressure, heart rate and cardiac monitor Approach: midline Location: L3-L4 Injection technique: LOR air  Needle:  Needle type: Tuohy  Needle gauge: 17 G Needle length: 9 cm Needle insertion depth: 8 cm Catheter type: closed end flexible Catheter size: 19 Gauge Catheter at skin depth: 14 cm Test dose: negative  Assessment Sensory level: T8 Events: blood not aspirated, no cerebrospinal fluid, injection not painful, no injection resistance, no paresthesia and negative IV test  Additional Notes Patient identified. Risks/Benefits/Options discussed with patient including but not limited to bleeding, infection, nerve damage, paralysis, failed block, incomplete pain control, headache, blood pressure changes, nausea, vomiting, reactions to medication both or allergic, itching and postpartum back pain. Confirmed with bedside nurse the patient's most recent platelet count. Confirmed with patient that they are not currently taking any anticoagulation, have any bleeding history or any family history of bleeding disorders. Patient expressed understanding and wished to proceed. All questions were answered. Sterile technique was used throughout the entire procedure. Please see nursing notes for vital signs. Test dose was given through epidural catheter and negative prior to continuing to dose epidural or start infusion. Warning signs of high block given to the patient including shortness of breath, tingling/numbness in hands,  complete motor block, or any concerning symptoms with instructions to call for help. Patient was given instructions on fall risk and not to get out of bed. All questions and concerns addressed with instructions to call with any issues or inadequate analgesia.  Reason for block:procedure for pain

## 2023-03-15 NOTE — Progress Notes (Signed)
Labor Progress Note  Megan Valenzuela is a 42 y.o. G2P1001 at [redacted]w[redacted]d presented for IOL 2/2 AMA, obesity.   S: mom comfortable, no concerns.   O:  BP (!) 143/82   Pulse (!) 119   Temp 98.7 F (37.1 C) (Oral)   Resp 18   Ht 5\' 8"  (1.727 m)   Wt 135.2 kg   LMP 05/21/2022 (Exact Date)   SpO2 100%   BMI 45.31 kg/m  EFM:150bpm/Mild variability/ 15x15 accels/shallow Variable decels CAT: 2 Toco:  q2-14min   CVE: Dilation: Lip/rim Effacement (%): 90 Station: 0 Presentation: Vertex Exam by:: Dr. Judd Lien   A&P: 42 y.o. G2P1001 [redacted]w[redacted]d  here for IOL as above  #Labor: Progressing well. Still with slightly swollen anterior lip s/p benadryl.  Will try different positioning to try and have lip fall away.  Continue pitocin as well.  #Pain: Epidural #FWB: CAT 2 #GBS negative  #Obesity #AMA: @37  w 1 d, CWD, normal anatomy, cephalic presentation, 3292 g, 73% EFW , normal glucola   #Rh negative: pp w/u    Hessie Dibble, MD FMOB Fellow, Faculty practice [facility], Center for [facility] 03/15/23  7:01 PM

## 2023-03-15 NOTE — Progress Notes (Signed)
Patient ID: Ronni Cubero, female   DOB: 1980/11/01, 42 y.o.   MRN: [id number]  Comfortable w epidural  BPs 105/63, 110/70 FHR 130-140s, +accels, occ mi variables Ctx q 2-3 mins with Pit at 64mu/min Cx deferred (was 4-5/80/vtx -3 per RN exam @ 0530)  IUP@39 .2wks Latent phase of IOL  Continue to keep ctx reg with Pitocin Plan for cx check in 1-2hrs or sooner prn symptoms Anticipate vag delivery  Arabella Merles 03/15/2023 7:04 AM

## 2023-03-15 NOTE — Progress Notes (Signed)
Patient ID: Megan Valenzuela, female   DOB: 03/28/1981, 42 y.o.   MRN: [id number]  Just got comfortable w an epidural; cervical foley out  VSS, afebrile FHR 130s, +accels, no decels, occ mi variables Ctx irreg Cx 4/80/vtx -3 per RN exam  IUP@39 .2wks IOL process  Start Pitocin and uptitrate to achieve active labor Anticipate vag delivery  Arabella Merles CNM 03/15/2023 1:16 AM

## 2023-03-16 ENCOUNTER — Encounter (HOSPITAL_COMMUNITY): Payer: Self-pay | Admitting: Obstetrics & Gynecology

## 2023-03-16 DIAGNOSIS — O09523 Supervision of elderly multigravida, third trimester: Secondary | ICD-10-CM

## 2023-03-16 DIAGNOSIS — O99214 Obesity complicating childbirth: Secondary | ICD-10-CM

## 2023-03-16 DIAGNOSIS — Z3A39 39 weeks gestation of pregnancy: Secondary | ICD-10-CM

## 2023-03-16 MED ORDER — DIPHENHYDRAMINE HCL 25 MG PO CAPS: 25.0000 mg | ORAL_CAPSULE | Freq: Four times a day (QID) | ORAL | Status: DC | PRN

## 2023-03-16 MED ORDER — SENNOSIDES-DOCUSATE SODIUM 8.6-50 MG PO TABS
2.0000 | ORAL_TABLET | ORAL | Status: DC
  Administered 2023-03-16 – 2023-03-17 (×2): 2 via ORAL
  Filled 2023-03-16 (×2): qty 2

## 2023-03-16 MED ORDER — WITCH HAZEL-GLYCERIN EX PADS: 1.0000 | MEDICATED_PAD | CUTANEOUS | Status: DC | PRN

## 2023-03-16 MED ORDER — DIBUCAINE (PERIANAL) 1 % EX OINT: 1.0000 | TOPICAL_OINTMENT | CUTANEOUS | Status: DC | PRN

## 2023-03-16 MED ORDER — ONDANSETRON HCL 4 MG/2ML IJ SOLN: 4.0000 mg | INTRAMUSCULAR | Status: DC | PRN

## 2023-03-16 MED ORDER — ONDANSETRON HCL 4 MG PO TABS: 4.0000 mg | ORAL_TABLET | ORAL | Status: DC | PRN

## 2023-03-16 MED ORDER — SODIUM CHLORIDE 0.9% FLUSH: 3.0000 mL | INTRAVENOUS | Status: DC | PRN

## 2023-03-16 MED ORDER — IBUPROFEN 600 MG PO TABS
600.0000 mg | ORAL_TABLET | Freq: Four times a day (QID) | ORAL | Status: DC
  Administered 2023-03-16 – 2023-03-17 (×6): 600 mg via ORAL
  Filled 2023-03-16 (×6): qty 1

## 2023-03-16 MED ORDER — BENZOCAINE-MENTHOL 20-0.5 % EX AERO: 1.0000 | INHALATION_SPRAY | CUTANEOUS | Status: DC | PRN

## 2023-03-16 MED ORDER — SODIUM CHLORIDE 0.9 % IV SOLN: 250.0000 mL | INTRAVENOUS | Status: DC | PRN

## 2023-03-16 MED ORDER — SODIUM CHLORIDE 0.9% FLUSH
3.0000 mL | Freq: Two times a day (BID) | INTRAVENOUS | Status: DC
  Administered 2023-03-16 (×2): 3 mL via INTRAVENOUS

## 2023-03-16 MED ORDER — RHO D IMMUNE GLOBULIN 1500 UNIT/2ML IJ SOSY
300.0000 ug | PREFILLED_SYRINGE | Freq: Once | INTRAMUSCULAR | Status: DC
  Filled 2023-03-16: qty 2

## 2023-03-16 MED ORDER — SIMETHICONE 80 MG PO CHEW: 80.0000 mg | CHEWABLE_TABLET | ORAL | Status: DC | PRN

## 2023-03-16 MED ORDER — COCONUT OIL OIL: 1.0000 | TOPICAL_OIL | Status: DC | PRN

## 2023-03-16 MED ORDER — ACETAMINOPHEN 325 MG PO TABS
650.0000 mg | ORAL_TABLET | ORAL | Status: DC | PRN
  Administered 2023-03-16: 650 mg via ORAL
  Filled 2023-03-16: qty 2

## 2023-03-16 MED ORDER — ENOXAPARIN SODIUM 80 MG/0.8ML IJ SOSY
65.0000 mg | PREFILLED_SYRINGE | INTRAMUSCULAR | Status: DC
  Administered 2023-03-16: 65 mg via SUBCUTANEOUS
  Filled 2023-03-16: qty 0.8

## 2023-03-16 MED ORDER — PRENATAL MULTIVITAMIN CH
1.0000 | ORAL_TABLET | Freq: Every day | ORAL | Status: DC
  Administered 2023-03-16 – 2023-03-17 (×2): 1 via ORAL
  Filled 2023-03-16 (×2): qty 1

## 2023-03-16 NOTE — Progress Notes (Signed)
Labor Progress Note Megan Valenzuela is a 42 y.o. G2P1001 at [redacted]w[redacted]d presented for IOL 2/2 obesity.  S: Pt comfortable at bedside, not feeling urge to push.  O:  BP 132/67   Pulse 91   Temp 98.8 F (37.1 C) (Oral)   Resp 16   Ht 5\' 8"  (1.727 m)   Wt 135.2 kg   LMP 05/21/2022 (Exact Date)   SpO2 100%   BMI 45.31 kg/m  EFM: 150/min/+a/-d  CVE: 9.5cm -- very swollen ant lip/90/0   A&P: 42 y.o. G2P1001 [redacted]w[redacted]d here for IOL 2/2 BMI>40 #Labor: Anterior lip still present and quite swollen despite benadryl/avoiding add'l pressure, will apply ice to area intermittently to see if this helps any and reassess. Unable to reduce anterior cervix. We are almost at 3hr mark of no cervical change, so would consider discussion of c/s if remains unchanged #Pain: Epidural #FWB: Cat II strip, cont to monitor #GBS negative  #Rh neg: will need Rhophylac and w/up PP #Morbid obesity: Lovenox for DVT ppx PP  Sundra Aland, MD 2:18 AM

## 2023-03-16 NOTE — Discharge Summary (Signed)
Postpartum Discharge Summary  Date of Service updated***     Patient Name: Megan Valenzuela DOB: Aug 06, 1980 MRN: [id number]  Date of admission: 03/14/2023 Delivery date:03/16/2023 Delivering provider: Sundra Aland Date of discharge: 03/16/2023  Admitting diagnosis: AMA (advanced maternal age) multigravida 35+ [O09.529] Intrauterine pregnancy: [redacted]w[redacted]d     Secondary diagnosis:  Principal Problem:   SVD (spontaneous vaginal delivery) Active Problems:   False positive serological test for hepatitis C   Rh negative state in antepartum period   AMA (advanced maternal age) multigravida 35+   Obesity affecting pregnancy  Additional problems: ***    Discharge diagnosis: Term Pregnancy Delivered and obesity, AMA                                               Post partum procedures:{Postpartum procedures:23558} Augmentation: AROM, Pitocin, Cytotec, and IP Foley Complications: {OB Labor/Delivery Complications:20784}  Hospital course: Induction of Labor With Vaginal Delivery   42 y.o. yo G2P1001 at [redacted]w[redacted]d was admitted to the hospital 03/14/2023 for induction of labor.  Indication for induction: AMA and morbid obesity .  Patient had an labor course complicated by prolonged active stage of labor.  Membrane Rupture Time/Date: 9:30 PM,03/14/2023  Delivery Method:Vaginal, Spontaneous Operative Delivery:N/A Episiotomy: None Lacerations:  Vaginal Details of delivery can be found in separate delivery note.  Patient had a postpartum course complicated by***. Patient is discharged home 03/16/23.  Newborn Data: Birth date:03/16/2023 Birth time:12:40 AM Gender:Female Living status:Living Apgars:8 ,9  Weight:   Magnesium Sulfate received: No BMZ received: No Rhophylac:Yes MMR:N/A T-DaP:Given prenatally Flu: N/A Transfusion:{Transfusion received:30440034}  Physical exam  Vitals:   03/15/23 2200 03/15/23 2230 03/15/23 2300 03/15/23 2330  BP: (!) 141/90 131/79 128/83 119/63  Pulse: (!) 114  (!) 106 (!) 106 94  Resp:      Temp:      TempSrc:      SpO2:      Weight:      Height:       General: {Exam; general:21111117} Lochia: {Desc; appropriate/inappropriate:30686::"appropriate"} Uterine Fundus: {Desc; firm/soft:30687} Incision: {Exam; incision:21111123} DVT Evaluation: {Exam; dvt:2111122} Labs: Lab Results  Component Value Date   WBC 7.1 03/14/2023   HGB 12.0 03/14/2023   HCT 36.2 03/14/2023   MCV 89.2 03/14/2023   PLT 173 03/14/2023      Latest Ref Rng & Units 03/14/2023    1:57 PM  CMP  Glucose 70 - 99 mg/dL 91   BUN 6 - 20 mg/dL 7   Creatinine 4.09 - 8.11 mg/dL 9.14   Sodium 782 - 956 mmol/L 135   Potassium 3.5 - 5.1 mmol/L 3.9   Chloride 98 - 111 mmol/L 105   CO2 22 - 32 mmol/L 21   Calcium 8.9 - 10.3 mg/dL 8.9   Total Protein 6.5 - 8.1 g/dL 7.0   Total Bilirubin 0.3 - 1.2 mg/dL 0.4   Alkaline Phos 38 - 126 U/L 116   AST 15 - 41 U/L 17   ALT 0 - 44 U/L 13    Edinburgh Score:     No data to display           After visit meds:  Allergies as of 03/16/2023       Reactions   Flagyl [metronidazole] Other (See Comments)   headache   Keflex [cephalexin] Other (See Comments)   Stomach Cramps Bruising  Latex Itching, Rash   Pt states that she has latex allergy. Pt states that she experiences rash and itching with latex. 03/15/2023 2100  Veneta Penton, RN     Med Rec must be completed prior to using this [facility]***        Discharge home in stable condition Infant Feeding: {Baby feeding:23562} Infant Disposition:{CHL IP OB HOME WITH WUJWJX:91478} Discharge instruction: per After Visit Summary and Postpartum booklet. Activity: Advance as tolerated. Pelvic rest for 6 weeks.  Diet: {OB GNFA:21308657} Future Appointments:No future appointments. Follow up Visit: Message sent to [facility] 9/8  Please schedule this patient for a In person postpartum visit in 6 weeks with the following provider: Any provider. Additional Postpartum F/U: none    High risk pregnancy complicated by:  AMA, obesity Delivery mode:  Vaginal, Spontaneous Anticipated Birth Control:  Unsure   03/16/2023 Sundra Aland, MD

## 2023-03-16 NOTE — Lactation Note (Addendum)
This note was copied from a baby's chart. Lactation Consultation Note  Patient Name: Megan Valenzuela [id number] Date: 03/16/2023 Age:42 years Reason for consult: Initial assessment;Term  Visited P2 parent of a term baby. Birth Parent breastfed first baby for 1 year in 2005. LC assisted with hand expression - Birth Parent has easily expressed colostrum. Baby was sleepy and reluctant to latch, but when latched, was able to grasp and suck. Birth Parent is comfortable with breastfeeding and is an experienced breastfeeder. LC reviewed LC services brochure as well.   Feeding Plan:  1) Breastfeed baby skin to skin every 2-3 hours, or sooner on demand if baby cues.  2) Call RN/LC for breastfeeding assistance.   Maternal Data Has patient been taught Hand Expression?: Yes Does the patient have breastfeeding experience prior to this delivery?: Yes How long did the patient breastfeed?: 1 year in 2005  Feeding Mother's Current Feeding Choice: Breast Milk  LATCH Score Latch: Repeated attempts needed to sustain latch, nipple held in mouth throughout feeding, stimulation needed to elicit sucking reflex.  Audible Swallowing: A few with stimulation  Type of Nipple: Everted at rest and after stimulation  Comfort (Breast/Nipple): Soft / non-tender  Hold (Positioning): No assistance needed to correctly position infant at breast.  LATCH Score: 8  Interventions Interventions: Assisted with latch;Breast feeding basics reviewed;Skin to skin;Breast compression;Adjust position;Support pillows;Position options  Discharge Pump: DEBP;Personal WIC Program: No  Consult Status Consult Status: Follow-up Date: 03/17/23 Follow-up type: In-patient    Antionette Char 03/16/2023, 9:43 AM

## 2023-03-16 NOTE — Anesthesia Postprocedure Evaluation (Signed)
Anesthesia Post Note  Patient: Megan Valenzuela  Procedure(s) Performed: AN AD HOC LABOR EPIDURAL     Patient location during evaluation: Mother Baby Anesthesia Type: Epidural Level of consciousness: awake, oriented and awake and alert Pain management: pain level controlled Vital Signs Assessment: post-procedure vital signs reviewed and stable Respiratory status: spontaneous breathing, respiratory function stable and nonlabored ventilation Cardiovascular status: stable Postop Assessment: no headache, adequate PO intake, able to ambulate, patient able to bend at knees and no apparent nausea or vomiting Anesthetic complications: no   No notable events documented.  Last Vitals:  Vitals:   03/16/23 0420 03/16/23 0820  BP: 128/78 130/74  Pulse: 85 80  Resp: 18 18  Temp: 36.9 C 36.8 C  SpO2: 99% 100%    Last Pain:  Vitals:   03/16/23 0900  TempSrc:   PainSc: 4    Pain Goal: Patients Stated Pain Goal: 0 (03/15/23 1530)              Epidural/Spinal Function Cutaneous sensation: Normal sensation (03/16/23 0820), Patient able to flex knees: Yes (03/16/23 0820), Patient able to lift hips off bed: Yes (03/16/23 0820), Back pain beyond tenderness at insertion site: No (03/16/23 0820), Progressively worsening motor and/or sensory loss: No (03/16/23 0820), Bowel and/or bladder incontinence post epidural: No (03/16/23 0820)  Javonn Gauger

## 2023-03-16 NOTE — Plan of Care (Signed)

## 2023-03-16 NOTE — Lactation Note (Signed)
This note was copied from a baby's chart. Lactation Consultation Note  Patient Name: Megan Valenzuela [id number] Date: 03/16/2023 Age:40 hours Reason for consult: Term;Mother's request;Follow-up assessment  Visited P2 Birth Parent for follow up latch assessment per Birth Parent's request. Birth Parent mentioned the pediatrician came in and recommended formula - Birth Parent stated she will not be giving formula and that her breast is her baby's bottle.  Upon entry, Birth Parent was sitting up with her older daughter on the couch and was breastfeeding her baby with easy. Birth Parent reports latch started at 1236PM and baby was still latched upon [facility] exit at 1315 PM. Baby is actively breastfeeding with productive sucks and audible swallows. Birth Parent did not need assistance with latching effectively and appropriately.   Birth Parent is an experienced breastfeeder and feels comfortable with breastfeeding techniques.  Feeding Plan:  1) Breastfeed baby skin to skin, every 2-3 hours or sooner on demand. 2) Call RN/LC for breastfeeding assistance.   Maternal Data Has patient been taught Hand Expression?: Yes Does the patient have breastfeeding experience prior to this delivery?: Yes How long did the patient breastfeed?: 1 year in 2005  Feeding Mother's Current Feeding Choice: Breast Milk  LATCH Score Latch: Grasps breast easily, tongue down, lips flanged, rhythmical sucking.  Audible Swallowing: Spontaneous and intermittent  Type of Nipple: Everted at rest and after stimulation  Comfort (Breast/Nipple): Soft / non-tender  Hold (Positioning): Assistance needed to correctly position infant at breast and maintain latch.  LATCH Score: 9   Lactation Tools Discussed/Used    Interventions Interventions: Breast feeding basics reviewed;Education  Discharge    Consult Status Consult Status: Follow-up Date: 03/17/23 Follow-up type: In-patient    Antionette Char 03/16/2023, 1:11  PM

## 2023-03-17 ENCOUNTER — Encounter (HOSPITAL_COMMUNITY): Payer: Medicaid Other

## 2023-03-17 MED ORDER — IBUPROFEN 600 MG PO TABS: 600.0000 mg | ORAL_TABLET | Freq: Four times a day (QID) | ORAL | 0 refills | Status: DC

## 2023-03-17 NOTE — Discharge Instructions (Signed)
WHAT TO LOOK OUT FOR: Fever of 100.4 or above Mastitis: feels like flu and breasts hurt Infection: increased pain, swelling or redness Blood clots golf ball size or larger Postpartum depression   Congratulations on your newest addition!

## 2023-03-17 NOTE — Plan of Care (Signed)
Problem: Education: Goal: Knowledge of General Education information will improve Description: Including pain rating scale, medication(s)/side effects and non-pharmacologic comfort measures 03/17/2023 1029 by Donne Hazel, LPN Outcome: Adequate for Discharge 03/17/2023 0730 by Donne Hazel, LPN Outcome: Progressing   Problem: Health Behavior/Discharge Planning: Goal: Ability to manage health-related needs will improve 03/17/2023 1029 by Donne Hazel, LPN Outcome: Adequate for Discharge 03/17/2023 0730 by Donne Hazel, LPN Outcome: Progressing   Problem: Clinical Measurements: Goal: Ability to maintain clinical measurements within normal limits will improve 03/17/2023 1029 by Donne Hazel, LPN Outcome: Adequate for Discharge 03/17/2023 0730 by Donne Hazel, LPN Outcome: Progressing Goal: Will remain free from infection 03/17/2023 1029 by Donne Hazel, LPN Outcome: Adequate for Discharge 03/17/2023 0730 by Donne Hazel, LPN Outcome: Progressing Goal: Diagnostic test results will improve 03/17/2023 1029 by Donne Hazel, LPN Outcome: Adequate for Discharge 03/17/2023 0730 by Donne Hazel, LPN Outcome: Progressing Goal: Respiratory complications will improve 03/17/2023 1029 by Donne Hazel, LPN Outcome: Adequate for Discharge 03/17/2023 0730 by Donne Hazel, LPN Outcome: Progressing Goal: Cardiovascular complication will be avoided 03/17/2023 1029 by Donne Hazel, LPN Outcome: Adequate for Discharge 03/17/2023 0730 by Donne Hazel, LPN Outcome: Progressing   Problem: Activity: Goal: Risk for activity intolerance will decrease 03/17/2023 1029 by Donne Hazel, LPN Outcome: Adequate for Discharge 03/17/2023 0730 by Donne Hazel, LPN Outcome: Progressing   Problem: Nutrition: Goal: Adequate nutrition will be maintained 03/17/2023 1029 by Donne Hazel, LPN Outcome: Adequate for Discharge 03/17/2023 0730 by Donne Hazel, LPN Outcome: Progressing   Problem:  Coping: Goal: Level of anxiety will decrease 03/17/2023 1029 by Donne Hazel, LPN Outcome: Adequate for Discharge 03/17/2023 0730 by Donne Hazel, LPN Outcome: Progressing   Problem: Elimination: Goal: Will not experience complications related to bowel motility 03/17/2023 1029 by Donne Hazel, LPN Outcome: Adequate for Discharge 03/17/2023 0730 by Donne Hazel, LPN Outcome: Progressing Goal: Will not experience complications related to urinary retention 03/17/2023 1029 by Donne Hazel, LPN Outcome: Adequate for Discharge 03/17/2023 0730 by Donne Hazel, LPN Outcome: Progressing   Problem: Pain Managment: Goal: General experience of comfort will improve 03/17/2023 1029 by Donne Hazel, LPN Outcome: Adequate for Discharge 03/17/2023 0730 by Donne Hazel, LPN Outcome: Progressing   Problem: Safety: Goal: Ability to remain free from injury will improve 03/17/2023 1029 by Donne Hazel, LPN Outcome: Adequate for Discharge 03/17/2023 0730 by Donne Hazel, LPN Outcome: Progressing   Problem: Skin Integrity: Goal: Risk for impaired skin integrity will decrease 03/17/2023 1029 by Donne Hazel, LPN Outcome: Adequate for Discharge 03/17/2023 0730 by Donne Hazel, LPN Outcome: Progressing   Problem: Education: Goal: Knowledge of Childbirth will improve 03/17/2023 1029 by Donne Hazel, LPN Outcome: Adequate for Discharge 03/17/2023 0730 by Donne Hazel, LPN Outcome: Progressing Goal: Ability to make informed decisions regarding treatment and plan of care will improve 03/17/2023 1029 by Donne Hazel, LPN Outcome: Adequate for Discharge 03/17/2023 0730 by Donne Hazel, LPN Outcome: Progressing Goal: Ability to state and carry out methods to decrease the pain will improve 03/17/2023 1029 by Donne Hazel, LPN Outcome: Adequate for Discharge 03/17/2023 0730 by Donne Hazel, LPN Outcome: Progressing Goal: Individualized Educational Video(s) 03/17/2023 1029 by Donne Hazel,  LPN Outcome: Adequate for Discharge 03/17/2023 0730 by Donne Hazel, LPN Outcome: Progressing   Problem: Coping: Goal: Ability to verbalize concerns and feelings  about labor and delivery will improve 03/17/2023 1029 by Donne Hazel, LPN Outcome: Adequate for Discharge 03/17/2023 0730 by Donne Hazel, LPN Outcome: Progressing   Problem: Life Cycle: Goal: Ability to make normal progression through stages of labor will improve 03/17/2023 1029 by Donne Hazel, LPN Outcome: Adequate for Discharge 03/17/2023 0730 by Donne Hazel, LPN Outcome: Progressing Goal: Ability to effectively push during vaginal delivery will improve 03/17/2023 1029 by Donne Hazel, LPN Outcome: Adequate for Discharge 03/17/2023 0730 by Donne Hazel, LPN Outcome: Progressing   Problem: Role Relationship: Goal: Will demonstrate positive interactions with the child 03/17/2023 1029 by Donne Hazel, LPN Outcome: Adequate for Discharge 03/17/2023 0730 by Donne Hazel, LPN Outcome: Progressing   Problem: Safety: Goal: Risk of complications during labor and delivery will decrease 03/17/2023 1029 by Donne Hazel, LPN Outcome: Adequate for Discharge 03/17/2023 0730 by Donne Hazel, LPN Outcome: Progressing   Problem: Pain Management: Goal: Relief or control of pain from uterine contractions will improve 03/17/2023 1029 by Donne Hazel, LPN Outcome: Adequate for Discharge 03/17/2023 0730 by Donne Hazel, LPN Outcome: Progressing   Problem: Education: Goal: Knowledge of condition will improve 03/17/2023 1029 by Donne Hazel, LPN Outcome: Adequate for Discharge 03/17/2023 0730 by Donne Hazel, LPN Outcome: Progressing Goal: Individualized Educational Video(s) 03/17/2023 1029 by Donne Hazel, LPN Outcome: Adequate for Discharge 03/17/2023 0730 by Donne Hazel, LPN Outcome: Progressing Goal: Individualized Newborn Educational Video(s) 03/17/2023 1029 by Donne Hazel, LPN Outcome: Adequate for  Discharge 03/17/2023 0730 by Donne Hazel, LPN Outcome: Progressing   Problem: Activity: Goal: Will verbalize the importance of balancing activity with adequate rest periods 03/17/2023 1029 by Donne Hazel, LPN Outcome: Adequate for Discharge 03/17/2023 0730 by Donne Hazel, LPN Outcome: Progressing Goal: Ability to tolerate increased activity will improve 03/17/2023 1029 by Donne Hazel, LPN Outcome: Adequate for Discharge 03/17/2023 0730 by Donne Hazel, LPN Outcome: Progressing   Problem: Coping: Goal: Ability to identify and utilize available resources and services will improve 03/17/2023 1029 by Donne Hazel, LPN Outcome: Adequate for Discharge 03/17/2023 0730 by Donne Hazel, LPN Outcome: Progressing   Problem: Life Cycle: Goal: Chance of risk for complications during the postpartum period will decrease 03/17/2023 1029 by Donne Hazel, LPN Outcome: Adequate for Discharge 03/17/2023 0730 by Donne Hazel, LPN Outcome: Progressing   Problem: Role Relationship: Goal: Ability to demonstrate positive interaction with newborn will improve 03/17/2023 1029 by Donne Hazel, LPN Outcome: Adequate for Discharge 03/17/2023 0730 by Donne Hazel, LPN Outcome: Progressing   Problem: Skin Integrity: Goal: Demonstration of wound healing without infection will improve 03/17/2023 1029 by Donne Hazel, LPN Outcome: Adequate for Discharge 03/17/2023 0730 by Donne Hazel, LPN Outcome: Progressing

## 2023-03-17 NOTE — Plan of Care (Signed)

## 2023-03-19 ENCOUNTER — Encounter: Payer: Medicaid Other | Admitting: Obstetrics and Gynecology

## 2023-03-25 ENCOUNTER — Encounter (HOSPITAL_COMMUNITY): Payer: Self-pay | Admitting: Obstetrics & Gynecology

## 2023-04-16 ENCOUNTER — Telehealth (HOSPITAL_COMMUNITY): Payer: Self-pay | Admitting: *Deleted

## 2023-04-16 NOTE — Telephone Encounter (Signed)
Attempted hospital discharge follow-up call. Left message for patient to return RN call with any questions or concerns. Deforest Hoyles, RN, 04/16/23, [phone]

## 2023-05-01 ENCOUNTER — Ambulatory Visit: Payer: Medicaid Other | Admitting: Obstetrics and Gynecology

## 2023-05-01 DIAGNOSIS — N912 Amenorrhea, unspecified: Secondary | ICD-10-CM | POA: Diagnosis not present

## 2023-05-01 DIAGNOSIS — Z1231 Encounter for screening mammogram for malignant neoplasm of breast: Secondary | ICD-10-CM

## 2023-05-01 DIAGNOSIS — Z1211 Encounter for screening for malignant neoplasm of colon: Secondary | ICD-10-CM | POA: Diagnosis not present

## 2023-05-01 DIAGNOSIS — Z0000 Encounter for general adult medical examination without abnormal findings: Secondary | ICD-10-CM

## 2023-05-01 NOTE — Progress Notes (Signed)
Post Partum Visit Note  Megan Valenzuela is a 42 y.o. [id number] female who presents for a postpartum visit. She is 6 weeks postpartum following a normal spontaneous vaginal delivery.  I have fully reviewed the prenatal and intrapartum course. The delivery was at 39 gestational weeks.  Anesthesia: epidural. Postpartum course has been good. Baby is doing well yes. Baby is feeding by breast. Bleeding no bleeding. Bowel function is normal. Bladder function is normal. Patient is sexually active. Contraception method is none. Postpartum depression screening: negative.  Not yet restarted menses, sexually active without contraception.  The pregnancy intention screening data noted above was reviewed. Potential methods of contraception were discussed. The patient elected to proceed with No data recorded.   Edinburgh Postnatal Depression Scale - 05/01/23 1035       Edinburgh Postnatal Depression Scale:  In the Past 7 Days   I have been able to laugh and see the funny side of things. 0    I have looked forward with enjoyment to things. 0    I have blamed myself unnecessarily when things went wrong. 0    I have been anxious or worried for no good reason. 0    I have felt scared or panicky for no good reason. 0    Things have been getting on top of me. 0    I have been so unhappy that I have had difficulty sleeping. 0    I have felt sad or miserable. 0    I have been so unhappy that I have been crying. 0    The thought of harming myself has occurred to me. 0    Edinburgh Postnatal Depression Scale Total 0             Health Maintenance Due  Topic Date Due   INFLUENZA VACCINE  Never done   COVID-19 Vaccine (1 - 2023-24 season) Never done    Review of Systems Pertinent items are noted in HPI.  POCT urine pregnancy: Negative  Objective:  BP 132/86   Pulse 68   Wt 269 lb 8 oz (122.2 kg)   LMP 05/21/2022 (Exact Date)   Breastfeeding Yes   BMI 40.98 kg/m    General:  alert,  cooperative, appears stated age, and no distress   Breasts:  normal  Lungs: Effortless symmetric breathing  Heart:  regular rate and rhythm  Abdomen: soft, non-tender; bowel sounds normal; no masses,  no organomegaly   Wound well approximated incision  GU exam:   deffered       Assessment:    1. Postpartum care and examination Well appearing, no concerns NFP preferred Some interest in ParaGard, handout provided  Normal postpartum exam.   Plan:   Essential components of care per ACOG recommendations:  1.  Mood and well being: Patient with negative depression screening today. Reviewed local resources for support.  - Patient tobacco use? No.   - hx of drug use? No.    2. Infant care and feeding:  -Patient currently breastmilk feeding? Yes. Discussed returning to work and pumping. Reviewed importance of draining breast regularly to support lactation.  -Social determinants of health (SDOH) reviewed in EPIC. No concerns. The following needs were identified: none  3. Sexuality, contraception and birth spacing - Patient does not want a pregnancy in the next year.  Desired family size is 3 children.  - Reviewed reproductive life planning. Reviewed contraceptive methods based on pt preferences and effectiveness.  Patient desired No Method -  No Contraceptive Precautions today.  Discussed ParaGard as desires hormone free. Discussed natural family planning. Provided handouts for both.  - Discussed birth spacing of 18 months  4. Sleep and fatigue -Encouraged family/partner/community support of 4 hrs of uninterrupted sleep to help with mood and fatigue  5. Physical Recovery  - Discussed patients delivery and complications. She describes her labor as mixed. - Patient had a Vaginal, no problems at delivery. Patient had a 1st degree laceration. Perineal healing reviewed. Patient expressed understanding - Patient has urinary incontinence? No. - Patient is safe to resume physical and sexual  activity  6.  Health Maintenance - HM due items addressed Yes - Last pap smear  Diagnosis  Date Value Ref Range Status  09/05/2022   Final   - Negative for intraepithelial lesion or malignancy (NILM)   Pap smear not done at today's visit.  -Breast Cancer screening indicated? Yes. Patient referred today for mammogram.  - Colonoscopy; not interested but will to do Cologuard - ordered  7. Chronic Disease/Pregnancy Condition follow up:  Obesity  - PCP follow up  Wyn Forster, MD Center for [facility], [facility]

## 2023-07-15 ENCOUNTER — Encounter: Payer: Self-pay | Admitting: Obstetrics

## 2023-07-15 ENCOUNTER — Ambulatory Visit: Payer: Medicaid Other | Admitting: Obstetrics

## 2023-07-15 VITALS — BP 122/81 | HR 67 | Wt 259.2 lb

## 2023-07-15 DIAGNOSIS — E669 Obesity, unspecified: Secondary | ICD-10-CM | POA: Diagnosis not present

## 2023-07-15 DIAGNOSIS — Z01419 Encounter for gynecological examination (general) (routine) without abnormal findings: Secondary | ICD-10-CM

## 2023-07-15 DIAGNOSIS — Z3009 Encounter for other general counseling and advice on contraception: Secondary | ICD-10-CM | POA: Diagnosis not present

## 2023-07-15 NOTE — Progress Notes (Signed)
 Patient ID: Megan Valenzuela, female   DOB: [birth year], 43 y.o.   MRN: [id number]  No chief complaint on file.   HPI Megan Valenzuela is a 43 y.o. female.  Complains of decreased sensation in clitoral area.  Had uncomplicated NSVD ~ 4 months ago. HPI  Past Medical History:  Diagnosis Date   Asthma    BV (bacterial vaginosis)    Dental abscess    Hepatitis C     Past Surgical History:  Procedure Laterality Date   TYMPANOSTOMY TUBE PLACEMENT      Family History  Problem Relation Age of Onset   Diabetes Mother    Hypertension Mother    Cancer Maternal Grandmother     Social History Social History   Tobacco Use   Smoking status: Former    Types: Cigarettes   Smokeless tobacco: Never  Vaping Use   Vaping status: Never Used  Substance Use Topics   Alcohol use: No   Drug use: No    Allergies  Allergen Reactions   Flagyl  [Metronidazole ] Other (See Comments)    headache   Keflex  [Cephalexin ] Other (See Comments)    Stomach Cramps Bruising   Latex Itching and Rash    Pt states that she has latex allergy. Pt states that she experiences rash and itching with latex. 03/15/2023 2100  Excell Alvine, RN    Current Outpatient Medications  Medication Sig Dispense Refill   Prenatal Vit-Fe Fumarate-FA (PRENATAL VITAMINS) 28-0.8 MG TABS Take 1 tablet by mouth daily.     No current facility-administered medications for this visit.    Review of Systems Review of Systems Constitutional: negative for fatigue and weight loss Respiratory: negative for cough and wheezing Cardiovascular: negative for chest pain, fatigue and palpitations Gastrointestinal: negative for abdominal pain and change in bowel habits Genitourinary: p[ositive for decreased sensation in clitoral area Integument/breast: negative for nipple discharge Musculoskeletal:negative for myalgias Neurological: negative for gait problems and tremors Behavioral/Psych: negative for abusive relationship,  depression Endocrine: negative for temperature intolerance      Blood pressure 122/81, pulse 67, weight 259 lb 3.2 oz (117.6 kg), currently breastfeeding.  Physical Exam Physical Exam General:   Alert and no distress  Skin:   no rash or abnormalities  Lungs:   clear to auscultation bilaterally  Heart:   regular rate and rhythm, S1, S2 normal, no murmur, click, rub or gallop  Breasts:   normal without suspicious masses, skin or nipple changes or axillary nodes  Abdomen:  normal findings: no organomegaly, soft, non-tender and no hernia  Pelvis:  External genitalia: normal general appearance Urinary system: urethral meatus normal and bladder without fullness, nontender Vaginal: normal without tenderness, induration or masses Cervix: normal appearance Adnexa: normal bimanual exam Uterus: anteverted and non-tender, normal size    I have spent a total of 20 minutes of face-to-face time, excluding clinical staff time, reviewing notes and preparing to see patient, ordering tests and/or medications, and counseling the patient.   Data Reviewed   Assessment     1. Encounter for gynecological examination (Primary)  2. Encounter for other general counseling or advice on contraception - declines contraception - wants to conceive in a year  3. Obesity (BMI 35.0-39.9 without comorbidity) - weight reduction with the aid of dietary changes, exercise and behavioral modification recommended     Plan   Follow up in 3 months    CARLIN RONAL CENTERS, MD, FACOG Attending Obstetrician & Gynecologist, [facility] for [employer], [facility],[place] 07/15/2023

## 2023-07-15 NOTE — Progress Notes (Signed)
 Pt. Presents for no periods since giving birth. Has questions about how her Megan Valenzuela looks and feels differently from before giving birth.
